# Patient Record
Sex: Female | Born: 1965
Health system: Southern US, Community
[De-identification: ages and names within clinical notes are randomized; demographics above are authoritative.]

## PROBLEM LIST (undated history)

## (undated) DIAGNOSIS — R32 Unspecified urinary incontinence: Secondary | ICD-10-CM

## (undated) DIAGNOSIS — I10 Essential (primary) hypertension: Secondary | ICD-10-CM

## (undated) DIAGNOSIS — G43909 Migraine, unspecified, not intractable, without status migrainosus: Secondary | ICD-10-CM

## (undated) DIAGNOSIS — E119 Type 2 diabetes mellitus without complications: Secondary | ICD-10-CM

## (undated) DIAGNOSIS — R51 Headache: Secondary | ICD-10-CM

## (undated) DIAGNOSIS — D509 Iron deficiency anemia, unspecified: Secondary | ICD-10-CM

## (undated) DIAGNOSIS — E7800 Pure hypercholesterolemia, unspecified: Secondary | ICD-10-CM

## (undated) DIAGNOSIS — H30033 Focal chorioretinal inflammation, peripheral, bilateral: Secondary | ICD-10-CM

## (undated) DIAGNOSIS — E0590 Thyrotoxicosis, unspecified without thyrotoxic crisis or storm: Secondary | ICD-10-CM

## (undated) HISTORY — DX: Pure hypercholesterolemia, unspecified: E78.00

## (undated) HISTORY — DX: Focal chorioretinal inflammation, peripheral, bilateral: H30.033

## (undated) HISTORY — DX: Iron deficiency anemia, unspecified: D50.9

## (undated) HISTORY — DX: Unspecified urinary incontinence: R32

## (undated) HISTORY — PX: COLONOSCOPY: SHX174

## (undated) HISTORY — DX: Headache: R51

## (undated) HISTORY — PX: APPENDECTOMY: SHX54

## (undated) HISTORY — DX: Essential (primary) hypertension: I10

## (undated) HISTORY — PX: ABDOMINAL HYSTERECTOMY: SHX81

## (undated) HISTORY — PX: TONSILLECTOMY AND ADENOIDECTOMY: SUR1326

## (undated) HISTORY — DX: Type 2 diabetes mellitus without complications: E11.9

## (undated) HISTORY — DX: Migraine, unspecified, not intractable, without status migrainosus: G43.909

---

## 1999-12-21 ENCOUNTER — Other Ambulatory Visit: Admission: RE | Admit: 1999-12-21 | Discharge: 1999-12-21 | Payer: Self-pay | Admitting: Obstetrics and Gynecology

## 2000-07-13 ENCOUNTER — Emergency Department (HOSPITAL_COMMUNITY): Admission: EM | Admit: 2000-07-13 | Discharge: 2000-07-13 | Payer: Self-pay | Admitting: Emergency Medicine

## 2000-07-13 ENCOUNTER — Encounter: Payer: Self-pay | Admitting: Emergency Medicine

## 2000-11-17 ENCOUNTER — Other Ambulatory Visit: Admission: RE | Admit: 2000-11-17 | Discharge: 2000-11-17 | Payer: Self-pay | Admitting: Obstetrics and Gynecology

## 2000-11-18 ENCOUNTER — Encounter: Admission: RE | Admit: 2000-11-18 | Discharge: 2000-11-18 | Payer: Self-pay

## 2000-11-18 ENCOUNTER — Encounter: Payer: Self-pay | Admitting: Obstetrics and Gynecology

## 2002-04-26 ENCOUNTER — Other Ambulatory Visit: Admission: RE | Admit: 2002-04-26 | Discharge: 2002-04-26 | Payer: Self-pay | Admitting: Family Medicine

## 2002-04-26 ENCOUNTER — Encounter: Admission: RE | Admit: 2002-04-26 | Discharge: 2002-04-26 | Payer: Self-pay | Admitting: Obstetrics and Gynecology

## 2002-05-04 ENCOUNTER — Ambulatory Visit (HOSPITAL_COMMUNITY): Admission: RE | Admit: 2002-05-04 | Discharge: 2002-05-04 | Payer: Self-pay | Admitting: Obstetrics and Gynecology

## 2002-05-10 ENCOUNTER — Encounter: Admission: RE | Admit: 2002-05-10 | Discharge: 2002-05-10 | Payer: Self-pay | Admitting: Obstetrics and Gynecology

## 2004-03-25 ENCOUNTER — Encounter: Admission: RE | Admit: 2004-03-25 | Discharge: 2004-03-25 | Payer: Self-pay | Admitting: Family Medicine

## 2004-05-15 ENCOUNTER — Other Ambulatory Visit: Admission: RE | Admit: 2004-05-15 | Discharge: 2004-05-15 | Payer: Self-pay | Admitting: Obstetrics and Gynecology

## 2004-08-05 ENCOUNTER — Ambulatory Visit: Payer: Self-pay | Admitting: Endocrinology

## 2004-08-07 ENCOUNTER — Emergency Department (HOSPITAL_COMMUNITY): Admission: EM | Admit: 2004-08-07 | Discharge: 2004-08-07 | Payer: Self-pay | Admitting: Emergency Medicine

## 2004-08-10 ENCOUNTER — Ambulatory Visit: Payer: Self-pay | Admitting: Internal Medicine

## 2005-02-05 ENCOUNTER — Ambulatory Visit: Payer: Self-pay | Admitting: Endocrinology

## 2005-02-09 ENCOUNTER — Ambulatory Visit: Payer: Self-pay | Admitting: Endocrinology

## 2005-02-11 ENCOUNTER — Ambulatory Visit: Payer: Self-pay | Admitting: Endocrinology

## 2005-04-07 ENCOUNTER — Ambulatory Visit: Payer: Self-pay

## 2005-06-11 ENCOUNTER — Other Ambulatory Visit: Admission: RE | Admit: 2005-06-11 | Discharge: 2005-06-11 | Payer: Self-pay | Admitting: Obstetrics and Gynecology

## 2005-06-18 ENCOUNTER — Encounter: Admission: RE | Admit: 2005-06-18 | Discharge: 2005-06-18 | Payer: Self-pay | Admitting: Obstetrics and Gynecology

## 2005-11-08 ENCOUNTER — Ambulatory Visit: Payer: Self-pay | Admitting: Endocrinology

## 2005-11-10 ENCOUNTER — Ambulatory Visit: Payer: Self-pay | Admitting: Endocrinology

## 2005-11-22 ENCOUNTER — Ambulatory Visit: Payer: Self-pay | Admitting: Internal Medicine

## 2005-11-25 ENCOUNTER — Ambulatory Visit: Payer: Self-pay | Admitting: Internal Medicine

## 2005-11-25 ENCOUNTER — Encounter (INDEPENDENT_AMBULATORY_CARE_PROVIDER_SITE_OTHER): Payer: Self-pay | Admitting: Specialist

## 2005-11-25 LAB — HM COLONOSCOPY

## 2006-06-21 ENCOUNTER — Other Ambulatory Visit: Admission: RE | Admit: 2006-06-21 | Discharge: 2006-06-21 | Payer: Self-pay | Admitting: Obstetrics and Gynecology

## 2006-09-05 ENCOUNTER — Ambulatory Visit: Payer: Self-pay | Admitting: Endocrinology

## 2006-11-02 ENCOUNTER — Ambulatory Visit: Payer: Self-pay | Admitting: Endocrinology

## 2006-11-02 LAB — CONVERTED CEMR LAB
Albumin: 3.8 g/dL (ref 3.5–5.2)
Alkaline Phosphatase: 47 units/L (ref 39–117)
BUN: 8 mg/dL (ref 6–23)
Basophils Absolute: 0.1 10*3/uL (ref 0.0–0.1)
Bilirubin Urine: NEGATIVE
Cholesterol: 143 mg/dL (ref 0–200)
Creatinine, Ser: 0.7 mg/dL (ref 0.4–1.2)
Creatinine,U: 222.7 mg/dL
Eosinophils Absolute: 0.1 10*3/uL (ref 0.0–0.6)
GFR calc Af Amer: 120 mL/min
HCT: 30.8 % — ABNORMAL LOW (ref 36.0–46.0)
HDL: 39 mg/dL (ref >39.0)
Hemoglobin, Urine: NEGATIVE
Hemoglobin: 10.2 g/dL — ABNORMAL LOW (ref 12.0–15.0)
Leukocytes, UA: NEGATIVE
Lymphocytes Relative: 38.6 % (ref 12.0–46.0)
MCHC: 33.1 g/dL (ref 30.0–36.0)
MCV: 78.4 fL (ref 78.0–100.0)
Microalb Creat Ratio: 3.6 mg/g (ref 0.0–30.0)
Microalb, Ur: 0.8 mg/dL (ref 0.0–1.9)
Monocytes Absolute: 0.4 10*3/uL (ref 0.2–0.7)
Monocytes Relative: 7.4 % (ref 3.0–11.0)
Neutro Abs: 2.8 10*3/uL (ref 1.4–7.7)
Neutrophils Relative %: 50.4 % (ref 43.0–77.0)
Potassium: 4.2 meq/L (ref 3.5–5.1)
RDW: 14.6 % (ref 11.5–14.6)
Sed Rate: 20 mm/hr (ref 0–25)
Sodium: 141 meq/L (ref 135–145)
TSH: 0.26 microintl units/mL — ABNORMAL LOW (ref 0.35–5.50)
Total Bilirubin: 0.8 mg/dL (ref 0.3–1.2)
Triglycerides: 90 mg/dL (ref 0–149)
Urobilinogen, UA: 0.2 (ref 0.0–1.0)
VLDL: 18 mg/dL (ref 0–40)
pH: 6 (ref 5.0–8.0)

## 2006-11-07 ENCOUNTER — Ambulatory Visit: Payer: Self-pay | Admitting: Endocrinology

## 2007-04-06 ENCOUNTER — Encounter: Payer: Self-pay | Admitting: Endocrinology

## 2007-04-06 DIAGNOSIS — I10 Essential (primary) hypertension: Secondary | ICD-10-CM | POA: Insufficient documentation

## 2007-04-06 DIAGNOSIS — E119 Type 2 diabetes mellitus without complications: Secondary | ICD-10-CM | POA: Insufficient documentation

## 2007-04-06 DIAGNOSIS — R32 Unspecified urinary incontinence: Secondary | ICD-10-CM | POA: Insufficient documentation

## 2007-04-06 DIAGNOSIS — E1169 Type 2 diabetes mellitus with other specified complication: Secondary | ICD-10-CM | POA: Insufficient documentation

## 2007-07-10 ENCOUNTER — Ambulatory Visit: Payer: Self-pay | Admitting: Endocrinology

## 2007-07-10 DIAGNOSIS — IMO0001 Reserved for inherently not codable concepts without codable children: Secondary | ICD-10-CM | POA: Insufficient documentation

## 2007-07-10 DIAGNOSIS — E0590 Thyrotoxicosis, unspecified without thyrotoxic crisis or storm: Secondary | ICD-10-CM | POA: Insufficient documentation

## 2007-07-11 ENCOUNTER — Telehealth (INDEPENDENT_AMBULATORY_CARE_PROVIDER_SITE_OTHER): Payer: Self-pay | Admitting: *Deleted

## 2007-07-11 LAB — CONVERTED CEMR LAB
Iron: 18 ug/dL — ABNORMAL LOW (ref 42–145)
TSH: 0.27 microintl units/mL — ABNORMAL LOW (ref 0.35–5.50)

## 2007-07-19 ENCOUNTER — Encounter: Payer: Self-pay | Admitting: Endocrinology

## 2008-03-19 ENCOUNTER — Ambulatory Visit: Payer: Self-pay | Admitting: Endocrinology

## 2008-03-19 DIAGNOSIS — R51 Headache: Secondary | ICD-10-CM | POA: Insufficient documentation

## 2008-03-19 DIAGNOSIS — R519 Headache, unspecified: Secondary | ICD-10-CM | POA: Insufficient documentation

## 2008-03-21 LAB — CONVERTED CEMR LAB: TSH: 0.54 microintl units/mL (ref 0.35–5.50)

## 2008-03-25 ENCOUNTER — Telehealth (INDEPENDENT_AMBULATORY_CARE_PROVIDER_SITE_OTHER): Payer: Self-pay | Admitting: *Deleted

## 2008-10-11 ENCOUNTER — Ambulatory Visit: Payer: Self-pay | Admitting: Endocrinology

## 2009-02-13 ENCOUNTER — Encounter: Admission: RE | Admit: 2009-02-13 | Discharge: 2009-02-13 | Payer: Self-pay | Admitting: Endocrinology

## 2009-03-21 ENCOUNTER — Ambulatory Visit: Payer: Self-pay | Admitting: Internal Medicine

## 2009-03-21 ENCOUNTER — Encounter (INDEPENDENT_AMBULATORY_CARE_PROVIDER_SITE_OTHER): Payer: Self-pay | Admitting: *Deleted

## 2009-03-21 LAB — CONVERTED CEMR LAB
ALT: 18 units/L (ref 0–35)
AST: 17 units/L (ref 0–37)
Alkaline Phosphatase: 45 units/L (ref 39–117)
Basophils Relative: 1 % (ref 0.0–3.0)
Bilirubin, Direct: 0.1 mg/dL (ref 0.0–0.3)
Blood Glucose, Fasting: 147 mg/dL
Calcium: 9.7 mg/dL (ref 8.4–10.5)
Chloride: 104 meq/L (ref 96–112)
Creatinine, Ser: 1 mg/dL (ref 0.4–1.2)
Eosinophils Relative: 2 % (ref 0.0–5.0)
Hemoglobin: 11.5 g/dL — ABNORMAL LOW (ref 12.0–15.0)
Lymphocytes Relative: 25.3 % (ref 12.0–46.0)
Magnesium: 2.3 mg/dL (ref 1.5–2.5)
Monocytes Relative: 7.1 % (ref 3.0–12.0)
Neutro Abs: 4 10*3/uL (ref 1.4–7.7)
Neutrophils Relative %: 64.6 % (ref 43.0–77.0)
RBC: 4.59 M/uL (ref 3.87–5.11)
Sodium: 138 meq/L (ref 135–145)
Specific Gravity, Urine: 1.02 (ref 1.000–1.030)
Total Protein, Urine: NEGATIVE mg/dL
Total Protein: 8.1 g/dL (ref 6.0–8.3)
Urine Glucose: NEGATIVE mg/dL
Urobilinogen, UA: 0.2 (ref 0.0–1.0)
WBC: 6.1 10*3/uL (ref 4.5–10.5)
pH: 7 (ref 5.0–8.0)

## 2010-04-03 ENCOUNTER — Encounter: Admission: RE | Admit: 2010-04-03 | Discharge: 2010-04-03 | Payer: Self-pay | Admitting: Obstetrics and Gynecology

## 2010-07-30 ENCOUNTER — Encounter: Payer: Self-pay | Admitting: Endocrinology

## 2010-07-30 ENCOUNTER — Ambulatory Visit: Payer: Self-pay | Admitting: Endocrinology

## 2010-07-30 DIAGNOSIS — D509 Iron deficiency anemia, unspecified: Secondary | ICD-10-CM | POA: Insufficient documentation

## 2010-07-30 DIAGNOSIS — E7800 Pure hypercholesterolemia, unspecified: Secondary | ICD-10-CM | POA: Insufficient documentation

## 2010-07-30 LAB — CONVERTED CEMR LAB
Basophils Relative: 1.2 % (ref 0.0–3.0)
Chloride: 104 meq/L (ref 96–112)
Cholesterol: 200 mg/dL (ref 0–200)
Eosinophils Relative: 1.8 % (ref 0.0–5.0)
HCT: 32.6 % — ABNORMAL LOW (ref 36.0–46.0)
HDL: 37.4 mg/dL — ABNORMAL LOW (ref >39.00)
Hgb A1c MFr Bld: 6.6 % — ABNORMAL HIGH (ref 4.6–6.5)
Lymphs Abs: 2.1 10*3/uL (ref 0.7–4.0)
MCHC: 34.3 g/dL (ref 30.0–36.0)
MCV: 78.6 fL (ref 78.0–100.0)
Monocytes Absolute: 0.6 10*3/uL (ref 0.1–1.0)
Nitrite: NEGATIVE
Platelets: 327 10*3/uL (ref 150.0–400.0)
Potassium: 4 meq/L (ref 3.5–5.1)
RBC: 4.14 M/uL (ref 3.87–5.11)
Sed Rate: 16 mm/hr (ref 0–22)
Specific Gravity, Urine: 1.01 (ref 1.000–1.030)
TSH: 0.51 microintl units/mL (ref 0.35–5.50)
Total Protein, Urine: NEGATIVE mg/dL
WBC: 7 10*3/uL (ref 4.5–10.5)
pH: 5 (ref 5.0–8.0)

## 2010-09-19 ENCOUNTER — Encounter: Payer: Self-pay | Admitting: Obstetrics and Gynecology

## 2010-10-01 NOTE — Progress Notes (Signed)
°  Medications Added ATENOLOL-CHLORTHALIDONE 50-25 MG TABS (ATENOLOL-CHLORTHALIDONE) Take 1/2 tablet by mouth once a day       Phone Note Call from Patient Call back at Home Phone [phone] Call back at Work Phone [phone] Call back at call [phone]   Caller: Patient Call For: dr Everardo All Summary of Call: per pt heard her lab results  on phone tree  which were fine ,but need to know what her a1c levels were per pt.Patient's chart has been  requested.    Initial call taken by: Shelbie Proctor,  July 11, 2007 10:40 AM  Follow-up for Phone Call        a1c=6.2 please continue same metformin Follow-up by: Minus Breeding MD,  July 11, 2007 12:34 PM  Additional Follow-up for Phone Call Additional follow up Details #1::        called pt  to inform left msg to call.    Additional Follow-up by: Shelbie Proctor,  July 11, 2007 1:19 PM    Additional Follow-up for Phone Call Additional follow up Details #2::    July 11, 2007 1:26 PM pt called back spoke with pt made aware what her a1c levels were  Follow-up by: Shelbie Proctor,  July 11, 2007 1:27 PM  New/Updated Medications: ATENOLOL-CHLORTHALIDONE 50-25 MG TABS (ATENOLOL-CHLORTHALIDONE) Take 1/2 tablet by mouth once a day

## 2010-10-01 NOTE — Assessment & Plan Note (Signed)
Summary: FU ON BP AND DM/NWS #   Vital Signs:  Patient profile:   45 year old female Height:      66 inches (167.64 cm) Weight:      180 pounds (81.82 kg) BMI:     29.16 O2 Sat:      97 % on Room air Temp:     97.4 degrees F (36.33 degrees C) oral Pulse rate:   80 / minute BP sitting:   124 / 82  (left arm) Cuff size:   regular  Vitals Entered By: Brenton Grills CMA Duncan Dull) (July 30, 2010 10:34 AM)  O2 Flow:  Room air CC: Follow up on DM and BP/aj Is Patient Diabetic? Yes   Primary Karalyne Nusser:  Minus Breeding MD  CC:  Follow up on DM and BP/aj.  History of Present Illness: the status of at least 3 ongoing medical problems is addressed today: headache: this has recurred off ccb.  she says it started overall approx 10-11 years ago, but it is worse recently.  dm:  pt intermittently takes her metformin.  she reports weight gain htn:  denies sob.  she takes hctz as rx'ed.   anemia:  she reports heavy menses  Current Medications (verified): 1)  Metformin Hcl 1000 Mg  Tabs (Metformin Hcl) .... Take 1 By Mouth Qd 2)  Loestrin 1/20 (21) 1-20 Mg-Mcg Tabs (Norethindrone Acet-Ethinyl Est) .Marland Kitchen.. 1 Qd 3)  Hydrochlorothiazide 25 Mg Tabs (Hydrochlorothiazide) .... One By Mouth Once Daily For High Blood Pressure  Allergies (verified): No Known Drug Allergies  Past History:  Past Medical History: Last updated: 04/06/2007 Diabetes mellitus, type II Hypertension Urinary incontinence Headaches Bloody stools  Family History: Reviewed history and no changes required. dm: both parents cancer father had colon cancer  Social History: Reviewed history from 03/21/2009 and no changes required. Occupation: works Engineer, site Married Never Smoked Alcohol use-no Drug use-no Regular exercise-yes  Review of Systems  The patient denies vision loss and chest pain.    Physical Exam  General:  normal appearance.   Head:  head: no deformity eyes: no periorbital swelling, no  proptosis external nose and ears are normal mouth: no lesion seen Ears:  TM's intact and clear with normal canals with grossly normal hearing.   Neck:  Supple without thyroid enlargement or tenderness.  Lungs:  Clear to auscultation bilaterally. Normal respiratory effort.  Heart:  Regular rate and rhythm without murmurs or gallops noted. Normal S1,S2.   Abdomen:  abdomen is soft, nontender.  no hepatosplenomegaly.   not distended.  no hernia  Pulses:  dorsalis pedis intact bilat.  no carotid bruit  Extremities:  no deformity.  no ulcer on the feet.  feet are of normal color and temp.  no edema  Neurologic:  cn 2-12 grossly intact.   readily moves all 4's.   sensation is intact to touch on the feet  Psych:  Alert and cooperative; normal mood and affect; normal attention span and concentration.   Additional Exam:  Hemoglobin A1C       [H]  6.6 %  LDL Cholesterol      [H]  272 mg/dL    Hemoglobin           [L]  11.2 g/dL                   53.6-64.4 Hematocrit           [L]  32.6 %      Iron Saturation      [  L]  10.2 %          Impression & Recommendations:  Problem # 1:  HEADACHE (ICD-784.0) Assessment Deteriorated  Problem # 2:  DIABETES MELLITUS, TYPE II (ICD-250.00) needs increased rx  Problem # 3:  ANEMIA, IRON DEFICIENCY (ICD-280.9) needs increased rx  Problem # 4:  HYPERTENSION (ICD-401.9) well-controlled  Problem # 5:  HYPERCHOLESTEROLEMIA (ICD-272.0) needs increased rx  Medications Added to Medication List This Visit: 1)  Metformin Hcl 1000 Mg Tabs (Metformin hcl) .... Take 1 by mouth two times a day  Other Orders: EKG w/ Interpretation (93000) Neurology Referral (Neuro) TLB-A1C / Hgb A1C (Glycohemoglobin) (83036-A1C) TLB-Lipid Panel (80061-LIPID) TLB-CBC Platelet - w/Differential (85025-CBCD) TLB-IBC Pnl (Iron/FE;Transferrin) (83550-IBC) TLB-B12, Serum-Total ONLY (04540-J81) TLB-TSH (Thyroid Stimulating Hormone) (84443-TSH) TLB-Sedimentation Rate (ESR)  (85652-ESR) TLB-BMP (Basic Metabolic Panel-BMET) (80048-METABOL) TLB-Udip w/ Micro (81001-URINE) Est. Patient Level IV (19147)  Preventive Care Screening     gyn is [facility]   Patient Instructions: 1)  blood tests are being ordered for you today.  please call [phone] to hear your test results. 2)  refer to a headache specialist.  you will be called with a day and time for an appointment. 3)  Please schedule a physical appointment in 6 months. 4)  cc labs to [facility] 5)  (update: i left message on phone-tree:  increase metformin to 1000 mg two times a day.  take fe 1/day.  you should take chol med.  let me know if you want fx) Prescriptions: METFORMIN HCL 1000 MG  TABS (METFORMIN HCL) take 1 by mouth two times a day  #60 x 11   Entered and Authorized by:   Minus Breeding MD   Signed by:   Minus Breeding MD on 07/31/2010   Method used:   Electronically to        [facility]* (retail)       [address]       [place], [place]  [address]       Ph: [phone]       Fax: [phone]   RxID:   [id number] HYDROCHLOROTHIAZIDE 25 MG TABS (HYDROCHLOROTHIAZIDE) One by mouth once daily for high blood pressure  #30 x 5   Entered by:   Brenton Grills CMA (AAMA)   Authorized by:   Minus Breeding MD   Signed by:   Brenton Grills CMA (AAMA) on 07/30/2010   Method used:   Electronically to        [facility]* (retail)       [address]       [place], [place]  [id number]       Ph: [phone]       Fax: [phone]   RxID:   [id number] METFORMIN HCL 1000 MG  TABS (METFORMIN HCL) take 1 by mouth qd  #30 x 5   Entered by:   Brenton Grills CMA (AAMA)   Authorized by:   Minus Breeding MD   Signed by:   Brenton Grills CMA (AAMA) on 07/30/2010   Method used:   Electronically to        [facility]* (retail)       [address]       [place], [place]  [id number]       Ph: [phone]        Fax: [phone]   RxID:   [id number]    Orders Added: 1)  EKG w/ Interpretation [  93000] 2)  Neurology Referral [Neuro] 3)  TLB-A1C / Hgb A1C (Glycohemoglobin) [83036-A1C] 4)  TLB-Lipid Panel [80061-LIPID] 5)  TLB-CBC Platelet - w/Differential [85025-CBCD] 6)  TLB-IBC Pnl (Iron/FE;Transferrin) [83550-IBC] 7)  TLB-B12, Serum-Total ONLY [82607-B12] 8)  TLB-TSH (Thyroid Stimulating Hormone) [84443-TSH] 9)  TLB-Sedimentation Rate (ESR) [85652-ESR] 10)  TLB-BMP (Basic Metabolic Panel-BMET) [80048-METABOL] 11)  TLB-Udip w/ Micro [81001-URINE] 12)  Est. Patient Level IV [16109]     Preventive Care Screening     gyn is [facility]

## 2011-01-28 ENCOUNTER — Encounter: Payer: Self-pay | Admitting: Endocrinology

## 2011-01-28 DIAGNOSIS — Z0289 Encounter for other administrative examinations: Secondary | ICD-10-CM

## 2011-06-08 ENCOUNTER — Other Ambulatory Visit (INDEPENDENT_AMBULATORY_CARE_PROVIDER_SITE_OTHER): Payer: BC Managed Care – PPO

## 2011-06-08 ENCOUNTER — Ambulatory Visit (INDEPENDENT_AMBULATORY_CARE_PROVIDER_SITE_OTHER): Payer: BC Managed Care – PPO | Admitting: Endocrinology

## 2011-06-08 ENCOUNTER — Encounter: Payer: Self-pay | Admitting: Endocrinology

## 2011-06-08 DIAGNOSIS — D509 Iron deficiency anemia, unspecified: Secondary | ICD-10-CM

## 2011-06-08 DIAGNOSIS — E119 Type 2 diabetes mellitus without complications: Secondary | ICD-10-CM

## 2011-06-08 LAB — CBC WITH DIFFERENTIAL/PLATELET
Basophils Absolute: 0.1 10*3/uL (ref 0.0–0.1)
Eosinophils Relative: 1.5 % (ref 0.0–5.0)
HCT: 34.2 % — ABNORMAL LOW (ref 36.0–46.0)
Lymphs Abs: 2.5 10*3/uL (ref 0.7–4.0)
MCV: 74.8 fl — ABNORMAL LOW (ref 78.0–100.0)
Monocytes Absolute: 0.5 10*3/uL (ref 0.1–1.0)
Neutrophils Relative %: 57.3 % (ref 43.0–77.0)
Platelets: 369 10*3/uL (ref 150.0–400.0)
RDW: 17.9 % — ABNORMAL HIGH (ref 11.5–14.6)
WBC: 7.3 10*3/uL (ref 4.5–10.5)

## 2011-06-08 LAB — HEMOGLOBIN A1C: Hgb A1c MFr Bld: 6.4 % (ref 4.6–6.5)

## 2011-06-08 MED ORDER — HYDROCHLOROTHIAZIDE 25 MG PO TABS: 25.0000 mg | ORAL_TABLET | Freq: Every day | ORAL | Status: DC

## 2011-06-08 NOTE — Patient Instructions (Addendum)
Resume hctz for blood pressure. Please come back for a regular physical appointment in 3 months. blood tests are being requested for you today.  please call [phone] to hear your test results.  You will be prompted to enter the 9-digit "MRN" number that appears at the top left of this page, followed by #.  Then you will hear the message. (update: i left message on phone-tree:  Take fe 2/d).

## 2011-06-08 NOTE — Progress Notes (Signed)
  Subjective:    Patient ID: Michelle Castillo, female    DOB: 1966-04-01, 45 y.o.   MRN: [id number]  HPI The state of at least three ongoing medical problems is addressed today: DM: denies chest pain.  no cbg record, but states cbg's are well-controlled Htn: pt says she ran out of her med approx 1 week ago.  Denies sob Anemia: Menses are not as heavy recently.  Gyn is dr Ginette Otto [facility]. Past Medical History  Diagnosis Date  . Unspecified disorder of thyroid 07/10/2007  . DIABETES MELLITUS, TYPE II 04/06/2007  . HYPERCHOLESTEROLEMIA 07/30/2010  . ANEMIA, IRON DEFICIENCY 07/30/2010  . HYPERTENSION 04/06/2007  . Headache 03/19/2008  . URINARY INCONTINENCE 04/06/2007  . Blood in stool     Past Surgical History  Procedure Date  . Cesarean section 1998  . Tonsillectomy and adenoidectomy     History   Social History  . Marital Status: Married    Spouse Name: N/A    Number of Children: N/A  . Years of Education: N/A   Occupational History  . Social Services    Social History Main Topics  . Smoking status: Never Smoker   . Smokeless tobacco: Not on file  . Alcohol Use: No  . Drug Use: No  . Sexually Active:    Other Topics Concern  . Not on file   Social History Narrative   Occupation: Works Barrister's clerk exercise-yes    Current Outpatient Prescriptions on File Prior to Visit  Medication Sig Dispense Refill  . metFORMIN (GLUCOPHAGE) 1000 MG tablet Take 1,000 mg by mouth 2 (two) times daily with a meal.        . norethindrone-ethinyl estradiol (LOESTRIN 1/20, 21,) 1-20 MG-MCG per tablet Take 1 tablet by mouth daily.          No Known Allergies  Family History  Problem Relation Age of Onset  . Diabetes Mother   . Diabetes Father   . Cancer Father     Colon Cancer    BP 112/78  Pulse 89  Temp(Src) 98.6 F (37 C) (Oral)  Ht 5\' 7"  (1.702 m)  Wt 173 lb (78.472 kg)  BMI 27.10 kg/m2  SpO2 98%  LMP 05/31/2011  Review of Systems Denies  hematuria, and brbpr.      Objective:   Physical Exam VITAL SIGNS:  See vs page GENERAL: no distress Pulses: dorsalis pedis intact bilat.   Feet: no deformity.  no ulcer on the feet.  feet are of normal color and temp.  no edema Neuro: sensation is intact to touch on the feet   Lab Results  Component Value Date   WBC 7.3 06/08/2011   HGB 11.2* 06/08/2011   HCT 34.2* 06/08/2011   MCV 74.8* 06/08/2011   PLT 369.0 06/08/2011  fe is low Lab Results  Component Value Date   HGBA1C 6.4 06/08/2011       Assessment & Plan:  iron-deficiency anemia, needs increased rx Htn, well-controlled.  The effect of hctz persists, so she needs it Dm, well-controlled

## 2011-06-09 LAB — IBC PANEL
Iron: 25 ug/dL — ABNORMAL LOW (ref 42–145)
Saturation Ratios: 5.6 % — ABNORMAL LOW (ref 20.0–50.0)

## 2012-05-25 ENCOUNTER — Other Ambulatory Visit: Payer: Self-pay | Admitting: *Deleted

## 2012-05-25 MED ORDER — METFORMIN HCL 1000 MG PO TABS: 1000.0000 mg | ORAL_TABLET | Freq: Two times a day (BID) | ORAL | Status: DC

## 2012-05-25 NOTE — Telephone Encounter (Signed)
R'cd fax from [facility] for refill of Metformin  

## 2012-06-23 ENCOUNTER — Encounter: Payer: Self-pay | Admitting: Endocrinology

## 2012-06-23 ENCOUNTER — Ambulatory Visit (INDEPENDENT_AMBULATORY_CARE_PROVIDER_SITE_OTHER): Payer: BC Managed Care – PPO | Admitting: Endocrinology

## 2012-06-23 VITALS — BP 120/80 | HR 80 | Temp 98.0°F | Resp 16 | Ht 68.0 in | Wt 179.2 lb

## 2012-06-23 DIAGNOSIS — E7800 Pure hypercholesterolemia, unspecified: Secondary | ICD-10-CM

## 2012-06-23 DIAGNOSIS — I10 Essential (primary) hypertension: Secondary | ICD-10-CM

## 2012-06-23 DIAGNOSIS — Z0000 Encounter for general adult medical examination without abnormal findings: Secondary | ICD-10-CM

## 2012-06-23 DIAGNOSIS — R32 Unspecified urinary incontinence: Secondary | ICD-10-CM

## 2012-06-23 DIAGNOSIS — E119 Type 2 diabetes mellitus without complications: Secondary | ICD-10-CM

## 2012-06-23 DIAGNOSIS — Z79899 Other long term (current) drug therapy: Secondary | ICD-10-CM

## 2012-06-23 DIAGNOSIS — E079 Disorder of thyroid, unspecified: Secondary | ICD-10-CM

## 2012-06-23 DIAGNOSIS — D509 Iron deficiency anemia, unspecified: Secondary | ICD-10-CM

## 2012-06-23 DIAGNOSIS — Z119 Encounter for screening for infectious and parasitic diseases, unspecified: Secondary | ICD-10-CM

## 2012-06-23 LAB — CBC WITH DIFFERENTIAL/PLATELET
Basophils Absolute: 0.1 10*3/uL (ref 0.0–0.1)
Basophils Relative: 2 % — ABNORMAL HIGH (ref 0–1)
HCT: 31 % — ABNORMAL LOW (ref 36.0–46.0)
Hemoglobin: 9.9 g/dL — ABNORMAL LOW (ref 12.0–15.0)
Lymphocytes Relative: 33 % (ref 12–46)
MCHC: 31.9 g/dL (ref 30.0–36.0)
Monocytes Absolute: 0.3 10*3/uL (ref 0.1–1.0)
Monocytes Relative: 6 % (ref 3–12)
Neutro Abs: 2.7 10*3/uL (ref 1.7–7.7)
Neutrophils Relative %: 56 % (ref 43–77)
RDW: 16 % — ABNORMAL HIGH (ref 11.5–15.5)
WBC: 4.8 10*3/uL (ref 4.0–10.5)

## 2012-06-23 LAB — HEPATIC FUNCTION PANEL
Alkaline Phosphatase: 41 U/L (ref 39–117)
Indirect Bilirubin: 0.3 mg/dL (ref 0.0–0.9)
Total Bilirubin: 0.4 mg/dL (ref 0.3–1.2)

## 2012-06-23 LAB — IBC PANEL
%SAT: 4 % — ABNORMAL LOW (ref 20–55)
TIBC: 420 ug/dL (ref 250–470)

## 2012-06-23 LAB — LIPID PANEL
Cholesterol: 202 mg/dL — ABNORMAL HIGH (ref 0–200)
Triglycerides: 90 mg/dL (ref <150)

## 2012-06-23 LAB — BASIC METABOLIC PANEL
CO2: 28 mEq/L (ref 19–32)
Chloride: 105 mEq/L (ref 96–112)
Potassium: 4 mEq/L (ref 3.5–5.3)
Sodium: 138 mEq/L (ref 135–145)

## 2012-06-23 LAB — HEMOGLOBIN A1C: Mean Plasma Glucose: 148 mg/dL — ABNORMAL HIGH (ref <117)

## 2012-06-23 LAB — TSH: TSH: 0.629 u[IU]/mL (ref 0.350–4.500)

## 2012-06-23 LAB — IRON: Iron: 18 ug/dL — ABNORMAL LOW (ref 42–145)

## 2012-06-23 MED ORDER — METFORMIN HCL ER 500 MG PO TB24: 1000.0000 mg | ORAL_TABLET | Freq: Two times a day (BID) | ORAL | Status: DC

## 2012-06-23 NOTE — Progress Notes (Signed)
Subjective:    Patient ID: Michelle Castillo, female    DOB: 1965/11/03, 46 y.o.   MRN: [id number]  HPI here for regular wellness examination.  she's feeling pretty well in general, and says chronic med probs are stable, except as noted below.   Past Medical History  Diagnosis Date  . Unspecified disorder of thyroid 07/10/2007  . DIABETES MELLITUS, TYPE II 04/06/2007  . HYPERCHOLESTEROLEMIA 07/30/2010  . ANEMIA, IRON DEFICIENCY 07/30/2010  . HYPERTENSION 04/06/2007  . Headache 03/19/2008  . URINARY INCONTINENCE 04/06/2007  . Blood in stool     Past Surgical History  Procedure Date  . Cesarean section 1998  . Tonsillectomy and adenoidectomy     History   Social History  . Marital Status: Divorced    Spouse Name: N/A    Number of Children: N/A  . Years of Education: N/A   Occupational History  . Social Services    Social History Main Topics  . Smoking status: Never Smoker   . Smokeless tobacco: Not on file  . Alcohol Use: No  . Drug Use: No  . Sexually Active:    Other Topics Concern  . Not on file   Social History Narrative   Occupation: Works Barrister's clerk exercise-yes    Current Outpatient Prescriptions on File Prior to Visit  Medication Sig Dispense Refill  . hydrochlorothiazide (HYDRODIURIL) 25 MG tablet Take 1 tablet (25 mg total) by mouth daily. For high blood pressure  90 tablet  3  . metFORMIN (GLUCOPHAGE) 1000 MG tablet Take 1 tablet (1,000 mg total) by mouth 2 (two) times daily with a meal.  60 tablet  2  . norethindrone-ethinyl estradiol (LOESTRIN 1/20, 21,) 1-20 MG-MCG per tablet Take 1 tablet by mouth daily.          No Known Allergies  Family History  Problem Relation Age of Onset  . Diabetes Mother   . Diabetes Father   . Cancer Father     Colon Cancer    BP 120/80  Pulse 80  Temp 98 F (36.7 C) (Oral)  Resp 16  Ht 5\' 8"  (1.727 m)  Wt 179 lb 3 oz (81.279 kg)  BMI 27.25 kg/m2  SpO2 99%  LMP 06/16/2012  Review of Systems    Constitutional: Negative for fever.       She reports weight gain  HENT: Negative for hearing loss.   Eyes: Negative for visual disturbance.  Respiratory: Negative for shortness of breath.   Cardiovascular: Negative for chest pain.  Genitourinary: Negative for hematuria.  Musculoskeletal: Positive for back pain.  Skin: Negative for rash.  Neurological: Negative for syncope.  Psychiatric/Behavioral: Negative for dysphoric mood.      Objective:   Physical Exam VS: see vs page GEN: no distress HEAD: head: no deformity eyes: no periorbital swelling, no proptosis external nose and ears are normal mouth: no lesion seen NECK: supple, thyroid is not enlarged CHEST WALL: no deformity LUNGS:  Clear to auscultation BREASTS:  sees gyn. CV: reg rate and rhythm, no murmur ABD: abdomen is soft, nontender.  no hepatosplenomegaly.  not distended.  no hernia GENITALIA/RECTAL: sees gyn MUSCULOSKELETAL: muscle bulk and strength are grossly normal.  no obvious joint swelling.  gait is normal and steady EXTEMITIES: no deformity.  no ulcer on the feet.  feet are of normal color and temp.  no edema PULSES: dorsalis pedis intact bilat.  no carotid bruit NEURO:  cn 2-12 grossly intact.   readily moves all 4's.  sensation is  intact to touch on the feet SKIN:  Normal texture and temperature.  No rash or suspicious lesion is visible.   NODES:  None palpable at the neck PSYCH: alert, oriented x3.  Does not appear anxious nor depressed.     Assessment & Plan:  Wellness visit today, with problems stable, except as noted.     SEPARATE EVALUATION FOLLOWS--EACH PROBLEM HERE IS NEW, NOT RESPONDING TO TREATMENT, OR POSES SIGNIFICANT RISK TO THE PATIENT'S HEALTH: HISTORY OF THE PRESENT ILLNESS: Dyslipidemia: denies weight change Anemia: denies brbpr PAST MEDICAL HISTORY reviewed and up to date today REVIEW OF SYSTEMS: Denies numbness PHYSICAL EXAMINATION: VITAL SIGNS:  See vs page Pulses: dorsalis  pedis intact bilat.  no carotid bruit GENERAL: no distress LAB/XRAY RESULTS: Lab Results  Component Value Date   WBC 4.8 06/23/2012   HGB 9.9* 06/23/2012   HCT 31.0* 06/23/2012   PLT 360 06/23/2012   GLUCOSE 115* 06/23/2012   CHOL 202* 06/23/2012   TRIG 90 06/23/2012   HDL 44 06/23/2012   LDLCALC 140* 06/23/2012   ALT 13 06/23/2012   AST 14 06/23/2012   NA 138 06/23/2012   K 4.0 06/23/2012   CL 105 06/23/2012   CREATININE 0.87 06/23/2012   BUN 8 06/23/2012   CO2 28 06/23/2012   TSH 0.629 06/23/2012   HGBA1C 6.8* 06/23/2012   MICROALBUR 1.26 06/23/2012  IMPRESSION: fe-deficiency anemia, needs increased rx Dyslipidemia, needs increased rx PLAN: See instruction page

## 2012-06-23 NOTE — Patient Instructions (Addendum)
please consider these measures for your health:  minimize alcohol.  do not use tobacco products.  have a colonoscopy at least every 10 years from age 44.  Women should have an annual mammogram from age 27.  keep firearms safely stored.  always use seat belts.  have working smoke alarms in your home.  see an eye doctor and dentist regularly.  never drive under the influence of alcohol or drugs (including prescription drugs).   blood tests are being requested for you today.  You will be contacted with results. Please return in 1 year. good diet and exercise habits significanly improve the control of your diabetes.  please let me know if you wish to be referred to a dietician.  high blood sugar is very risky to your health.  you should see an eye doctor every year.  You are at higher than average risk for pneumonia and hepatitis-B.  You should be vaccinated against both.

## 2012-06-24 LAB — URINALYSIS, MICROSCOPIC ONLY
Bacteria, UA: NONE SEEN
Casts: NONE SEEN
Crystals: NONE SEEN

## 2012-06-24 LAB — MICROALBUMIN / CREATININE URINE RATIO
Creatinine, Urine: 207.4 mg/dL
Microalb Creat Ratio: 6.1 mg/g (ref 0.0–30.0)

## 2012-06-24 LAB — URINALYSIS, ROUTINE W REFLEX MICROSCOPIC
Bilirubin Urine: NEGATIVE
Leukocytes, UA: NEGATIVE
Protein, ur: NEGATIVE mg/dL
Specific Gravity, Urine: 1.014 (ref 1.005–1.030)
Urobilinogen, UA: 0.2 mg/dL (ref 0.0–1.0)

## 2012-07-21 ENCOUNTER — Other Ambulatory Visit: Payer: Self-pay | Admitting: Obstetrics and Gynecology

## 2012-07-21 ENCOUNTER — Other Ambulatory Visit: Payer: Self-pay | Admitting: Certified Nurse Midwife

## 2012-07-21 DIAGNOSIS — Z1231 Encounter for screening mammogram for malignant neoplasm of breast: Secondary | ICD-10-CM

## 2012-08-01 ENCOUNTER — Other Ambulatory Visit: Payer: Self-pay | Admitting: Neurology

## 2012-08-01 MED ORDER — HYDROCHLOROTHIAZIDE 25 MG PO TABS: 25.0000 mg | ORAL_TABLET | Freq: Every day | ORAL | Status: DC

## 2012-08-17 ENCOUNTER — Ambulatory Visit: Payer: BC Managed Care – PPO

## 2012-08-22 ENCOUNTER — Other Ambulatory Visit: Payer: Self-pay | Admitting: Obstetrics and Gynecology

## 2012-11-28 ENCOUNTER — Other Ambulatory Visit: Payer: Self-pay

## 2012-11-28 DIAGNOSIS — Z1231 Encounter for screening mammogram for malignant neoplasm of breast: Secondary | ICD-10-CM

## 2013-01-04 ENCOUNTER — Ambulatory Visit
Admission: RE | Admit: 2013-01-04 | Discharge: 2013-01-04 | Disposition: A | Discharge status: Home / Self Care | Payer: 59 | Source: Ambulatory Visit

## 2013-01-04 DIAGNOSIS — Z1231 Encounter for screening mammogram for malignant neoplasm of breast: Secondary | ICD-10-CM

## 2013-03-15 ENCOUNTER — Ambulatory Visit (INDEPENDENT_AMBULATORY_CARE_PROVIDER_SITE_OTHER): Payer: 59 | Admitting: Endocrinology

## 2013-03-15 ENCOUNTER — Encounter: Payer: Self-pay | Admitting: Endocrinology

## 2013-03-15 VITALS — BP 134/76 | HR 90 | Ht 66.0 in | Wt 176.0 lb

## 2013-03-15 DIAGNOSIS — M25569 Pain in unspecified knee: Secondary | ICD-10-CM

## 2013-03-15 DIAGNOSIS — Z0000 Encounter for general adult medical examination without abnormal findings: Secondary | ICD-10-CM

## 2013-03-15 DIAGNOSIS — E119 Type 2 diabetes mellitus without complications: Secondary | ICD-10-CM

## 2013-03-15 NOTE — Progress Notes (Signed)
  Subjective:    Patient ID: Michelle Castillo, female    DOB: 1966/01/24, 47 y.o.   MRN: [id number]  HPI Pt states few mos of slight pain at the rectum, but no assoc brbpr. Pt returns for f/u of type 2 DM (dx'ed 2005; she has mild if any neuropathy of the lower extremities; no assoc chronic complications). Past Medical History  Diagnosis Date  . Unspecified disorder of thyroid 07/10/2007  . DIABETES MELLITUS, TYPE II 04/06/2007  . HYPERCHOLESTEROLEMIA 07/30/2010  . ANEMIA, IRON DEFICIENCY 07/30/2010  . HYPERTENSION 04/06/2007  . Headache(784.0) 03/19/2008  . URINARY INCONTINENCE 04/06/2007  . Blood in stool     Past Surgical History  Procedure Laterality Date  . Cesarean section  1998  . Tonsillectomy and adenoidectomy      History   Social History  . Marital Status: Divorced    Spouse Name: N/A    Number of Children: N/A  . Years of Education: N/A   Occupational History  . Social Services    Social History Main Topics  . Smoking status: Never Smoker   . Smokeless tobacco: Not on file  . Alcohol Use: No  . Drug Use: No  . Sexually Active:    Other Topics Concern  . Not on file   Social History Narrative   Occupation: Works Engineer, site   Regular exercise-yes          Current Outpatient Prescriptions on File Prior to Visit  Medication Sig Dispense Refill  . hydrochlorothiazide (HYDRODIURIL) 25 MG tablet Take 1 tablet (25 mg total) by mouth daily. For high blood pressure  90 tablet  3  . metFORMIN (GLUCOPHAGE-XR) 500 MG 24 hr tablet Take 2 tablets (1,000 mg total) by mouth 2 (two) times daily.  120 tablet  11  . norethindrone-ethinyl estradiol (LOESTRIN 1/20, 21,) 1-20 MG-MCG per tablet Take 1 tablet by mouth daily.         No current facility-administered medications on file prior to visit.   No Known Allergies  Family History  Problem Relation Age of Onset  . Diabetes Mother   . Diabetes Father   . Cancer Father     Colon Cancer   BP 134/76  Pulse 90  Ht 5'  6" (1.676 m)  Wt 176 lb (79.833 kg)  BMI 28.42 kg/m2  SpO2 98%  Review of Systems Pt has recurrence of chronic pain at both entire lower extremities (w/u was neg, more than 10 years ago).  Denies weight change.    Objective:   Physical Exam VITAL SIGNS:  See vs page GENERAL: no distress  Lab Results  Component Value Date   HGBA1C 6.9* 03/15/2013      Assessment & Plan:  Rectal pain, improved.  She is due for f/u colonoscopy, per recall letter DM: well-controlled.  nine oral agents are available for type 2 diabetes.  This regimen gives the best risk-benefit ratio.   Leg pain, recurrent, uncertain etiology

## 2013-03-15 NOTE — Patient Instructions (Addendum)
Dr Regino Schultze office will call you about the colonoscopy.  Refer to an orthopedic specialist.  you will receive a phone call, about a day and time for an appointment. blood tests are being requested for you today.  We'll contact you with results. Please come back for a regular physical appointment after 06/23/13.

## 2013-03-16 ENCOUNTER — Telehealth: Payer: Self-pay | Admitting: *Deleted

## 2013-03-16 NOTE — Telephone Encounter (Signed)
Called pt and let her know her HgbA1C was good and to continue on the same medication. Pt understood.

## 2013-03-20 ENCOUNTER — Encounter: Payer: Self-pay | Admitting: Internal Medicine

## 2013-05-11 ENCOUNTER — Ambulatory Visit (AMBULATORY_SURGERY_CENTER): Payer: Self-pay | Admitting: *Deleted

## 2013-05-11 VITALS — Ht 66.5 in | Wt 175.8 lb

## 2013-05-11 DIAGNOSIS — Z860100 Personal history of colon polyps, unspecified: Secondary | ICD-10-CM

## 2013-05-11 MED ORDER — MOVIPREP 100 G PO SOLR: 1.0000 | Freq: Once | ORAL | Status: DC

## 2013-05-11 NOTE — Progress Notes (Signed)
No egg or soy allergy. No anesthesia problems. Added to pt instructions not to take metformin the day of her procedure.

## 2013-05-16 ENCOUNTER — Encounter: Payer: Self-pay | Admitting: Internal Medicine

## 2013-05-25 ENCOUNTER — Ambulatory Visit (AMBULATORY_SURGERY_CENTER): Payer: 59 | Admitting: Internal Medicine

## 2013-05-25 ENCOUNTER — Encounter: Payer: Self-pay | Admitting: Internal Medicine

## 2013-05-25 VITALS — BP 150/66 | HR 77 | Temp 98.7°F | Resp 15 | Ht 66.5 in | Wt 175.0 lb

## 2013-05-25 DIAGNOSIS — Z800 Family history of malignant neoplasm of digestive organs: Secondary | ICD-10-CM

## 2013-05-25 DIAGNOSIS — Z86010 Personal history of colonic polyps: Secondary | ICD-10-CM

## 2013-05-25 LAB — GLUCOSE, CAPILLARY: Glucose-Capillary: 103 mg/dL — ABNORMAL HIGH (ref 70–99)

## 2013-05-25 MED ORDER — SODIUM CHLORIDE 0.9 % IV SOLN: 500.0000 mL | INTRAVENOUS | Status: DC

## 2013-05-25 NOTE — Progress Notes (Signed)
Procedure ends, to recovery, report given and VSS. 

## 2013-05-25 NOTE — Patient Instructions (Addendum)
YOU HAD AN ENDOSCOPIC PROCEDURE TODAY AT THE [facility]: Refer to the procedure report that was given to you for any specific questions about what was found during the examination.  If the procedure report does not answer your questions, please call your gastroenterologist to clarify.  If you requested that your care partner not be given the details of your procedure findings, then the procedure report has been included in a sealed envelope for you to review at your convenience later.  YOU SHOULD EXPECT: Some feelings of bloating in the abdomen. Passage of more gas than usual.  Walking can help get rid of the air that was put into your GI tract during the procedure and reduce the bloating. If you had a lower endoscopy (such as a colonoscopy or flexible sigmoidoscopy) you may notice spotting of blood in your stool or on the toilet paper. If you underwent a bowel prep for your procedure, then you may not have a normal bowel movement for a few days.  DIET: Your first meal following the procedure should be a light meal and then it is ok to progress to your normal diet.  A half-sandwich or bowl of soup is an example of a good first meal.  Heavy or fried foods are harder to digest and may make you feel nauseous or bloated.  Likewise meals heavy in dairy and vegetables can cause extra gas to form and this can also increase the bloating.  Drink plenty of fluids but you should avoid alcoholic beverages for 24 hours.  ACTIVITY: Your care partner should take you home directly after the procedure.  You should plan to take it easy, moving slowly for the rest of the day.  You can resume normal activity the day after the procedure however you should NOT DRIVE or use heavy machinery for 24 hours (because of the sedation medicines used during the test).    SYMPTOMS TO REPORT IMMEDIATELY: A gastroenterologist can be reached at any hour.  During normal business hours, 8:30 AM to 5:00 PM Monday through Friday,  call [phone].  After hours and on weekends, please call the GI answering service at [phone] who will take a message and have the physician on call contact you.   Following lower endoscopy (colonoscopy or flexible sigmoidoscopy):  Excessive amounts of blood in the stool  Significant tenderness or worsening of abdominal pains  Swelling of the abdomen that is new, acute  Fever of 100F or higher i FOLLOW UP: If any biopsies were taken you will be contacted by phone or by letter within the next 1-3 weeks.  Call your gastroenterologist if you have not heard about the biopsies in 3 weeks.  Our staff will call the home number listed on your records the next business day following your procedure to check on you and address any questions or concerns that you may have at that time regarding the information given to you following your procedure. This is a courtesy call and so if there is no answer at the home number and we have not heard from you through the emergency physician on call, we will assume that you have returned to your regular daily activities without incident.  SIGNATURES/CONFIDENTIALITY: You and/or your care partner have signed paperwork which will be entered into your electronic medical record.  These signatures attest to the fact that that the information above on your After Visit Summary has been reviewed and is understood.  Full responsibility of the confidentiality of this  discharge information lies with you and/or your care-partner.  Normal colon. High fiber diet handout given

## 2013-05-25 NOTE — Op Note (Signed)
[facility] [address] [place], [address]   COLONOSCOPY PROCEDURE REPORT  PATIENT: Michelle, Castillo  MR#: [id number] BIRTHDATE: Apr 24, 1966 , 47  yrs. old GENDER: Female ENDOSCOPIST: Hart Carwin, MD REFERRED NF:AOZHYQ colonoscopy PROCEDURE DATE:  05/25/2013 PROCEDURE:   Colonoscopy, screening First Screening Colonoscopy - Avg.  risk and is 50 yrs.  old or older - No.  Prior Negative Screening - Now for repeat screening. Above average risk  History of Adenoma - Now for follow-up colonoscopy & has been > or = to 3 yrs.  N/A  Polyps Removed Today? Yes. ASA CLASS:   Class II INDICATIONS:Patient's immediate family history of colon cancer.( parent), colon polyp 2007- "polypoid mucosa" MEDICATIONS: MAC sedation, administered by CRNA and propofol (Diprivan) 200mg  IV  DESCRIPTION OF PROCEDURE:   After the risks benefits and alternatives of the procedure were thoroughly explained, informed consent was obtained.  A digital rectal exam revealed no abnormalities of the rectum.   The LB [id number]-[id number]  endoscope was introduced through the anus and advanced to the cecum, which was identified by both the appendix and ileocecal valve. No adverse events experienced.   The quality of the prep was excellent, using MoviPrep  The instrument was then slowly withdrawn as the colon was fully examined.      COLON FINDINGS: A normal appearing cecum, ileocecal valve, and appendiceal orifice were identified.  The ascending, hepatic flexure, transverse, splenic flexure, descending, sigmoid colon and rectum appeared unremarkable.  No polyps or cancers were seen. Retroflexed views revealed no abnormalities. The time to cecum=6 minutes 26 seconds.  Withdrawal time=6 minutes 0 seconds.  The scope was withdrawn and the procedure completed. COMPLICATIONS: There were no complications.  ENDOSCOPIC IMPRESSION: Normal colon  RECOMMENDATIONS: 1.  High fiber diet 2.   recall colonoscopy  in 5 years   eSigned:  Hart Carwin, MD 05/25/2013 12:09 PM   cc:

## 2013-05-25 NOTE — Progress Notes (Signed)
Patient did not experience any of the following events: a burn prior to discharge; a fall within the facility; wrong site/side/patient/procedure/implant event; or a hospital transfer or hospital admission upon discharge from the facility. (G8907)Patient did not have preoperative order for IV antibiotic SSI prophylaxis. (G8918) ewm 

## 2013-05-28 ENCOUNTER — Telehealth: Payer: Self-pay

## 2013-05-28 NOTE — Telephone Encounter (Signed)
Left a message at [phone] for the pt to call us back if she has any questions or concerns. Maw

## 2013-07-17 ENCOUNTER — Other Ambulatory Visit: Payer: Self-pay | Admitting: Endocrinology

## 2013-10-19 ENCOUNTER — Encounter: Payer: 59 | Admitting: Endocrinology

## 2013-10-23 ENCOUNTER — Encounter: Payer: 59 | Admitting: Endocrinology

## 2013-10-29 ENCOUNTER — Encounter: Payer: 59 | Admitting: Endocrinology

## 2013-10-29 DIAGNOSIS — Z0289 Encounter for other administrative examinations: Secondary | ICD-10-CM

## 2013-12-14 ENCOUNTER — Encounter: Payer: Self-pay | Admitting: Endocrinology

## 2013-12-14 ENCOUNTER — Ambulatory Visit (INDEPENDENT_AMBULATORY_CARE_PROVIDER_SITE_OTHER): Payer: 59 | Admitting: Endocrinology

## 2013-12-14 VITALS — BP 132/70 | HR 106 | Temp 98.6°F | Ht 66.5 in | Wt 177.0 lb

## 2013-12-14 DIAGNOSIS — Z0000 Encounter for general adult medical examination without abnormal findings: Secondary | ICD-10-CM

## 2013-12-14 DIAGNOSIS — R51 Headache: Secondary | ICD-10-CM

## 2013-12-14 DIAGNOSIS — R32 Unspecified urinary incontinence: Secondary | ICD-10-CM

## 2013-12-14 DIAGNOSIS — Z79899 Other long term (current) drug therapy: Secondary | ICD-10-CM

## 2013-12-14 DIAGNOSIS — E119 Type 2 diabetes mellitus without complications: Secondary | ICD-10-CM

## 2013-12-14 DIAGNOSIS — I10 Essential (primary) hypertension: Secondary | ICD-10-CM

## 2013-12-14 DIAGNOSIS — E7800 Pure hypercholesterolemia, unspecified: Secondary | ICD-10-CM

## 2013-12-14 DIAGNOSIS — E079 Disorder of thyroid, unspecified: Secondary | ICD-10-CM

## 2013-12-14 DIAGNOSIS — D509 Iron deficiency anemia, unspecified: Secondary | ICD-10-CM

## 2013-12-14 LAB — HEPATIC FUNCTION PANEL
ALBUMIN: 4.2 g/dL (ref 3.5–5.2)
ALT: 12 U/L (ref 0–35)
AST: 17 U/L (ref 0–37)
Alkaline Phosphatase: 43 U/L (ref 39–117)
Bilirubin, Direct: 0 mg/dL (ref 0.0–0.3)
TOTAL PROTEIN: 7.4 g/dL (ref 6.0–8.3)
Total Bilirubin: 0.6 mg/dL (ref 0.3–1.2)

## 2013-12-14 LAB — URINALYSIS, ROUTINE W REFLEX MICROSCOPIC
Bilirubin Urine: NEGATIVE
Hgb urine dipstick: NEGATIVE
Ketones, ur: NEGATIVE
Leukocytes, UA: NEGATIVE
Nitrite: NEGATIVE
RBC / HPF: NONE SEEN (ref 0–?)
Specific Gravity, Urine: >=1.03 — AB (ref 1.000–1.030)
Total Protein, Urine: NEGATIVE
Urine Glucose: NEGATIVE
Urobilinogen, UA: 0.2 (ref 0.0–1.0)
pH: 5.5 (ref 5.0–8.0)

## 2013-12-14 LAB — CBC WITH DIFFERENTIAL/PLATELET
BASOS ABS: 0 10*3/uL (ref 0.0–0.1)
BASOS PCT: 0.6 % (ref 0.0–3.0)
EOS ABS: 0.3 10*3/uL (ref 0.0–0.7)
Eosinophils Relative: 5.1 % — ABNORMAL HIGH (ref 0.0–5.0)
HCT: 28.3 % — ABNORMAL LOW (ref 36.0–46.0)
HEMOGLOBIN: 8.9 g/dL — AB (ref 12.0–15.0)
LYMPHS PCT: 25.6 % (ref 12.0–46.0)
Lymphs Abs: 1.6 10*3/uL (ref 0.7–4.0)
MCHC: 31.5 g/dL (ref 30.0–36.0)
MCV: 68 fl — ABNORMAL LOW (ref 78.0–100.0)
MONOS PCT: 7.6 % (ref 3.0–12.0)
Monocytes Absolute: 0.5 10*3/uL (ref 0.1–1.0)
NEUTROS ABS: 3.8 10*3/uL (ref 1.4–7.7)
NEUTROS PCT: 61.1 % (ref 43.0–77.0)
Platelets: 361 10*3/uL (ref 150.0–400.0)
RBC: 4.16 Mil/uL (ref 3.87–5.11)
RDW: 17.3 % — ABNORMAL HIGH (ref 11.5–14.6)
WBC: 6.2 10*3/uL (ref 4.5–10.5)

## 2013-12-14 LAB — BASIC METABOLIC PANEL
BUN: 7 mg/dL (ref 6–23)
CHLORIDE: 110 meq/L (ref 96–112)
CO2: 23 mEq/L (ref 19–32)
CREATININE: 0.9 mg/dL (ref 0.4–1.2)
Calcium: 9.8 mg/dL (ref 8.4–10.5)
GFR: 90.3 mL/min (ref >60.00)
Glucose, Bld: 154 mg/dL — ABNORMAL HIGH (ref 70–99)
Potassium: 4.7 mEq/L (ref 3.5–5.1)
Sodium: 142 mEq/L (ref 135–145)

## 2013-12-14 LAB — TSH: TSH: 0.42 u[IU]/mL (ref 0.35–5.50)

## 2013-12-14 LAB — HEMOGLOBIN A1C: HEMOGLOBIN A1C: 6.6 % — AB (ref 4.6–6.5)

## 2013-12-14 LAB — MICROALBUMIN / CREATININE URINE RATIO
Creatinine,U: 70.2 mg/dL
Microalb Creat Ratio: 0.7 mg/g (ref 0.0–30.0)
Microalb, Ur: 0.5 mg/dL (ref 0.0–1.9)

## 2013-12-14 LAB — LIPID PANEL
CHOL/HDL RATIO: 4
CHOLESTEROL: 174 mg/dL (ref 0–200)
HDL: 48 mg/dL (ref >39.00)
LDL CALC: 111 mg/dL — AB (ref 0–99)
TRIGLYCERIDES: 73 mg/dL (ref 0.0–149.0)
VLDL: 14.6 mg/dL (ref 0.0–40.0)

## 2013-12-14 LAB — IBC PANEL
Iron: 16 ug/dL — ABNORMAL LOW (ref 42–145)
SATURATION RATIOS: 3.6 % — AB (ref 20.0–50.0)
TRANSFERRIN: 316.8 mg/dL (ref 212.0–360.0)

## 2013-12-14 LAB — SEDIMENTATION RATE: Sed Rate: 18 mm/hr (ref 0–22)

## 2013-12-14 MED ORDER — METFORMIN HCL ER 500 MG PO TB24: ORAL_TABLET | ORAL | Status: DC

## 2013-12-14 MED ORDER — SUMATRIPTAN SUCCINATE 100 MG PO TABS: 100.0000 mg | ORAL_TABLET | ORAL | Status: DC | PRN

## 2013-12-14 MED ORDER — OXYBUTYNIN CHLORIDE 5 MG PO TABS: 5.0000 mg | ORAL_TABLET | Freq: Two times a day (BID) | ORAL | Status: DC

## 2013-12-14 MED ORDER — HYDROCHLOROTHIAZIDE 25 MG PO TABS: 25.0000 mg | ORAL_TABLET | Freq: Every day | ORAL | Status: DC

## 2013-12-14 NOTE — Patient Instructions (Addendum)
i have sent 2 prescriptions to your pharmacy: headache and incontinence.   please consider these measures for your health:  minimize alcohol.  do not use tobacco products.  have a colonoscopy at least every 10 years from age 46.  Women should have an annual mammogram from age 65.  keep firearms safely stored.  always use seat belts.  have working smoke alarms in your home.  see an eye doctor and dentist regularly.  never drive under the influence of alcohol or drugs (including prescription drugs).   blood tests are being requested for you today.  We'll contact you with results. Please return in 1 year.

## 2013-12-14 NOTE — Progress Notes (Signed)
Subjective:    Patient ID: Michelle Castillo, female    DOB: 06-09-1966, 48 y.o.   MRN: [id number]  HPI Pt is here for regular wellness examination, and is feeling pretty well in general, and says chronic med probs are stable, except as noted below Past Medical History  Diagnosis Date  . Unspecified disorder of thyroid 07/10/2007  . DIABETES MELLITUS, TYPE II 04/06/2007  . HYPERCHOLESTEROLEMIA 07/30/2010  . ANEMIA, IRON DEFICIENCY 07/30/2010  . HYPERTENSION 04/06/2007  . Headache(784.0) 03/19/2008  . URINARY INCONTINENCE 04/06/2007  . Blood in stool     Past Surgical History  Procedure Laterality Date  . Cesarean section  1998  . Tonsillectomy and adenoidectomy      History   Social History  . Marital Status: Divorced    Spouse Name: N/A    Number of Children: N/A  . Years of Education: N/A   Occupational History  . Social Services    Social History Main Topics  . Smoking status: Never Smoker   . Smokeless tobacco: Never Used  . Alcohol Use: Yes     Comment: occasionally   . Drug Use: No  . Sexual Activity: Not on file   Other Topics Concern  . Not on file   Social History Narrative   Occupation: Works Orthoptist   Regular exercise-yes          No current outpatient prescriptions on file prior to visit.   No current facility-administered medications on file prior to visit.    No Known Allergies  Family History  Problem Relation Age of Onset  . Diabetes Mother   . Diabetes Father   . Cancer Father     Colon Cancer  . Colon cancer Father 60    2002    BP 132/70  Pulse 106  Temp(Src) 98.6 F (37 C) (Oral)  Ht 5' 6.5" (1.689 m)  Wt 177 lb (80.287 kg)  BMI 28.14 kg/m2  SpO2 93%  Review of Systems  Constitutional: Negative for fever.  HENT: Negative for hearing loss.   Eyes: Negative for visual disturbance.  Respiratory: Negative for shortness of breath.   Cardiovascular: Negative for chest pain.  Gastrointestinal: Negative for anal bleeding.    Endocrine: Negative for cold intolerance.  Genitourinary: Negative for hematuria.  Musculoskeletal: Negative for back pain.  Skin: Negative for rash.  Allergic/Immunologic: Negative for environmental allergies.  Neurological: Negative for numbness.  Hematological: Does not bruise/bleed easily.  Psychiatric/Behavioral: Negative for dysphoric mood.       Objective:   Physical Exam VS: see vs page GEN: no distress HEAD: head: no deformity eyes: no periorbital swelling, no proptosis external nose and ears are normal mouth: no lesion seen NECK: supple, thyroid is not enlarged CHEST WALL: no deformity LUNGS:  Clear to auscultation BREASTS: sees gyn ABD: abdomen is soft, nontender.  no hepatosplenomegaly.  not distended.  no hernia GENITALIA/RECTAL: sees gyn MUSCULOSKELETAL: muscle bulk and strength are grossly normal.  no obvious joint swelling.  gait is normal and steady PULSES: no carotid bruit NEURO:  cn 2-12 grossly intact.   readily moves all 4's.  SKIN:  Normal texture and temperature.  No rash or suspicious lesion is visible.   NODES:  None palpable at the neck PSYCH: alert, well-oriented.  Does not appear anxious nor depressed.   i reviewed electrocardiogram.    Assessment & Plan:  Wellness visit today, with problems stable, except as noted.    SEPARATE EVALUATION FOLLOWS--EACH PROBLEM HERE IS NEW, NOT  RESPONDING TO TREATMENT, OR POSES SIGNIFICANT RISK TO THE PATIENT'S HEALTH: HISTORY OF THE PRESENT ILLNESS: Headache persists.  Motrin does not help.   Urinary incontinence persists intermittently. She has not been on medication for this She has high deductible, and declines ref to specialists.   PAST MEDICAL HISTORY reviewed and up to date today REVIEW OF SYSTEMS: Denies syncope and weight change PHYSICAL EXAMINATION: VITAL SIGNS:  See vs page GENERAL: no distress HEART:  Regular rate and rhythm without murmurs noted. Normal S1,S2.   LAB/XRAY RESULTS: Lab  Results  Component Value Date   CHOL 174 12/14/2013   HDL 48.00 12/14/2013   LDLCALC 111* 12/14/2013   TRIG 73.0 12/14/2013   CHOLHDL 4 12/14/2013   Lab Results  Component Value Date   WBC 6.2 12/14/2013   HGB 8.9* 12/14/2013   HCT 28.3* 12/14/2013   MCV 68.0 Repeated and verified X2.* 12/14/2013   PLT 361.0 12/14/2013  IMPRESSION: fe-deficiency anemia: worse Dyslipidemia: in view of DM, she should be on rx Headache: uncertain etiology.  She needs rx Urinary incontinence: she needs rx PLAN: See instruction page

## 2013-12-17 ENCOUNTER — Telehealth: Payer: Self-pay | Admitting: *Deleted

## 2013-12-17 NOTE — Telephone Encounter (Signed)
Scheduled on 12/25/13 at 8:45 AM. Left a message for patient to call me.Spoke with patient and moved OV to 12/28/13 at 2:00 PM.

## 2013-12-17 NOTE — Telephone Encounter (Signed)
Message copied by Hulan Saas on Mon Dec 17, 2013  8:37 AM ------      Message from: [name]      Created: Fri Dec 14, 2013 11:19 PM       Hilliard Clark, thanks for letting me know. It looks like chronic iron def. Still menstruating. We will call her, I think OV first ( I want to check hemoccult) .If she cannot keep up with oral Iron, she may benefit from iron infusion.I will let you know.if EGD  And/or capsule endoscopy for low grade GIB. Thanx Lynn Ito, Can we schedule pt for an OV? thanx      ----- Message -----         From: Renato Shin, MD         Sent: 12/14/2013   6:55 PM           To: [name], MD            Sydell Axon            Given how bad her anemia is, should she have an upper endoscopy?  If so, could you please call her for this.            Thanks.            Hilliard Clark       ------

## 2013-12-20 ENCOUNTER — Encounter: Payer: Self-pay | Admitting: *Deleted

## 2013-12-25 ENCOUNTER — Ambulatory Visit: Payer: 59 | Admitting: Internal Medicine

## 2013-12-27 ENCOUNTER — Encounter: Payer: Self-pay | Admitting: Certified Nurse Midwife

## 2013-12-28 ENCOUNTER — Encounter: Payer: Self-pay | Admitting: Certified Nurse Midwife

## 2013-12-28 ENCOUNTER — Encounter: Payer: Self-pay | Admitting: Internal Medicine

## 2013-12-28 ENCOUNTER — Ambulatory Visit (INDEPENDENT_AMBULATORY_CARE_PROVIDER_SITE_OTHER): Payer: 59 | Admitting: Certified Nurse Midwife

## 2013-12-28 ENCOUNTER — Ambulatory Visit (INDEPENDENT_AMBULATORY_CARE_PROVIDER_SITE_OTHER): Payer: 59 | Admitting: Internal Medicine

## 2013-12-28 VITALS — BP 112/70 | HR 70 | Resp 16 | Ht 65.75 in | Wt 174.0 lb

## 2013-12-28 VITALS — BP 128/90 | HR 84 | Ht 66.0 in | Wt 175.5 lb

## 2013-12-28 DIAGNOSIS — Z01419 Encounter for gynecological examination (general) (routine) without abnormal findings: Secondary | ICD-10-CM

## 2013-12-28 DIAGNOSIS — D509 Iron deficiency anemia, unspecified: Secondary | ICD-10-CM

## 2013-12-28 DIAGNOSIS — D649 Anemia, unspecified: Secondary | ICD-10-CM

## 2013-12-28 DIAGNOSIS — Z0000 Encounter for general adult medical examination without abnormal findings: Secondary | ICD-10-CM

## 2013-12-28 NOTE — Progress Notes (Signed)
Michelle Castillo 12/25/1965 [id number]  Note: This dictation was prepared with Dragon digital system. Any transcriptional errors that result from this procedure are unintentional.   History of Present Illness:  This is a 48 year old African American female with severe iron deficiency anemia who is currently on iron supplements. She has heavy menses which consists of at least 3 days of heavy flow and 5 days of lighter flow. She has always been anemic. In 2010, her hemoglobin was 11.2, hematocrit was 32.6 and in 2011 hemoglobin was 11.2, hematocrit was 34.2. In 2014, her hemoglobin was 9.9 with MCV of 70. Last week, her hemoglobin was 8.9 with an MCV of 68. She had a 3.6% iron saturation with a serum iron of 16. She just started taking iron but it makes her constipated and she doesn't like it. We did a colonoscopy in October 2014 which was a normal exam. She denies any dysphagia or dyspepsia. She has on occasion seen bright red blood when she wipes. There is no family history of colon cancer.    Past Medical History  Diagnosis Date  . Unspecified disorder of thyroid   . DIABETES MELLITUS, TYPE II   . HYPERCHOLESTEROLEMIA   . ANEMIA, IRON DEFICIENCY   . HYPERTENSION   . Headache(784.0)   . URINARY INCONTINENCE   . Migraines   . Abnormal Pap smear of cervix 10-02-07    ASCUS  . Fibroid     Past Surgical History  Procedure Laterality Date  . Cesarean section  1998  . Tonsillectomy and adenoidectomy    . Appendectomy      No Known Allergies  Family history and social history have been reviewed.  Review of Systems:   The remainder of the 10 point ROS is negative except as outlined in the H&P  Physical Exam: General Appearance Well developed, in no distress Eyes  Non icteric  HEENT  Non traumatic, normocephalic  Mouth No lesion, tongue papillated, no cheilosis Neck Supple without adenopathy, thyroid not enlarged, no carotid bruits, no JVD Lungs Clear to auscultation bilaterally COR  Normal S1, normal S2, regular rhythm, no murmur, quiet precordium Abdomen soft nontender Rectal small amount of Hemoccult negative stool Extremities  No pedal edema Skin No lesions Neurological Alert and oriented x 3 Psychological Normal mood and affect  Assessment and Plan:   Problem #28 48 year old premenopausal female with severe iron deficiency anemia and heavy menses. She just had a normal colonoscopy within the last 6 months. She is partially intolerant to oral iron. We will set her up for an iron infusion of feraheme. She agrees with the plan. We will recheck a CBC within 3 weeks after the infusion. We will follow her H&H every 6-8 weeks until it returns completely to normal. In the meantime, she will continue iron supplements. No indication for EGD in absence of symptoms.    [name] 12/28/2013

## 2013-12-28 NOTE — Patient Instructions (Signed)

## 2013-12-28 NOTE — Patient Instructions (Addendum)
You have been scheduled for a feraheme infusion at [facility] on Thursday, 01/03/14 at 1:00 pm and Wednesday, 01/09/14 @ 2:00 pm. If you need to reschedule, you may call [phone].  CC: Dr Loanne Drilling

## 2013-12-28 NOTE — Progress Notes (Signed)
48 y.o. G2P0011 Divorced African American Fe here for annual exam. Periods are always painful on first day of cycle and heavier with 7 day duration. "So tired of this"  Patient's last menstrual period was 12/16/2013.          Sexually active: yes  The current method of family planning is none.    Exercising: yes  walking Smoker:  no  Health Maintenance: Pap:  03-22-12 neg HPV HR neg MMG: 01-04-13 normal Colonoscopy:  04/2013 BMD:   none TDaP: 2000? Labs: none Self breast exam: done occ   reports that she has never smoked. She has never used smokeless tobacco. She reports that she drinks alcohol. She reports that she does not use illicit drugs.  Past Medical History  Diagnosis Date  . Unspecified disorder of thyroid   . DIABETES MELLITUS, TYPE II   . HYPERCHOLESTEROLEMIA   . ANEMIA, IRON DEFICIENCY   . HYPERTENSION   . Headache(784.0)   . URINARY INCONTINENCE   . Migraines   . Abnormal Pap smear of cervix 10-02-07    ASCUS  . Fibroid     Past Surgical History  Procedure Laterality Date  . Cesarean section  1998  . Tonsillectomy and adenoidectomy    . Appendectomy      Current Outpatient Prescriptions  Medication Sig Dispense Refill  . hydrochlorothiazide (HYDRODIURIL) 25 MG tablet Take 1 tablet (25 mg total) by mouth daily. For high blood pressure  90 tablet  3  . metFORMIN (GLUCOPHAGE-XR) 500 MG 24 hr tablet TAKE (2) TABLETS TWICE DAILY.  120 tablet  7  . oxybutynin (DITROPAN) 5 MG tablet Take 1 tablet (5 mg total) by mouth 2 (two) times daily.  60 tablet  11  . SUMAtriptan (IMITREX) 100 MG tablet Take 1 tablet (100 mg total) by mouth every 2 (two) hours as needed for migraine or headache. May repeat in 2 hours if headache persists or recurs.  10 tablet  0   No current facility-administered medications for this visit.    Family History  Problem Relation Age of Onset  . Diabetes Mother   . Thyroid disease Mother   . Hypertension Mother   . Diabetes Father   . Colon  cancer Father 44    2002  . Hypertension Father   . Stroke Father   . Heart disease Father     stints  . Breast cancer Maternal Aunt   . Breast cancer Maternal Aunt   . Breast cancer Maternal Aunt   . Breast cancer Maternal Aunt   . Breast cancer Maternal Aunt     ROS:  Pertinent items are noted in HPI.  Otherwise, a comprehensive ROS was negative.  Exam:   BP 112/70  Pulse 70  Resp 16  Ht 5' 5.75" (1.67 m)  Wt 174 lb (78.926 kg)  BMI 28.30 kg/m2  LMP 12/16/2013 Height: 5' 5.75" (167 cm)  Ht Readings from Last 3 Encounters:  12/28/13 5' 5.75" (1.67 m)  12/28/13 5\' 6"  (1.676 m)  12/14/13 5' 6.5" (1.689 m)    General appearance: alert, cooperative and appears stated age Head: Normocephalic, without obvious abnormality, atraumatic Neck: no adenopathy, supple, symmetrical, trachea midline and thyroid normal to inspection and palpation and non-palpable Lungs: clear to auscultation bilaterally Breasts: normal appearance, no masses or tenderness, No nipple retraction or dimpling, No nipple discharge or bleeding, No axillary or supraclavicular adenopathy Heart: regular rate and rhythm Abdomen: soft, non-tender; no masses,  no organomegaly Extremities: extremities normal, atraumatic,  no cyanosis or edema Skin: Skin color, texture, turgor normal. No rashes or lesions Lymph nodes: Cervical, supraclavicular, and axillary nodes normal. No abnormal inguinal nodes palpated Neurologic: Grossly normal   Pelvic: External genitalia:  no lesions              Urethra:  normal appearing urethra with no masses, tenderness or lesions              Bartholin's and Skene's: normal                 Vagina: normal appearing vagina with normal color and discharge, no lesions wet prep taken, ph 4.0              Cervix: normal, non tender              Pap taken: yes Bimanual Exam:  Uterus:  firm and  slight nodular feel and mid positon              Adnexa: normal adnexa and no mass, fullness,  tenderness               Rectovaginal: Confirms               Anus:  normal sphincter tone, no lesions  A:  Well Woman with normal exam  Contraception condoms or abstinence  Dysmenorrhea with Menorrhagia   Anemia being followed by PCP,hematology  STD screening  History of fibroids  Type 2 Diabetes good control with PCP per patient  Elevated cholesterol with PCP management  P:   Reviewed health and wellness pertinent to exam  Discussed options for management with Mirena IUD if possible with fibroid history, ablation. Discussed risks/benefits/expectations of both. Discussed need for PUS to evaluate bleeding concerns and evaluate for use of either discussed. Patient would like to proceed. Discussed possible need for SGM or endometrial biopsy also. Patient agreeable.Discussed insurance personnel will precert and notify patient of information and will be scheduled. Discussed Anemia should improve if bleeding under control also and biopsy negative.  Continue follow up with PCP as indicated for medical issues.  Lab:GC,Chlamydia, wet prep for Trichomonas negative    Pap smear taken today with HPV reflex  Mammogram yearly Discussed importance of exercise, and diet control for health issues. Discussed STD prevention.  return annually or prn  An After Visit Summary was printed and given to the patient.

## 2013-12-29 ENCOUNTER — Encounter: Payer: Self-pay | Admitting: Internal Medicine

## 2013-12-30 NOTE — Progress Notes (Signed)
Needs SHGM with endometrial biopsy but MUST be timed with cycle as pt is using nothing for [facility]. Reviewed personally.  Felipa Emory, MD.

## 2014-01-01 ENCOUNTER — Other Ambulatory Visit: Payer: Self-pay | Admitting: Certified Nurse Midwife

## 2014-01-01 DIAGNOSIS — N920 Excessive and frequent menstruation with regular cycle: Secondary | ICD-10-CM

## 2014-01-02 ENCOUNTER — Telehealth: Payer: Self-pay | Admitting: Certified Nurse Midwife

## 2014-01-02 LAB — IPS PAP TEST WITH REFLEX TO HPV

## 2014-01-02 LAB — IPS N GONORRHOEA AND CHLAMYDIA BY PCR

## 2014-01-02 NOTE — Telephone Encounter (Signed)
Left message for patient to call back. Need to go over benefits for [facility] and [facility] and schedule.

## 2014-01-03 ENCOUNTER — Encounter (HOSPITAL_COMMUNITY)
Admission: RE | Admit: 2014-01-03 | Discharge: 2014-01-03 | Disposition: A | Discharge status: Home / Self Care | Payer: 59 | Source: Ambulatory Visit | Attending: Internal Medicine | Admitting: Internal Medicine

## 2014-01-03 ENCOUNTER — Encounter (HOSPITAL_COMMUNITY): Payer: Self-pay

## 2014-01-03 VITALS — BP 129/85 | HR 84 | Temp 98.0°F | Resp 20

## 2014-01-03 DIAGNOSIS — D649 Anemia, unspecified: Secondary | ICD-10-CM

## 2014-01-03 MED ORDER — FERUMOXYTOL INJECTION 510 MG/17 ML
510.0000 mg | Freq: Once | INTRAVENOUS | Status: AC
  Administered 2014-01-03: 510 mg via INTRAVENOUS
  Filled 2014-01-03: qty 17

## 2014-01-03 MED ORDER — FERUMOXYTOL INJECTION 510 MG/17 ML: 510.0000 mg | INTRAVENOUS | Status: DC

## 2014-01-03 MED ORDER — SODIUM CHLORIDE 0.9 % IV SOLN
INTRAVENOUS | Status: DC
  Administered 2014-01-03: 13:00:00 via INTRAVENOUS

## 2014-01-03 NOTE — Telephone Encounter (Signed)
Spoke with patient. Advised that per benefits quote received, her patient liability for [facility] and [facility] will be $1644.37. This is due to the patient not having satisfied her $3225 calendar year deductible. Patient states that she cannot have the services rendered being that she will have such a large out of pocket responsibility and requests a call from the provider to advise of alternative treatment.

## 2014-01-03 NOTE — Discharge Instructions (Signed)

## 2014-01-04 ENCOUNTER — Encounter: Payer: Self-pay | Admitting: Certified Nurse Midwife

## 2014-01-09 ENCOUNTER — Encounter (HOSPITAL_COMMUNITY)
Admission: RE | Admit: 2014-01-09 | Discharge: 2014-01-09 | Disposition: A | Discharge status: Home / Self Care | Payer: 59 | Source: Ambulatory Visit | Attending: Internal Medicine | Admitting: Internal Medicine

## 2014-01-09 ENCOUNTER — Encounter (HOSPITAL_COMMUNITY): Payer: Self-pay

## 2014-01-09 MED ORDER — SODIUM CHLORIDE 0.9 % IV SOLN
510.0000 mg | Freq: Once | INTRAVENOUS | Status: AC
  Administered 2014-01-09: 510 mg via INTRAVENOUS
  Filled 2014-01-09: qty 17

## 2014-01-09 MED ORDER — SODIUM CHLORIDE 0.9 % IV SOLN
INTRAVENOUS | Status: DC
  Administered 2014-01-09: 15:00:00 via INTRAVENOUS

## 2014-01-09 NOTE — Discharge Instructions (Signed)

## 2014-01-09 NOTE — Progress Notes (Signed)
Tolerated Feraheme infusion well. No signs of adverse reaction noted. Instructed to call Dr. Nichola Sizer office for questions and concerns following discharge from Short Stay. Verbalized understanding.

## 2014-01-15 ENCOUNTER — Telehealth: Payer: Self-pay | Admitting: Certified Nurse Midwife

## 2014-01-15 NOTE — Telephone Encounter (Signed)
LMTCB

## 2014-01-17 ENCOUNTER — Telehealth: Payer: Self-pay | Admitting: Certified Nurse Midwife

## 2014-01-17 DIAGNOSIS — N920 Excessive and frequent menstruation with regular cycle: Secondary | ICD-10-CM

## 2014-01-17 MED ORDER — NORETHINDRONE 0.35 MG PO TABS: 1.0000 | ORAL_TABLET | Freq: Every day | ORAL | Status: DC

## 2014-01-17 NOTE — Telephone Encounter (Signed)
Spoke with patient regarding Micronor option to control heavy bleeding with period which is contributed to anemia. Patient wanted to consider ablation, but unable to do financially at present. Discussed benefits and risks of Micronor, mechanism of action, bleeding expectations and need to take consistently for ultimate benefit and contraception also. Questions addressed. Patient would like to try. Patient on menses now, so instructed to start on now . Rx Micronor sent to [facility] . Patient to call after one month use for update.

## 2014-02-17 ENCOUNTER — Other Ambulatory Visit: Payer: Self-pay | Admitting: Endocrinology

## 2014-02-25 ENCOUNTER — Other Ambulatory Visit: Payer: Self-pay

## 2014-02-25 DIAGNOSIS — Z1231 Encounter for screening mammogram for malignant neoplasm of breast: Secondary | ICD-10-CM

## 2014-03-15 ENCOUNTER — Ambulatory Visit: Payer: 59

## 2014-03-21 ENCOUNTER — Ambulatory Visit
Admission: RE | Admit: 2014-03-21 | Discharge: 2014-03-21 | Disposition: A | Discharge status: Home / Self Care | Payer: 59 | Source: Ambulatory Visit

## 2014-03-21 DIAGNOSIS — Z1231 Encounter for screening mammogram for malignant neoplasm of breast: Secondary | ICD-10-CM

## 2014-04-05 ENCOUNTER — Telehealth: Payer: Self-pay | Admitting: Certified Nurse Midwife

## 2014-04-05 NOTE — Telephone Encounter (Signed)
I called patient to offer payment plan addressed in the following account note from [employer]: We could ask the patient to pay 1/3 up front and pay the remainder over three months? [facility] [id number]/OV Allowed amount:: $1070.04 1/3= $356.68. The patient said that her policy will change in September and she would like to wait until because she believes there will be less out of pocket expense.

## 2014-04-08 NOTE — Telephone Encounter (Signed)
Yes this is fine.

## 2014-04-09 NOTE — Telephone Encounter (Signed)
° °  Recall entered for due date 04/30/14 recall date 04/13/14 for a general procedure.

## 2014-04-09 NOTE — Telephone Encounter (Signed)
Let's check with other in September ?recall,

## 2014-04-09 NOTE — Telephone Encounter (Signed)
Thank you :)

## 2014-04-09 NOTE — Telephone Encounter (Signed)
Michelle Castillo wanted me to ask you to send this patient a recall letter. Her situation is that her insurance will be new in September and she wants to wait until then to have procedures done. We will need to check her benefits at that time. Is there a letter that fits this scenario? Thank you

## 2014-04-09 NOTE — Telephone Encounter (Signed)
i fff

## 2014-05-20 ENCOUNTER — Telehealth: Payer: Self-pay | Admitting: Obstetrics & Gynecology

## 2014-05-20 NOTE — Telephone Encounter (Signed)
Left message for patient to call back. Need to discuss scheduling SHGM. Also need to know if patient has new coverage info.

## 2014-06-06 ENCOUNTER — Encounter: Payer: Self-pay | Admitting: Internal Medicine

## 2014-06-10 ENCOUNTER — Encounter: Payer: Self-pay | Admitting: Certified Nurse Midwife

## 2014-06-11 ENCOUNTER — Telehealth: Payer: Self-pay | Admitting: Emergency Medicine

## 2014-06-11 NOTE — Telephone Encounter (Signed)
[facility] MESSAGE REPORT Message [[id number]]    From KHALIYAH NORTHROP   To Regina Eck, CNM [P [phone]]   Composed 06/10/2014 9:31 AM   For Delivery On 06/10/2014 9:31 AM   Subject Non-Urgent Medical Question   Message Type Patient Medical Advice Request   Read Status Y   Message Body Hi,   I continue to experience very uncomfortable cramps and heavy bleeding. Is there another birth control pill I can try as well as menstrual cramps medication. It appears that over the last several months the cramps and bleeding are worse. I had to take off of work today due cramping.              Responsible Party

## 2014-06-11 NOTE — Telephone Encounter (Signed)
Message left to return call to [place] at [phone].   Patient with hx of iron deficiency anemia.  On Micronor.  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  H/O fibroids and Dysmenorrhea with Menorrhagia

## 2014-06-11 NOTE — Telephone Encounter (Signed)
Message copied by Michele Mcalpine on Tue Jun 11, 2014  4:20 PM ------      Message from: Regina Eck      Created: Tue Jun 11, 2014  2:41 PM       Please call patient needs ov to assess ------

## 2014-06-12 NOTE — Telephone Encounter (Signed)
Called patient again. Left message.  Mychart message sent as well.

## 2014-06-13 NOTE — Telephone Encounter (Signed)
Can we send letter regarding need to follow up with problem

## 2014-06-13 NOTE — Telephone Encounter (Signed)
Message left to return call to [place] at [phone].   Attempted phone call x 3. My chart message sent. Patient has not read.    cc Lamont Snowball, RN Nursing supervisor. Routing to Regina Eck CNM for next step.

## 2014-06-18 ENCOUNTER — Encounter: Payer: Self-pay | Admitting: Emergency Medicine

## 2014-06-18 NOTE — Telephone Encounter (Signed)
Letter printed and sent.  

## 2014-06-27 NOTE — Telephone Encounter (Signed)
Dr. Sabra Heck,  Attempted triage after initial Mychart message received for cramps and heavy bleeding. Message x 3 left, letter via [place] mail sent, mychart message sent (patient has not read) .  Only one contact number available in chart and on designated party release form.   Okay to close encounter?

## 2014-06-27 NOTE — Telephone Encounter (Signed)
OK to remove from in box.  Encounter closed.

## 2014-07-01 ENCOUNTER — Encounter (HOSPITAL_COMMUNITY): Payer: Self-pay

## 2014-12-27 ENCOUNTER — Other Ambulatory Visit: Payer: Self-pay | Admitting: Endocrinology

## 2014-12-27 NOTE — Telephone Encounter (Signed)
Please advise if ok to refill. Patient has not been seen since 12/13/2015. Thanks!

## 2014-12-27 NOTE — Telephone Encounter (Signed)
Rx sent to pharmacy   

## 2014-12-27 NOTE — Telephone Encounter (Signed)
Please refill x 1 Ov is due  

## 2015-02-06 ENCOUNTER — Telehealth: Payer: Self-pay | Admitting: Endocrinology

## 2015-02-06 MED ORDER — HYDROCHLOROTHIAZIDE 25 MG PO TABS: ORAL_TABLET | ORAL | Status: DC

## 2015-02-06 NOTE — Telephone Encounter (Signed)
Please call in BP med for pt to her pharmacy [place]

## 2015-02-24 ENCOUNTER — Other Ambulatory Visit: Payer: Self-pay

## 2015-03-11 ENCOUNTER — Ambulatory Visit (INDEPENDENT_AMBULATORY_CARE_PROVIDER_SITE_OTHER): Payer: 59 | Admitting: Endocrinology

## 2015-03-11 ENCOUNTER — Encounter: Payer: Self-pay | Admitting: Endocrinology

## 2015-03-11 VITALS — BP 130/82 | HR 81 | Temp 98.8°F | Resp 15 | Ht 67.0 in | Wt 180.0 lb

## 2015-03-11 DIAGNOSIS — D509 Iron deficiency anemia, unspecified: Secondary | ICD-10-CM | POA: Diagnosis not present

## 2015-03-11 DIAGNOSIS — E119 Type 2 diabetes mellitus without complications: Secondary | ICD-10-CM | POA: Diagnosis not present

## 2015-03-11 DIAGNOSIS — Z0000 Encounter for general adult medical examination without abnormal findings: Secondary | ICD-10-CM | POA: Diagnosis not present

## 2015-03-11 LAB — CBC WITH DIFFERENTIAL/PLATELET
Basophils Absolute: 0.1 10*3/uL (ref 0.0–0.1)
Basophils Relative: 1.4 % (ref 0.0–3.0)
EOS PCT: 2.4 % (ref 0.0–5.0)
Eosinophils Absolute: 0.1 10*3/uL (ref 0.0–0.7)
HCT: 31.1 % — ABNORMAL LOW (ref 36.0–46.0)
Hemoglobin: 10.1 g/dL — ABNORMAL LOW (ref 12.0–15.0)
Lymphocytes Relative: 29.8 % (ref 12.0–46.0)
Lymphs Abs: 1.5 10*3/uL (ref 0.7–4.0)
MCHC: 32.4 g/dL (ref 30.0–36.0)
MCV: 74.7 fl — ABNORMAL LOW (ref 78.0–100.0)
MONO ABS: 0.3 10*3/uL (ref 0.1–1.0)
Monocytes Relative: 6.3 % (ref 3.0–12.0)
NEUTROS ABS: 3 10*3/uL (ref 1.4–7.7)
Neutrophils Relative %: 60.1 % (ref 43.0–77.0)
Platelets: 365 10*3/uL (ref 150.0–400.0)
RBC: 4.17 Mil/uL (ref 3.87–5.11)
RDW: 14.5 % (ref 11.5–15.5)
WBC: 4.9 10*3/uL (ref 4.0–10.5)

## 2015-03-11 LAB — BASIC METABOLIC PANEL
BUN: 7 mg/dL (ref 6–23)
CALCIUM: 9.4 mg/dL (ref 8.4–10.5)
CO2: 28 meq/L (ref 19–32)
CREATININE: 0.84 mg/dL (ref 0.40–1.20)
Chloride: 105 mEq/L (ref 96–112)
GFR: 93.52 mL/min (ref >60.00)
GLUCOSE: 136 mg/dL — AB (ref 70–99)
POTASSIUM: 3.8 meq/L (ref 3.5–5.1)
Sodium: 138 mEq/L (ref 135–145)

## 2015-03-11 LAB — MICROALBUMIN / CREATININE URINE RATIO
CREATININE, U: 224.9 mg/dL
MICROALB UR: 0.8 mg/dL (ref 0.0–1.9)
Microalb Creat Ratio: 0.4 mg/g (ref 0.0–30.0)

## 2015-03-11 LAB — URINALYSIS, ROUTINE W REFLEX MICROSCOPIC
Bilirubin Urine: NEGATIVE
Hgb urine dipstick: NEGATIVE
Ketones, ur: NEGATIVE
Leukocytes, UA: NEGATIVE
NITRITE: NEGATIVE
PH: 7.5 (ref 5.0–8.0)
RBC / HPF: NONE SEEN (ref 0–?)
Specific Gravity, Urine: 1.015 (ref 1.000–1.030)
Total Protein, Urine: NEGATIVE
Urine Glucose: NEGATIVE
Urobilinogen, UA: 0.2 (ref 0.0–1.0)

## 2015-03-11 LAB — HEPATIC FUNCTION PANEL
ALBUMIN: 4.1 g/dL (ref 3.5–5.2)
ALK PHOS: 41 U/L (ref 39–117)
ALT: 12 U/L (ref 0–35)
AST: 12 U/L (ref 0–37)
BILIRUBIN DIRECT: 0.1 mg/dL (ref 0.0–0.3)
TOTAL PROTEIN: 7 g/dL (ref 6.0–8.3)
Total Bilirubin: 0.4 mg/dL (ref 0.2–1.2)

## 2015-03-11 LAB — IBC PANEL
Iron: 21 ug/dL — ABNORMAL LOW (ref 42–145)
Saturation Ratios: 5.4 % — ABNORMAL LOW (ref 20.0–50.0)
Transferrin: 280 mg/dL (ref 212.0–360.0)

## 2015-03-11 LAB — LIPID PANEL
Cholesterol: 196 mg/dL (ref 0–200)
HDL: 40.8 mg/dL (ref >39.00)
LDL Cholesterol: 126 mg/dL — ABNORMAL HIGH (ref 0–99)
NonHDL: 155.2
TRIGLYCERIDES: 146 mg/dL (ref 0.0–149.0)
Total CHOL/HDL Ratio: 5
VLDL: 29.2 mg/dL (ref 0.0–40.0)

## 2015-03-11 LAB — HEMOGLOBIN A1C: Hgb A1c MFr Bld: 6.8 % — ABNORMAL HIGH (ref 4.6–6.5)

## 2015-03-11 LAB — TSH: TSH: 0.59 u[IU]/mL (ref 0.35–4.50)

## 2015-03-11 NOTE — Progress Notes (Signed)
Subjective:    Patient ID: Michelle Castillo, female    DOB: 1966/02/28, 49 y.o.   MRN: [id number]  HPI Pt is here for regular wellness examination, and is feeling pretty well in general, and says chronic med probs are stable, except as noted below Past Medical History  Diagnosis Date  . Unspecified disorder of thyroid   . DIABETES MELLITUS, TYPE II   . HYPERCHOLESTEROLEMIA   . ANEMIA, IRON DEFICIENCY   . HYPERTENSION   . Headache(784.0)   . URINARY INCONTINENCE   . Migraines   . Abnormal Pap smear of cervix 10-02-07    ASCUS  . Fibroid     Past Surgical History  Procedure Laterality Date  . Cesarean section  1998  . Tonsillectomy and adenoidectomy    . Appendectomy      History   Social History  . Marital Status: Divorced    Spouse Name: N/A  . Number of Children: 1  . Years of Education: N/A   Occupational History  . Social Services    Social History Main Topics  . Smoking status: Never Smoker   . Smokeless tobacco: Never Used  . Alcohol Use: Yes     Comment: occasionally 2 a month  . Drug Use: No  . Sexual Activity:    Partners: Male    Birth Control/ Protection: None   Other Topics Concern  . Not on file   Social History Narrative   Occupation: Works Orthoptist   Regular exercise-yes          Current Outpatient Prescriptions on File Prior to Visit  Medication Sig Dispense Refill  . hydrochlorothiazide (HYDRODIURIL) 25 MG tablet Take 1 tablet daily. *APPOINTMENT NEEDED FOR FURTHER REFILLS* LAST REFILL** 30 tablet 0  . metFORMIN (GLUCOPHAGE-XR) 500 MG 24 hr tablet Take 2 tablets twice daily. *APPOINTMENT NEEDED FOR FURTHER REFILLS* 120 tablet 0  . norethindrone (MICRONOR,CAMILA,ERRIN) 0.35 MG tablet Take 1 tablet (0.35 mg total) by mouth daily. 1 Package 12  . oxybutynin (DITROPAN) 5 MG tablet Take 1 tablet (5 mg total) by mouth 2 (two) times daily. 60 tablet 11  . SUMAtriptan (IMITREX) 100 MG tablet TAKE 1 TABLET AT ONSET-MAY REPEAT IN 2 HOURS AS  NEEDED- NO MORE THAN 2 IN 24 HOURS. 10 tablet 2   No current facility-administered medications on file prior to visit.    No Known Allergies  Family History  Problem Relation Age of Onset  . Diabetes Mother   . Thyroid disease Mother   . Hypertension Mother   . Diabetes Father   . Colon cancer Father 67    2002  . Hypertension Father   . Stroke Father   . Heart disease Father     stints  . Breast cancer Maternal Aunt   . Breast cancer Maternal Aunt   . Breast cancer Maternal Aunt   . Breast cancer Maternal Aunt   . Breast cancer Maternal Aunt     BP 130/82 mmHg  Pulse 81  Temp(Src) 98.8 F (37.1 C) (Oral)  Resp 15  Ht 5\' 7"  (1.702 m)  Wt 180 lb (81.647 kg)  BMI 28.19 kg/m2  SpO2 98%  LMP 03/04/2015  Review of Systems  Constitutional: Negative for fever and unexpected weight change.  HENT: Negative for hearing loss.   Eyes: Negative for visual disturbance.  Respiratory: Negative for shortness of breath.   Cardiovascular: Negative for chest pain.  Endocrine: Negative for cold intolerance.  Genitourinary: Negative for hematuria.  Musculoskeletal:  Negative for back pain.  Skin: Negative for rash.  Allergic/Immunologic: Negative for environmental allergies.  Neurological: Negative for syncope and numbness.  Hematological: Does not bruise/bleed easily.  Psychiatric/Behavioral: Negative for dysphoric mood.       Objective:   Physical Exam VS: see vs page GEN: no distress HEAD: head: no deformity eyes: no periorbital swelling, no proptosis external nose and ears are normal mouth: no lesion seen NECK: supple, thyroid is not enlarged CHEST WALL: no deformity LUNGS:  Clear to auscultation BREASTS: sees gyn.  ABD: abdomen is soft, nontender.  no hepatosplenomegaly.  not distended.  no hernia GENITALIA/RECTAL: sees gyn MUSCULOSKELETAL: muscle bulk and strength are grossly normal.  no obvious joint swelling.  gait is normal and steady EXTEMITIES: no deformity.   no ulcer on the feet.  feet are of normal color and temp.  no edema PULSES: dorsalis pedis intact bilat.  no carotid bruit NEURO:  cn 2-12 grossly intact.   readily moves all 4's.  sensation is intact to touch on the feet SKIN:  Normal texture and temperature.  No rash or suspicious lesion is visible.   NODES:  None palpable at the neck PSYCH: alert, well-oriented.  Does not appear anxious nor depressed.   i personally reviewed electrocardiogram tracing: normal.      Assessment & Plan:  Wellness visit today, with problems stable, except as noted.   SEPARATE EVALUATION FOLLOWS--EACH PROBLEM HERE IS NEW, NOT RESPONDING TO TREATMENT, OR POSES SIGNIFICANT RISK TO THE PATIENT'S HEALTH: HISTORY OF THE PRESENT ILLNESS: She does not want to get iv-fe again, as it was very expensive.  She denies menorrhagia.   PAST MEDICAL HISTORY reviewed and up to date today REVIEW OF SYSTEMS: Denies fatigue. PHYSICAL EXAMINATION: VITAL SIGNS:  See vs page GENERAL: no distress HEART:  Regular rate and rhythm without murmurs noted. Normal S1,S2.   LAB/XRAY RESULTS: Lab Results  Component Value Date   WBC 4.9 03/11/2015   HGB 10.1* 03/11/2015   HCT 31.1* 03/11/2015   MCV 74.7* 03/11/2015   PLT 365.0 03/11/2015  IMPRESSION: anemia: persistent PLAN:  Please take iron, 1 pill, twice a day

## 2015-03-11 NOTE — Patient Instructions (Signed)
please consider these measures for your health:  minimize alcohol.  do not use tobacco products.  have a colonoscopy at least every 10 years from age 49.  Women should have an annual mammogram from age 41.  keep firearms safely stored.  always use seat belts.  have working smoke alarms in your home.  see an eye doctor and dentist regularly.  never drive under the influence of alcohol or drugs (including prescription drugs).   blood tests are requested for you today.  We'll let you know about the results.

## 2015-03-12 ENCOUNTER — Other Ambulatory Visit: Payer: Self-pay | Admitting: Endocrinology

## 2015-03-12 MED ORDER — FERROUS SULFATE 27 MG PO TABS
1.0000 | ORAL_TABLET | Freq: Two times a day (BID) | ORAL | Status: DC
Start: 2015-03-12 — End: 2015-05-07

## 2015-03-17 ENCOUNTER — Encounter: Payer: Self-pay | Admitting: Endocrinology

## 2015-03-17 ENCOUNTER — Other Ambulatory Visit: Payer: Self-pay

## 2015-03-17 MED ORDER — HYDROCHLOROTHIAZIDE 25 MG PO TABS: ORAL_TABLET | ORAL | Status: DC

## 2015-03-17 MED ORDER — OXYBUTYNIN CHLORIDE 5 MG PO TABS: 5.0000 mg | ORAL_TABLET | Freq: Two times a day (BID) | ORAL | Status: DC

## 2015-03-17 MED ORDER — SUMATRIPTAN SUCCINATE 100 MG PO TABS: ORAL_TABLET | ORAL | Status: DC

## 2015-04-04 ENCOUNTER — Other Ambulatory Visit: Payer: Self-pay

## 2015-04-04 ENCOUNTER — Encounter: Payer: Self-pay | Admitting: Endocrinology

## 2015-04-04 MED ORDER — METFORMIN HCL ER 500 MG PO TB24: ORAL_TABLET | ORAL | Status: DC

## 2015-05-07 ENCOUNTER — Ambulatory Visit (INDEPENDENT_AMBULATORY_CARE_PROVIDER_SITE_OTHER): Payer: 59 | Admitting: Certified Nurse Midwife

## 2015-05-07 ENCOUNTER — Encounter: Payer: Self-pay | Admitting: Certified Nurse Midwife

## 2015-05-07 VITALS — BP 120/80 | HR 72 | Resp 16 | Ht 66.5 in | Wt 178.0 lb

## 2015-05-07 DIAGNOSIS — Z113 Encounter for screening for infections with a predominantly sexual mode of transmission: Secondary | ICD-10-CM

## 2015-05-07 DIAGNOSIS — N946 Dysmenorrhea, unspecified: Secondary | ICD-10-CM

## 2015-05-07 DIAGNOSIS — Z01419 Encounter for gynecological examination (general) (routine) without abnormal findings: Secondary | ICD-10-CM | POA: Diagnosis not present

## 2015-05-07 DIAGNOSIS — N898 Other specified noninflammatory disorders of vagina: Secondary | ICD-10-CM | POA: Diagnosis not present

## 2015-05-07 DIAGNOSIS — Z23 Encounter for immunization: Secondary | ICD-10-CM

## 2015-05-07 NOTE — Progress Notes (Signed)
49 y.o. G2P0011 Divorced  African American Fe here for annual exam. Periods are less heavy, but cramping has increased using OTC medications with relief. STD screening desired. Recent visit with Dr. Loanne Drilling PCP for diabetes, hypertension/migraine management/labs/urinary urgency management and aex All medications stable at present..  Continues to feel right vaginal wall cyst, no change in size or any pain with. No other health issues today. Son started first year of college!  Patient's last menstrual period was 04/28/2015.          Sexually active: Yes.    The current method of family planning is withdrawal method.    Exercising: Yes.    walking & jogging Smoker:  no  Health Maintenance: Pap: 12-28-13 neg MMG: 03-21-14 category c density,birads 1:neg Colonoscopy: 9/14 f/u 46yrs BMD:   none TDaP:  2000? Labs: pcp Self breast exam: done occ   reports that she has never smoked. She has never used smokeless tobacco. She reports that she drinks alcohol. She reports that she does not use illicit drugs.  Past Medical History  Diagnosis Date  . Unspecified disorder of thyroid   . DIABETES MELLITUS, TYPE II   . HYPERCHOLESTEROLEMIA   . ANEMIA, IRON DEFICIENCY   . HYPERTENSION   . Headache(784.0)   . URINARY INCONTINENCE   . Migraines   . Abnormal Pap smear of cervix 10-02-07    ASCUS  . Fibroid     Past Surgical History  Procedure Laterality Date  . Cesarean section  1998  . Tonsillectomy and adenoidectomy    . Appendectomy      Current Outpatient Prescriptions  Medication Sig Dispense Refill  . Ferrous Sulfate 27 MG TABS Take 1 tablet (27 mg total) by mouth 2 (two) times daily. 100 tablet 11  . hydrochlorothiazide (HYDRODIURIL) 25 MG tablet Take 1 tablet daily. 30 tablet 11  . metFORMIN (GLUCOPHAGE-XR) 500 MG 24 hr tablet Take 2 tablets twice daily. 120 tablet 2  . norethindrone (MICRONOR,CAMILA,ERRIN) 0.35 MG tablet Take 1 tablet (0.35 mg total) by mouth daily. 1 Package 12  .  oxybutynin (DITROPAN) 5 MG tablet Take 1 tablet (5 mg total) by mouth 2 (two) times daily. 60 tablet 11  . SUMAtriptan (IMITREX) 100 MG tablet TAKE 1 TABLET AT ONSET-MAY REPEAT IN 2 HOURS AS NEEDED- NO MORE THAN 2 IN 24 HOURS. 30 tablet 2   No current facility-administered medications for this visit.    Family History  Problem Relation Age of Onset  . Diabetes Mother   . Thyroid disease Mother   . Hypertension Mother   . Diabetes Father   . Colon cancer Father 27    2002  . Hypertension Father   . Stroke Father   . Heart disease Father     stints  . Breast cancer Maternal Aunt   . Breast cancer Maternal Aunt   . Breast cancer Maternal Aunt   . Breast cancer Maternal Aunt   . Breast cancer Maternal Aunt     ROS:  Pertinent items are noted in HPI.  Otherwise, a comprehensive ROS was negative.  Exam:   BP 120/80 mmHg  Pulse 72  Resp 16  Ht 5' 6.5" (1.689 m)  Wt 178 lb (80.74 kg)  BMI 28.30 kg/m2  LMP 04/28/2015 Height: 5' 6.5" (168.9 cm) Ht Readings from Last 3 Encounters:  05/07/15 5' 6.5" (1.689 m)  03/11/15 5\' 7"  (1.702 m)  12/28/13 5' 5.75" (1.67 m)    General appearance: alert, cooperative and appears stated age  Head: Normocephalic, without obvious abnormality, atraumatic Neck: no adenopathy, supple, symmetrical, trachea midline and thyroid normal to inspection and palpation Lungs: clear to auscultation bilaterally Breasts: normal appearance, no masses or tenderness, No nipple retraction or dimpling, No nipple discharge or bleeding, No axillary or supraclavicular adenopathy Heart: regular rate and rhythm Abdomen: soft, non-tender; no masses,  no organomegaly Extremities: extremities normal, atraumatic, no cyanosis or edema Skin: Skin color, texture, turgor normal. No rashes or lesions Lymph nodes: Cervical, supraclavicular, and axillary nodes normal. No abnormal inguinal nodes palpated Neurologic: Grossly normal   Pelvic: External genitalia:  no lesions               Urethra:  normal appearing urethra with no masses, tenderness or lesions              Bartholin's and Skene's: normal                 Vagina: normal appearing vagina with normal color and discharge, no lesions, small vaginal wall cyst on right, non tender, soft, mobile              Cervix: normal,non tender, no lesions              Pap taken: No. Bimanual Exam:  Uterus:  enlarged, 8 week size, nodular feel C/W history of fibroids weeks size and non tender No size change from previous exam              Adnexa: normal adnexa and no mass, fullness, tenderness               Rectovaginal: Confirms               Anus:  normal sphincter tone, no lesions  Chaperone present: yes  A:  Well Woman with normal exam  Dysmenorrhea with OTC medication relief  Right vaginal wall cyst,small not symptomatic  History of fibroids with no uterine size change  STD screening  Immunization due  Hypertension/Type 2 Diabetes/Urinary urgency/Migraine headaches with PCP management with stable medication.  P:   Reviewed health and wellness pertinent to exam  Discussed Alieve use at onset of any cramping and repeat in 11 hours to see if maintains better control. Will try.   Discussed to advise if any change and will need OV. Voiced understanding.  Lab: GC,Chlamydia  Requests TDAP  Continue follow up with PCP as indicated  Pap smear as above   counseled on breast self exam, mammography screening, STD prevention, HIV risk factors and prevention, adequate intake of calcium and vitamin D, diet and exercise  return annually or prn  An After Visit Summary was printed and given to the patient.

## 2015-05-07 NOTE — Patient Instructions (Signed)
EXERCISE AND DIET:  We recommended that you start or continue a regular exercise program for good health. Regular exercise means any activity that makes your heart beat faster and makes you sweat.  We recommend exercising at least 30 minutes per day at least 3 days a week, preferably 4 or 5.  We also recommend a diet low in fat and sugar.  Inactivity, poor dietary choices and obesity can cause diabetes, heart attack, stroke, and kidney damage, among others.    ALCOHOL AND SMOKING:  Women should limit their alcohol intake to no more than 7 drinks/beers/glasses of wine (combined, not each!) per week. Moderation of alcohol intake to this level decreases your risk of breast cancer and liver damage. And of course, no recreational drugs are part of a healthy lifestyle.  And absolutely no smoking or even second hand smoke. Most people know smoking can cause heart and lung diseases, but did you know it also contributes to weakening of your bones? Aging of your skin?  Yellowing of your teeth and nails?  CALCIUM AND VITAMIN D:  Adequate intake of calcium and Vitamin D are recommended.  The recommendations for exact amounts of these supplements seem to change often, but generally speaking 600 mg of calcium (either carbonate or citrate) and 800 units of Vitamin D per day seems prudent. Certain women may benefit from higher intake of Vitamin D.  If you are among these women, your doctor will have told you during your visit.    PAP SMEARS:  Pap smears, to check for cervical cancer or precancers,  have traditionally been done yearly, although recent scientific advances have shown that most women can have pap smears less often.  However, every woman still should have a physical exam from her gynecologist every year. It will include a breast check, inspection of the vulva and vagina to check for abnormal growths or skin changes, a visual exam of the cervix, and then an exam to evaluate the size and shape of the uterus and  ovaries.  And after 47 years of age, a rectal exam is indicated to check for rectal cancers. We will also provide age appropriate advice regarding health maintenance, like when you should have certain vaccines, screening for sexually transmitted diseases, bone density testing, colonoscopy, mammograms, etc.   MAMMOGRAMS:  All women over 40 years old should have a yearly mammogram. Many facilities now offer a "3D" mammogram, which may cost around $50 extra out of pocket. If possible,  we recommend you accept the option to have the 3D mammogram performed.  It both reduces the number of women who will be called back for extra views which then turn out to be normal, and it is better than the routine mammogram at detecting truly abnormal areas.    COLONOSCOPY:  Colonoscopy to screen for colon cancer is recommended for all women at age 50.  We know, you hate the idea of the prep.  We agree, BUT, having colon cancer and not knowing it is worse!!  Colon cancer so often starts as a polyp that can be seen and removed at colonscopy, which can quite literally save your life!  And if your first colonoscopy is normal and you have no family history of colon cancer, most women don't have to have it again for 10 years.  Once every ten years, you can do something that may end up saving your life, right?  We will be happy to help you get it scheduled when you are ready.    Be sure to check your insurance coverage so you understand how much it will cost.  It may be covered as a preventative service at no cost, but you should check your particular policy.     Dysmenorrhea Dysmenorrhea is pain during a menstrual period. You will have pain in the lower belly (abdomen). The pain is caused by the tightening (contracting) of the muscles of the uterus. The pain can be minor or severe. Headache, feeling sick to your stomach (nausea), throwing up (vomiting), or low back pain may occur with this condition. HOME CARE  Only take medicine  as told by your doctor.  Place a heating pad or hot water bottle on your lower back or belly. Do not sleep with a heating pad.  Exercise may help lessen the pain.  Massage the lower back or belly.  Stop smoking.  Avoid alcohol and caffeine. GET HELP IF:   Your pain does not get better with medicine.  You have pain during sex.  Your pain gets worse while taking pain medicine.  Your period bleeding is heavier than normal.  You keep feeling sick to your stomach or keep throwing up. GET HELP RIGHT AWAY IF: You pass out (faint). Document Released: 11/12/2008 Document Revised: 08/21/2013 Document Reviewed: 02/01/2013 [facility] Patient Information 2015 [place], [place]. This information is not intended to replace advice given to you by your health care provider. Make sure you discuss any questions you have with your health care provider.

## 2015-05-07 NOTE — Progress Notes (Signed)
Reviewed personally.  M. Suzanne Jovi Zavadil, MD.  

## 2015-05-13 NOTE — Addendum Note (Signed)
Addended by: Regina Eck on: 05/13/2015 10:56 AM   Modules accepted: Orders, SmartSet

## 2015-05-16 LAB — IPS N GONORRHOEA AND CHLAMYDIA BY PCR

## 2015-05-16 NOTE — Addendum Note (Signed)
Addended by: Regina Eck on: 05/16/2015 04:46 PM   Modules accepted: Miquel Dunn

## 2015-05-20 ENCOUNTER — Other Ambulatory Visit: Payer: Self-pay

## 2015-05-20 DIAGNOSIS — Z1231 Encounter for screening mammogram for malignant neoplasm of breast: Secondary | ICD-10-CM

## 2015-05-23 ENCOUNTER — Ambulatory Visit
Admission: RE | Admit: 2015-05-23 | Discharge: 2015-05-23 | Disposition: A | Discharge status: Home / Self Care | Payer: 59 | Source: Ambulatory Visit

## 2015-05-23 DIAGNOSIS — Z1231 Encounter for screening mammogram for malignant neoplasm of breast: Secondary | ICD-10-CM

## 2015-05-27 ENCOUNTER — Other Ambulatory Visit: Payer: Self-pay | Admitting: Certified Nurse Midwife

## 2015-05-27 DIAGNOSIS — R928 Other abnormal and inconclusive findings on diagnostic imaging of breast: Secondary | ICD-10-CM

## 2015-06-02 ENCOUNTER — Ambulatory Visit
Admission: RE | Admit: 2015-06-02 | Discharge: 2015-06-02 | Disposition: A | Discharge status: Home / Self Care | Payer: 59 | Source: Ambulatory Visit | Attending: Certified Nurse Midwife | Admitting: Certified Nurse Midwife

## 2015-06-02 DIAGNOSIS — R928 Other abnormal and inconclusive findings on diagnostic imaging of breast: Secondary | ICD-10-CM

## 2015-07-11 ENCOUNTER — Encounter: Payer: Self-pay | Admitting: Certified Nurse Midwife

## 2015-07-14 ENCOUNTER — Telehealth: Payer: Self-pay | Admitting: Emergency Medicine

## 2015-07-14 NOTE — Telephone Encounter (Signed)
Chief Complaint  Patient presents with  . Advice Only    Patient sent mychart message with request to start OCP.    RE: Non-Urgent Medical Question    07/14/2015 8:13 AM    To: Bary Castilla     From: Michele Mcalpine, RN     Created: 07/14/2015 8:13 AM      -------------------------------------------------------------------------------- Ms. June Leap,   My name is Olivia Mackie and I am a triage nurse for Debbi. If you would like to start a new medication, you will need to come in for an office visit to discuss this with Debbi. She will need to be able to review risks and benefits of medications and you both can decide what will work best for you. Please call our office at your convenience to schedule an appointment to discuss your request. You may call [phone].    Sincerely,   Karen Chafe, BSN RN-BC Triage Nurse [place] [address], [place], [place] [address] [facility]:  [phone]; Fax:  [phone]   ----- Message -----    From: Bary Castilla    Sent: 07/11/2015 10:02 PM EST      To: Melvia Heaps, CNM Subject: Non-Urgent Medical Question  Hi,  I wanted to know if I could get a prescription for birth controls pills.  If you able to do so I wanted to be prescribe the type where you only get your cycle 4 times a year.

## 2015-07-14 NOTE — Telephone Encounter (Signed)
Responded to patient via mychart and telephone encounter opened.

## 2015-08-04 ENCOUNTER — Encounter: Payer: Self-pay | Admitting: Endocrinology

## 2015-08-05 ENCOUNTER — Other Ambulatory Visit: Payer: Self-pay

## 2015-08-05 MED ORDER — METFORMIN HCL ER 500 MG PO TB24
ORAL_TABLET | ORAL | Status: DC
Start: 2015-08-05 — End: 2016-03-31

## 2015-08-05 NOTE — Telephone Encounter (Signed)
Patient read mychart message and did not call office to schedule office visit.  Okay to close ?

## 2015-08-05 NOTE — Telephone Encounter (Signed)
yes

## 2016-03-31 ENCOUNTER — Other Ambulatory Visit: Payer: Self-pay | Admitting: Endocrinology

## 2016-06-24 ENCOUNTER — Other Ambulatory Visit: Payer: Self-pay | Admitting: Endocrinology

## 2016-06-24 NOTE — Telephone Encounter (Signed)
Please refill x 1 cpx is due 

## 2016-06-28 ENCOUNTER — Encounter: Payer: Self-pay | Admitting: Endocrinology

## 2016-06-28 ENCOUNTER — Ambulatory Visit (INDEPENDENT_AMBULATORY_CARE_PROVIDER_SITE_OTHER): Payer: 59 | Admitting: Endocrinology

## 2016-06-28 ENCOUNTER — Other Ambulatory Visit: Payer: Self-pay

## 2016-06-28 VITALS — BP 134/82 | HR 93 | Ht 66.5 in | Wt 175.0 lb

## 2016-06-28 DIAGNOSIS — D509 Iron deficiency anemia, unspecified: Secondary | ICD-10-CM

## 2016-06-28 DIAGNOSIS — Z0000 Encounter for general adult medical examination without abnormal findings: Secondary | ICD-10-CM

## 2016-06-28 DIAGNOSIS — E119 Type 2 diabetes mellitus without complications: Secondary | ICD-10-CM | POA: Diagnosis not present

## 2016-06-28 MED ORDER — METFORMIN HCL ER 500 MG PO TB24: ORAL_TABLET | ORAL | 11 refills | Status: DC

## 2016-06-28 MED ORDER — OXYBUTYNIN CHLORIDE 5 MG PO TABS: 5.0000 mg | ORAL_TABLET | Freq: Two times a day (BID) | ORAL | 11 refills | Status: DC

## 2016-06-28 MED ORDER — SUMATRIPTAN SUCCINATE 100 MG PO TABS
ORAL_TABLET | ORAL | 2 refills | Status: DC
Start: 2016-06-28 — End: 2017-07-18

## 2016-06-28 MED ORDER — GLUCOSE BLOOD VI STRP: ORAL_STRIP | 2 refills | Status: DC

## 2016-06-28 MED ORDER — HYDROCHLOROTHIAZIDE 25 MG PO TABS: 25.0000 mg | ORAL_TABLET | Freq: Every day | ORAL | 11 refills | Status: DC

## 2016-06-28 NOTE — Patient Instructions (Signed)
Please consider these measures for your health:  minimize alcohol.  Do not use tobacco products.  Have a colonoscopy at least every 10 years from age 48.  Women should have an annual mammogram from age 60.  Keep firearms safely stored.  Always use seat belts.  have working smoke alarms in your home.  See an eye doctor and dentist regularly.  Never drive under the influence of alcohol or drugs (including prescription drugs).   blood tests are requested for you today.  We'll let you know about the results.  Please come back in 1 year.

## 2016-06-28 NOTE — Progress Notes (Signed)
Subjective:    Patient ID: Michelle Castillo, female    DOB: 12-13-1965, 50 y.o.   MRN: [id number]  HPI Pt is here for regular wellness examination, and is feeling pretty well in general, and says chronic med probs are stable, except as noted below Past Medical History:  Diagnosis Date  . Abnormal Pap smear of cervix 10-02-07   ASCUS  . ANEMIA, IRON DEFICIENCY   . DIABETES MELLITUS, TYPE II   . Fibroid   . Headache(784.0)   . HYPERCHOLESTEROLEMIA   . HYPERTENSION   . Migraines   . Unspecified disorder of thyroid   . URINARY INCONTINENCE     Past Surgical History:  Procedure Laterality Date  . APPENDECTOMY    . CESAREAN SECTION  1998  . TONSILLECTOMY AND ADENOIDECTOMY      Social History   Social History  . Marital status: Divorced    Spouse name: N/A  . Number of children: 1  . Years of education: N/A   Occupational History  . [employer]   Social History Main Topics  . Smoking status: Never Smoker  . Smokeless tobacco: Never Used  . Alcohol use No  . Drug use: No  . Sexual activity: Yes    Partners: Male    Birth control/ protection: None     Comment: withdrawal   Other Topics Concern  . Not on file   Social History Narrative   Occupation: Works Orthoptist   Regular exercise-yes          Current Outpatient Prescriptions on File Prior to Visit  Medication Sig Dispense Refill  . IRON PO Take by mouth daily.     No current facility-administered medications on file prior to visit.     No Known Allergies  Family History  Problem Relation Age of Onset  . Diabetes Mother   . Thyroid disease Mother   . Hypertension Mother   . Diabetes Father   . Colon cancer Father 28    2002  . Hypertension Father   . Stroke Father   . Heart disease Father     stints  . Breast cancer Maternal Aunt   . Breast cancer Maternal Aunt   . Breast cancer Maternal Aunt   . Breast cancer Maternal Aunt   . Breast cancer Maternal Aunt      BP 134/82   Pulse 93   Ht 5' 6.5" (1.689 m)   Wt 175 lb (79.4 kg)   SpO2 98%   BMI 27.82 kg/m     Review of Systems  Constitutional: Negative for fever.  HENT: Negative for hearing loss.   Eyes: Negative for visual disturbance.  Respiratory: Negative for shortness of breath.   Cardiovascular: Negative for chest pain.  Gastrointestinal: Negative for blood in stool.  Endocrine: Negative for cold intolerance.  Genitourinary: Negative for hematuria.  Musculoskeletal: Negative for back pain.  Skin: Negative for rash.  Allergic/Immunologic: Negative for environmental allergies.  Neurological: Negative for syncope and numbness.  Hematological: Does not bruise/bleed easily.  Psychiatric/Behavioral: Negative for dysphoric mood.       Objective:   Physical Exam VS: see vs page GEN: no distress HEAD: head: no deformity eyes: no periorbital swelling, no proptosis. external nose and ears are normal mouth: no lesion seen NECK: supple, thyroid is not enlarged CHEST WALL: no deformity LUNGS:  Clear to auscultation BREASTS: sees gyn CV: reg rate and rhythm, no murmur ABD: abdomen is soft, nontender.  no  hepatosplenomegaly.  not distended.  no hernia GENITALIA/RECTAL: sees gyn MUSCULOSKELETAL: muscle bulk and strength are grossly normal.  no obvious joint swelling.  gait is normal and steady EXTEMITIES: no deformity.  no ulcer on the feet.  feet are of normal color and temp.  no edema PULSES: dorsalis pedis intact bilat.  no carotid bruit NEURO:  cn 2-12 grossly intact.   readily moves all 4's.  sensation is intact to touch on the feet SKIN:  Normal texture and temperature.  No rash or suspicious lesion is visible.   NODES:  None palpable at the neck PSYCH: alert, well-oriented.  Does not appear anxious nor depressed.   i personally reviewed electrocardiogram tracing (today): Indication: wellness Impression: normal    Assessment & Plan:  Wellness visit today, with  problems stable, except as noted. Patient is advised the following: Patient Instructions  Please consider these measures for your health:  minimize alcohol.  Do not use tobacco products.  Have a colonoscopy at least every 10 years from age 29.  Women should have an annual mammogram from age 69.  Keep firearms safely stored.  Always use seat belts.  have working smoke alarms in your home.  See an eye doctor and dentist regularly.  Never drive under the influence of alcohol or drugs (including prescription drugs).   blood tests are requested for you today.  We'll let you know about the results.  Please come back in 1 year.

## 2016-06-29 LAB — CBC WITH DIFFERENTIAL/PLATELET
Basophils Absolute: 0 10*3/uL (ref 0.0–0.1)
Basophils Relative: 0.4 % (ref 0.0–3.0)
EOS PCT: 2.2 % (ref 0.0–5.0)
Eosinophils Absolute: 0.2 10*3/uL (ref 0.0–0.7)
HCT: 36.5 % (ref 36.0–46.0)
Hemoglobin: 12.1 g/dL (ref 12.0–15.0)
LYMPHS ABS: 2.3 10*3/uL (ref 0.7–4.0)
Lymphocytes Relative: 30 % (ref 12.0–46.0)
MCHC: 33.1 g/dL (ref 30.0–36.0)
MCV: 78.6 fl (ref 78.0–100.0)
MONO ABS: 0.5 10*3/uL (ref 0.1–1.0)
MONOS PCT: 6.5 % (ref 3.0–12.0)
NEUTROS ABS: 4.8 10*3/uL (ref 1.4–7.7)
NEUTROS PCT: 60.9 % (ref 43.0–77.0)
PLATELETS: 357 10*3/uL (ref 150.0–400.0)
RBC: 4.64 Mil/uL (ref 3.87–5.11)
RDW: 14.2 % (ref 11.5–15.5)
WBC: 7.8 10*3/uL (ref 4.0–10.5)

## 2016-06-29 LAB — BASIC METABOLIC PANEL
BUN: 10 mg/dL (ref 6–23)
CALCIUM: 10.3 mg/dL (ref 8.4–10.5)
CO2: 30 meq/L (ref 19–32)
CREATININE: 0.83 mg/dL (ref 0.40–1.20)
Chloride: 100 mEq/L (ref 96–112)
GFR: 94.3 mL/min (ref >60.00)
GLUCOSE: 100 mg/dL — AB (ref 70–99)
Potassium: 3.6 mEq/L (ref 3.5–5.1)
SODIUM: 138 meq/L (ref 135–145)

## 2016-06-29 LAB — HEPATIC FUNCTION PANEL
ALBUMIN: 4.6 g/dL (ref 3.5–5.2)
ALK PHOS: 49 U/L (ref 39–117)
ALT: 14 U/L (ref 0–35)
AST: 14 U/L (ref 0–37)
BILIRUBIN DIRECT: 0.1 mg/dL (ref 0.0–0.3)
TOTAL PROTEIN: 7.6 g/dL (ref 6.0–8.3)
Total Bilirubin: 0.3 mg/dL (ref 0.2–1.2)

## 2016-06-29 LAB — LIPID PANEL
CHOL/HDL RATIO: 4
Cholesterol: 206 mg/dL — ABNORMAL HIGH (ref 0–200)
HDL: 51 mg/dL (ref >39.00)
LDL CALC: 131 mg/dL — AB (ref 0–99)
NONHDL: 155.05
Triglycerides: 121 mg/dL (ref 0.0–149.0)
VLDL: 24.2 mg/dL (ref 0.0–40.0)

## 2016-06-29 LAB — IBC PANEL
Iron: 28 ug/dL — ABNORMAL LOW (ref 42–145)
Saturation Ratios: 6.9 % — ABNORMAL LOW (ref 20.0–50.0)
TRANSFERRIN: 288 mg/dL (ref 212.0–360.0)

## 2016-06-29 LAB — URINALYSIS, ROUTINE W REFLEX MICROSCOPIC
Bilirubin Urine: NEGATIVE
Hgb urine dipstick: NEGATIVE
Ketones, ur: NEGATIVE
Leukocytes, UA: NEGATIVE
Nitrite: NEGATIVE
PH: 5.5 (ref 5.0–8.0)
RBC / HPF: NONE SEEN (ref 0–?)
SPECIFIC GRAVITY, URINE: 1.01 (ref 1.000–1.030)
TOTAL PROTEIN, URINE-UPE24: NEGATIVE
Urine Glucose: NEGATIVE
Urobilinogen, UA: 0.2 (ref 0.0–1.0)
WBC, UA: NONE SEEN (ref 0–?)

## 2016-06-29 LAB — MICROALBUMIN / CREATININE URINE RATIO
Creatinine,U: 60.1 mg/dL
Microalb Creat Ratio: 1.2 mg/g (ref 0.0–30.0)
Microalb, Ur: <0.7 mg/dL (ref 0.0–1.9)

## 2016-06-29 LAB — HEMOGLOBIN A1C: Hgb A1c MFr Bld: 6.4 % (ref 4.6–6.5)

## 2016-06-29 LAB — HIV ANTIBODY (ROUTINE TESTING W REFLEX): HIV 1&2 Ab, 4th Generation: NONREACTIVE

## 2016-06-29 LAB — TSH: TSH: 0.71 u[IU]/mL (ref 0.35–4.50)

## 2016-07-01 ENCOUNTER — Ambulatory Visit (INDEPENDENT_AMBULATORY_CARE_PROVIDER_SITE_OTHER): Payer: 59 | Admitting: Certified Nurse Midwife

## 2016-07-01 ENCOUNTER — Encounter: Payer: Self-pay | Admitting: Certified Nurse Midwife

## 2016-07-01 VITALS — BP 118/80 | HR 70 | Resp 16 | Ht 66.0 in | Wt 177.0 lb

## 2016-07-01 DIAGNOSIS — Z86018 Personal history of other benign neoplasm: Secondary | ICD-10-CM | POA: Diagnosis not present

## 2016-07-01 DIAGNOSIS — N852 Hypertrophy of uterus: Secondary | ICD-10-CM | POA: Diagnosis not present

## 2016-07-01 DIAGNOSIS — Z124 Encounter for screening for malignant neoplasm of cervix: Secondary | ICD-10-CM | POA: Diagnosis not present

## 2016-07-01 DIAGNOSIS — Z0000 Encounter for general adult medical examination without abnormal findings: Secondary | ICD-10-CM | POA: Diagnosis not present

## 2016-07-01 DIAGNOSIS — Z01419 Encounter for gynecological examination (general) (routine) without abnormal findings: Secondary | ICD-10-CM | POA: Diagnosis not present

## 2016-07-01 NOTE — Patient Instructions (Signed)

## 2016-07-01 NOTE — Progress Notes (Addendum)
[id number] Divorced  African American Fe here for annual exam. Periods normal,no issues. Contraception withdrawal and interested in IUD contraception." does not want a baby". Sees Dr. Loanne Drilling for aex and labs. All normal except elevated cholesterol. Glucose control good and hypertension stable with management with MD. Would like GC,chlamydia screen, no concerns. Does not feel fibroids have changed. No other health issues today.  Patient's last menstrual period was 06/18/2016 (exact date).          Sexually active: Yes.    The current method of family planning is withdrawal.    Exercising: Yes.    walking Smoker:  no  Health Maintenance: Pap:  12-28-13 neg MMG:  Bilateral mammo & rt breast u/s 10/16, a few cyst located category c density birads 2:neg 3D Colonoscopy:  9/14 f/u 63yrs BMD:   none TDaP:  2016 Shingles: no Pneumonia: no Hep C and HIV: HIV neg 2017 Labs: pcp Self breast exam: done monthly   reports that she has never smoked. She has never used smokeless tobacco. She reports that she does not drink alcohol or use drugs.  Past Medical History:  Diagnosis Date  . Abnormal Pap smear of cervix 10-02-07   ASCUS  . ANEMIA, IRON DEFICIENCY   . DIABETES MELLITUS, TYPE II   . Fibroid   . Headache(784.0)   . HYPERCHOLESTEROLEMIA   . HYPERTENSION   . Migraines   . Unspecified disorder of thyroid   . URINARY INCONTINENCE     Past Surgical History:  Procedure Laterality Date  . APPENDECTOMY    . CESAREAN SECTION  1998  . TONSILLECTOMY AND ADENOIDECTOMY      Current Outpatient Prescriptions  Medication Sig Dispense Refill  . glucose blood (ONETOUCH VERIO) test strip Use to check blood sugar 1 time per day. 100 each 2  . hydrochlorothiazide (HYDRODIURIL) 25 MG tablet Take 1 tablet (25 mg total) by mouth daily. for high blood pressure 30 tablet 11  . IRON PO Take by mouth daily.    . metFORMIN (GLUCOPHAGE-XR) 500 MG 24 hr tablet TAKE (2) TABLETS TWICE DAILY. 120 tablet 11  .  oxybutynin (DITROPAN) 5 MG tablet Take 1 tablet (5 mg total) by mouth 2 (two) times daily. 60 tablet 11  . SUMAtriptan (IMITREX) 100 MG tablet TAKE 1 TABLET AT ONSET-MAY REPEAT IN 2 HOURS AS NEEDED- NO MORE THAN 2 IN 24 HOURS. 30 tablet 2   No current facility-administered medications for this visit.     Family History  Problem Relation Age of Onset  . Diabetes Mother   . Thyroid disease Mother   . Hypertension Mother   . Diabetes Father   . Colon cancer Father 7    2002  . Hypertension Father   . Stroke Father   . Heart disease Father     stints  . Breast cancer Maternal Aunt   . Breast cancer Maternal Aunt   . Breast cancer Maternal Aunt   . Breast cancer Maternal Aunt   . Breast cancer Maternal Aunt     ROS:  Pertinent items are noted in HPI.  Otherwise, a comprehensive ROS was negative.  Exam:   BP 118/80   Pulse 70   Resp 16   Ht 5\' 6"  (1.676 m)   Wt 177 lb (80.3 kg)   LMP 06/18/2016 (Exact Date)   BMI 28.57 kg/m  Height: 5\' 6"  (167.6 cm) Ht Readings from Last 3 Encounters:  07/01/16 5\' 6"  (1.676 m)  06/28/16 5' 6.5" (1.689 m)  05/07/15 5' 6.5" (1.689 m)    General appearance: alert, cooperative and appears stated age Head: Normocephalic, without obvious abnormality, atraumatic Neck: no adenopathy, supple, symmetrical, trachea midline and thyroid normal to inspection and palpation Lungs: clear to auscultation bilaterally Breasts: normal appearance, no masses or tenderness, No nipple retraction or dimpling, No nipple discharge or bleeding, No axillary or supraclavicular adenopathy Heart: regular rate and rhythm Abdomen: soft, non-tender; no masses,  no organomegaly Extremities: extremities normal, atraumatic, no cyanosis or edema Skin: Skin color, texture, turgor normal. No rashes or lesions Lymph nodes: Cervical, supraclavicular, and axillary nodes normal. No abnormal inguinal nodes palpated Neurologic: Grossly normal   Pelvic: External genitalia:  no  lesions              Urethra:  normal appearing urethra with no masses, tenderness or lesions              Bartholin's and Skene's: normal                 Vagina: normal appearing vagina with normal color and discharge, no lesions,               Cervix: multiparous appearance, no bleeding following Pap and no cervical motion tenderness              Pap taken: Yes.   Bimanual Exam:  Uterus:  enlarged, 10-12 week size nodular feel tilts to left weeks size  Adnexa:           normal adnexa, no mass, fullness, tenderness and unable to palpate on left               Rectovaginal: Confirms               Anus:  normal sphincter tone, no lesions  Chaperone present: yes  A:  Well Woman with normal exam  Contraception withdrawal  Mirena IUD desired  Enlarged uterus history of fibroids, ? Size change  Diabetic good control with MD management  Screening labs desired  P:   Reviewed health and wellness pertinent to exam  Discussed risks and benefits of Mirena IUD, insertion/removal and bleeding expectations. Patient aware she will need to have inserted on period. And will need insurance precert. Information given on Mirena and insurance precert. Discussed would need evaluate Fibroids prior to scheduling insertion.  Discussed ? Size change of fibroids and need for PUS evaluation .Agreeable. Patient will be called with infor and scheduled.  Continue follow up with MD as indicated.  Labs:Gc,Chlamydia, wet prep  Pap smear as above with HPVHR   counseled on breast self exam, mammography screening, family planning choices, adequate intake of calcium and vitamin D, diet and exercise  return annually or prn  An After Visit Summary was printed and given to the patient.

## 2016-07-02 LAB — WET PREP BY MOLECULAR PROBE
CANDIDA SPECIES: NEGATIVE
GARDNERELLA VAGINALIS: NEGATIVE
TRICHOMONAS VAG: NEGATIVE

## 2016-07-05 NOTE — Progress Notes (Signed)
Encounter reviewed. Agree with plan for ultrasound (order was placed at her visit) [name] Boast, MD

## 2016-07-06 LAB — IPS PAP TEST WITH HPV

## 2016-07-06 LAB — IPS N GONORRHOEA AND CHLAMYDIA BY PCR

## 2016-07-13 ENCOUNTER — Telehealth: Payer: Self-pay | Admitting: Certified Nurse Midwife

## 2016-07-13 NOTE — Telephone Encounter (Signed)
Spoke with patient in regards to benefits for recommended ultrasound. Patient understood information presented. Patient is requesting to defer scheduling appointment until January 2018. Advised patient I would forward questions to Melvia Heaps to advise. Patient is agreeable to a return call.  Routing to [employer]

## 2016-07-13 NOTE — Telephone Encounter (Signed)
OK to wait until 1/18, but needs to follow up with evaluation

## 2016-07-14 NOTE — Telephone Encounter (Signed)
Left message to call Michelle Castillo at [phone].  

## 2016-07-20 NOTE — Telephone Encounter (Signed)
yes

## 2016-07-20 NOTE — Telephone Encounter (Signed)
Left detailed message -ok per current dpr. Advised as seen below per Melvia Heaps, CNM. Advised to return call to office for scheduling.  Melvia Heaps, CNM -ok to close encounter?

## 2016-08-05 ENCOUNTER — Other Ambulatory Visit: Payer: Self-pay | Admitting: Certified Nurse Midwife

## 2016-08-05 DIAGNOSIS — Z1231 Encounter for screening mammogram for malignant neoplasm of breast: Secondary | ICD-10-CM

## 2016-08-30 DIAGNOSIS — D509 Iron deficiency anemia, unspecified: Secondary | ICD-10-CM

## 2016-08-30 HISTORY — DX: Iron deficiency anemia, unspecified: D50.9

## 2016-09-07 ENCOUNTER — Ambulatory Visit
Admission: RE | Admit: 2016-09-07 | Discharge: 2016-09-07 | Disposition: A | Discharge status: Home / Self Care | Payer: 59 | Source: Ambulatory Visit | Attending: Certified Nurse Midwife | Admitting: Certified Nurse Midwife

## 2016-09-07 DIAGNOSIS — Z1231 Encounter for screening mammogram for malignant neoplasm of breast: Secondary | ICD-10-CM | POA: Diagnosis not present

## 2016-09-08 ENCOUNTER — Telehealth: Payer: Self-pay | Admitting: Certified Nurse Midwife

## 2016-09-08 NOTE — Telephone Encounter (Signed)
Called patient to review benefits for a recommended procedure. Left Voicemail requesting a call back. °

## 2016-09-21 NOTE — Telephone Encounter (Signed)
Called patient to review benefits for a recommended ultrasound. Left Voicemail requesting a call back. °

## 2016-09-24 NOTE — Telephone Encounter (Signed)
Called patient to review benefits for a recommended ultrasound. Left Voicemail requesting a call back. °

## 2016-10-01 NOTE — Telephone Encounter (Signed)
Annual with French Ana CNM November 2017.  ? Change in size of fibroids noted on exam. Pelvic ultrasound ordered for evaluation. Patient declined to schedule PUS till January. Referral coordinator has made three attempts to contact patient to schedule, no patient response.   At annual, reported normal cycles and normal hgb in Oct 2017 with PCP.  Reviewed with Dr Sabra Heck, will close order until patient calls for follow-up.  Routing to provider for final review.  Will close encounter.   CC: Debbi Leonard,CNM Lerry Liner, referral coordinator

## 2016-12-15 ENCOUNTER — Telehealth: Payer: Self-pay | Admitting: Certified Nurse Midwife

## 2016-12-15 NOTE — Telephone Encounter (Signed)
Spoke with patient. Patient states that 3 weeks ago she began to have dark brown discharge then started having intermittent heavy bleeding. States she alternated between the two for a few days then bleeding stopped. On 12/11/2016 her bleeding started again. Reports she is wearing a tampon and a thick pad that she is changing every 2 hours due to filling both. Denies any cramping, pain, fatigue, SOB, weakness, or dizziness. Is taking daily iron. Is passing quarter sized clots which she states is not normal for her. Advised she will need to be seen for further evaluation. Patient is agreeable. Appointment scheduled for tomorrow 12/16/2016 at 2 pm with Michelle Castillo CNM. Patient is agreeable to date and time. Advised if bleeding increases to having to change pad/tampon every hour for more than 2 hours due to bleeding through she will need to be seen for immediate evaluation with our office or the ER. Patient verbalizes understanding.  Routing to provider for final review. Patient agreeable to disposition. Will close encounter.

## 2016-12-15 NOTE — Telephone Encounter (Signed)
Patient called with concerns about heavy bleeding. She said, "For the last three days I've been having very heavy bleeding. That's not normal for me and I'm starting to get concerned."   Last seen: 07/01/16 for AEX

## 2016-12-16 ENCOUNTER — Ambulatory Visit (INDEPENDENT_AMBULATORY_CARE_PROVIDER_SITE_OTHER): Payer: 59 | Admitting: Certified Nurse Midwife

## 2016-12-16 ENCOUNTER — Encounter: Payer: Self-pay | Admitting: Certified Nurse Midwife

## 2016-12-16 ENCOUNTER — Other Ambulatory Visit: Payer: Self-pay | Admitting: Certified Nurse Midwife

## 2016-12-16 ENCOUNTER — Ambulatory Visit (INDEPENDENT_AMBULATORY_CARE_PROVIDER_SITE_OTHER): Payer: 59 | Admitting: Obstetrics and Gynecology

## 2016-12-16 VITALS — BP 110/76 | HR 68 | Resp 16 | Ht 66.0 in | Wt 162.0 lb

## 2016-12-16 DIAGNOSIS — D259 Leiomyoma of uterus, unspecified: Secondary | ICD-10-CM

## 2016-12-16 DIAGNOSIS — N920 Excessive and frequent menstruation with regular cycle: Secondary | ICD-10-CM

## 2016-12-16 DIAGNOSIS — D508 Other iron deficiency anemias: Secondary | ICD-10-CM | POA: Diagnosis not present

## 2016-12-16 DIAGNOSIS — Z862 Personal history of diseases of the blood and blood-forming organs and certain disorders involving the immune mechanism: Secondary | ICD-10-CM | POA: Diagnosis not present

## 2016-12-16 DIAGNOSIS — N939 Abnormal uterine and vaginal bleeding, unspecified: Secondary | ICD-10-CM | POA: Diagnosis not present

## 2016-12-16 DIAGNOSIS — E039 Hypothyroidism, unspecified: Secondary | ICD-10-CM | POA: Diagnosis not present

## 2016-12-16 LAB — IBC PANEL
%SAT: 52 % — AB (ref 11–50)
TIBC: 341 ug/dL (ref 250–450)
UIBC: 164 ug/dL (ref 125–400)

## 2016-12-16 LAB — POCT URINE PREGNANCY: Preg Test, Ur: NEGATIVE

## 2016-12-16 LAB — CBC WITH DIFFERENTIAL/PLATELET
BASOS ABS: 100 {cells}/uL (ref 0–200)
Basophils Relative: 2 %
EOS ABS: 50 {cells}/uL (ref 15–500)
Eosinophils Relative: 1 %
HEMATOCRIT: 26.7 % — AB (ref 35.0–45.0)
HEMOGLOBIN: 8.7 g/dL — AB (ref 11.7–15.5)
Lymphocytes Relative: 32 %
Lymphs Abs: 1600 cells/uL (ref 850–3900)
MCH: 26.6 pg — AB (ref 27.0–33.0)
MCHC: 32.6 g/dL (ref 32.0–36.0)
MCV: 81.7 fL (ref 80.0–100.0)
MONO ABS: 400 {cells}/uL (ref 200–950)
MONOS PCT: 8 %
MPV: 9.8 fL (ref 7.5–12.5)
NEUTROS ABS: 2850 {cells}/uL (ref 1500–7800)
Neutrophils Relative %: 57 %
PLATELETS: 350 10*3/uL (ref 140–400)
RBC: 3.27 MIL/uL — ABNORMAL LOW (ref 3.80–5.10)
RDW: 14 % (ref 11.0–15.0)
WBC: 5 10*3/uL (ref 3.8–10.8)

## 2016-12-16 LAB — HEMOGLOBIN, FINGERSTICK: HEMOGLOBIN, FINGERSTICK: 8 g/dL — AB (ref 12.0–15.0)

## 2016-12-16 LAB — TSH: TSH: 0.25 mIU/L — ABNORMAL LOW

## 2016-12-16 LAB — IRON: Iron: 177 ug/dL (ref 40–190)

## 2016-12-16 MED ORDER — NORETHINDRONE ACETATE 5 MG PO TABS: ORAL_TABLET | ORAL | 0 refills | Status: DC

## 2016-12-16 NOTE — Progress Notes (Addendum)
GYNECOLOGY  VISIT   HPI: 51 y.o.   Married  [place] American  female   [phone] with Patient's last menstrual period was 11/25/2016.   here for heavy bleeding. The patient has a known fibroid uterus, typically has monthly cycles for 5 days, one day is heavy, saturating a tampon in 4 hours. Started bleeding 3 weeks ago, on time. Initially light until the last 5-6 days, since then it's been very heavy, saturating a tampon and a pad in 2 hours.  Using W/D for contraception, no pain with intercourse.  GYNECOLOGIC HISTORY: Patient's last menstrual period was 11/25/2016. Contraception:none  Menopausal hormone therapy: none        OB History    Gravida Para Term Preterm AB Living   2 1     1 1    SAB TAB Ectopic Multiple Live Births     1     1         Patient Active Problem List   Diagnosis Date Noted  . Pain in joint, lower leg 03/15/2013  . Encounter for long-term (current) use of other medications 06/23/2012  . Routine general medical examination at a health care facility 06/23/2012  . Screening examination for infectious disease 06/23/2012  . HYPERCHOLESTEROLEMIA 07/30/2010  . Iron deficiency anemia 07/30/2010  . HEADACHE 03/19/2008  . UNSPECIFIED DISORDER OF THYROID 07/10/2007  . MYALGIA 07/10/2007  . Diabetes ([place]) 04/06/2007  . HYPERTENSION 04/06/2007  . URINARY INCONTINENCE 04/06/2007    Past Medical History:  Diagnosis Date  . Abnormal Pap smear of cervix 10-02-07   ASCUS  . ANEMIA, IRON DEFICIENCY   . DIABETES MELLITUS, TYPE II   . Fibroid   . Headache(784.0)   . HYPERCHOLESTEROLEMIA   . HYPERTENSION   . Migraines   . Unspecified disorder of thyroid   . URINARY INCONTINENCE     Past Surgical History:  Procedure Laterality Date  . APPENDECTOMY    . CESAREAN SECTION  1998  . TONSILLECTOMY AND ADENOIDECTOMY      Current Outpatient Prescriptions  Medication Sig Dispense Refill  . glucose blood (ONETOUCH VERIO) test strip Use to check blood sugar 1 time per  day. 100 each 2  . hydrochlorothiazide (HYDRODIURIL) 25 MG tablet Take 1 tablet (25 mg total) by mouth daily. for high blood pressure 30 tablet 11  . IRON PO Take by mouth daily.    . metFORMIN (GLUCOPHAGE-XR) 500 MG 24 hr tablet TAKE (2) TABLETS TWICE DAILY. 120 tablet 11  . norethindrone (AYGESTIN) 5 MG tablet Take 1 tablet up to TID until bleeding is minimal, then take 1 tablet a day for 30 days 60 tablet 0  . oxybutynin (DITROPAN) 5 MG tablet Take 1 tablet (5 mg total) by mouth 2 (two) times daily. (Patient taking differently: Take 5 mg by mouth as needed. ) 60 tablet 11  . SUMAtriptan (IMITREX) 100 MG tablet TAKE 1 TABLET AT ONSET-MAY REPEAT IN 2 HOURS AS NEEDED- NO MORE THAN 2 IN 24 HOURS. 30 tablet 2   No current facility-administered medications for this visit.      ALLERGIES: Patient has no known allergies.  Family History  Problem Relation Age of Onset  . Diabetes Father   . Colon cancer Father 21    2002  . Hypertension Father   . Stroke Father   . Heart disease Father     stints  . Diabetes Mother   . Thyroid disease Mother   . Hypertension Mother   . Breast cancer Maternal  Aunt   . Breast cancer Maternal Aunt   . Breast cancer Maternal Aunt   . Breast cancer Maternal Aunt   . Breast cancer Maternal Aunt     Social History   Social History  . Marital status: Married    Spouse name: N/A  . Number of children: 1  . Years of education: N/A   Occupational History  . [employer]   Social History Main Topics  . Smoking status: Never Smoker  . Smokeless tobacco: Never Used  . Alcohol use No  . Drug use: No  . Sexual activity: Yes    Partners: Male    Birth control/ protection: None     Comment: withdrawal   Other Topics Concern  . Not on file   Social History Narrative   Occupation: Works Orthoptist   Regular exercise-yes          Review of Systems  Constitutional: Negative.   HENT: Negative.   Eyes: Negative.    Respiratory: Negative.   Cardiovascular: Negative.   Gastrointestinal: Negative.   Genitourinary:       Heavy menstrual bleeding   Musculoskeletal: Negative.   Skin: Negative.   Neurological: Negative.   Endo/Heme/Allergies: Negative.   Psychiatric/Behavioral: Negative.     PHYSICAL EXAMINATION:    LMP 11/25/2016 Comment: bleeding since then    General appearance: alert, cooperative and appears stated age Abdomen: soft, mildly tender mass in her lower abdomen, more prominent on the left, c/w a fibroid uterus Pelvic: External genitalia:  no lesions              Urethra:  normal appearing urethra with no masses, tenderness or lesions              Bartholins and Skenes: normal                 Vagina: normal appearing vagina with normal color and discharge, no lesions              Cervix: no lesions              Bimanual Exam:  Uterus:  irregularly shaped, tender uterus, approximately 16 week sized, up to her umbilicus on the left. Not filling her pelvis.              Adnexa: no mass, fullness, tenderness   Office Endometrial curettage: The risks of endometrial curettage were reviewed and a consent was obtained.  A speculum was placed in the vagina and the cervix was cleansed with betadine. A  tenaculum was placed on the cervix and the uterine evacuator was placed into the endometrial cavity. The uterus sounded to 16 cm. The endometrial curettage was performed, a large amount of tissue/blood was obtained (filled the syringe x 2). The tenaculum and speculum were removed. There were no complications.                Chaperone was present for exam.  Office Hgb 8.0 UPT negative  ASSESSMENT Abnormal uterine bleeding for the last 3 weeks, prior to that she was having normal monthly cycles Fibroid uterus, enlarging. Not typically symptomatic    PLAN Office D&C was performed, bleeding markedly lightened Start on Aygestin 5 mg, 1 tablet 1-3 x a day. She was advised to take the Aygestin  up to 3 x a day until her bleeding became minimal, then can decrease to 1 tablet a day as tolerated. I would like her to stay on the  Aygestin for approximately 1 month F/U in 2 weeks A CBC and TSH have already been drawn   An After Visit Summary was printed and given to the patient.  15 minutes face to face time of which over 50% was spent in counseling.    CC: Melvia Heaps, CNM  Addendum: Patient Position (if appropriate)   Orthostatic Vitals byReinaCMorales,CMA at04/19/181239   Orthostatic Lying   BP- Lying   138/78   Pulse- Lying   78 byReinaCMorales,CMA at04/19/181239   Orthostatic Sitting  BP- Sitting   140/84   Pulse- Sitting   84   Orthostatic Standing at 0 minutes  BP- Standing at 0 minutes   136/86   Pulse- Standing at 0 minutes   90 byReinaCMorales,CMA at04/19/181239   Orthostatic Standing at 3 minutes  Pulse- Standing at 3 minutes    88      Addendum: stat CBC with hgb of 8.7

## 2016-12-16 NOTE — Progress Notes (Signed)
Subjective:     Patient ID: Michelle Castillo, female   DOB: [birth year], 51 y.o.   MRN: [id number]  HPI 51 yo african [place] female g 2 p0011 divorced female here complaining of vaginal bleeding off and on for the past 3 weeks. Began getting heavy 6 days ago with no cramping using pads and tampons, changing now every 2 hours. Passing clots also and beginning to feel fatigued. Has been drinking fluids too. Drove from [place] and had blood going down her legs and saturated her clothes and car seat. Today is not as heavy and has used three pads and tampons since  7 am. Eating well. Decided she needed to be seen. Periods have been normal until this. "Never like this"! Contraception none. History of fibroids.  Sees Dr. Loanne Drilling for Hypothyroid.   Review of Systems  Gastrointestinal: Negative for abdominal pain.  Genitourinary: Negative for pelvic pain and vaginal bleeding.  Skin: Negative for color change and pallor.  Psychiatric/Behavioral: Negative.        Objective:   Physical Exam  Constitutional: She is oriented to person, place, and time. She appears well-developed and well-nourished.  Abdominal: Soft.  Mass noted, enlarged uterus  Genitourinary: There is no rash, tenderness or lesion on the right labia. There is no rash, tenderness or lesion on the left labia. Uterus is enlarged. Cervix exhibits no motion tenderness and no friability. Right adnexum displays no mass and no tenderness. Left adnexum displays no mass and no tenderness. There is bleeding in the vagina. No tenderness in the vagina.  Genitourinary Comments: Uterus 14-16 week size, known fibroids. Non tender.    Lymphadenopathy:       Right: No inguinal adenopathy present.       Left: No inguinal adenopathy present.  Neurological: She is alert and oriented to person, place, and time.  Skin: Skin is warm and dry.  Psychiatric: She has a normal mood and affect. Her behavior is normal. Judgment and thought content normal.        Assessment:   Normal pelvic exam Menorrhagia with DUB History of fibroids History of iron deficiency anemia Hypothyroid Fatigue    Plan:     Discussed with findings with patient of normal pelvic exam and bleeding related to fibroids. Discussed need to control bleeding uterine evacuation in office by Dr.Jertson after consulting with her regarding status. Patient agreeable. Also discussed medication to help with to control bleeding.  Questions answered. Labs: CBC with diff. Stat,TSH, Iron, IBC

## 2016-12-17 ENCOUNTER — Telehealth: Payer: Self-pay

## 2016-12-17 LAB — THYROID PROFILE - CHCC
FREE THYROXINE INDEX: 2.6 (ref 1.4–3.8)
T3 Uptake: 31 % (ref 22–35)
T4, Total: 8.3 ug/dL (ref 4.5–12.0)

## 2016-12-17 NOTE — Progress Notes (Signed)
Erroneous encounter

## 2016-12-17 NOTE — Telephone Encounter (Signed)
Spoke with patient. Patient states that she started taking Aygestin 5 mg. Patient is feeling "much better." Reports her bleeding has drastically decreased and is much lighter. "I feel a lot better today." Patient will continue taking her Aygestin and will contact the office if bleeding increases or develops any new symptoms.  Routing to provider for final review. Patient agreeable to disposition. Will close encounter.

## 2016-12-17 NOTE — Telephone Encounter (Signed)
-----   Message from Salvadore Dom, MD sent at 12/16/2016  4:28 PM EDT ----- Please call patient in the am to check on her. She had an office D&C on Thursday for very heavy bleeding. Her stat CBC had a hgb of 8.7. She was started on Aygestin, please check that it is helping. If any issues please call me.

## 2016-12-18 NOTE — Progress Notes (Signed)
Please see my separate encounter for this day

## 2016-12-20 ENCOUNTER — Telehealth: Payer: Self-pay | Admitting: *Deleted

## 2016-12-20 NOTE — Telephone Encounter (Signed)
-----   Message from Regina Eck, CNM sent at 12/20/2016  6:55 AM EDT ----- Notify patient that her Thyroid panel is normal, but her TSH is very low(see previous result). Please call and make her Endocrinologist Dr. Loanne Drilling aware of this and make appointment for patient. She was in last week with menorrhagia. Will need records sent.

## 2016-12-20 NOTE — Telephone Encounter (Signed)
Left message to call Seona Clemenson at [phone].  

## 2016-12-20 NOTE — Telephone Encounter (Signed)
Spoke with patient, advised of results and recommendations as seen below per Melvia Heaps, CNM. Patient verbalizes understanding and is agreeable.   Call to [facility] Endocrinology -Dr. Loanne Drilling. Spoke with [name], patient scheduled for 12/22/16 at 9:15am. Advised labs dated 12/16/16 to include TSH available for review in EPIC.   Call to patient, advised of appointment date and time with Dr. Loanne Drilling, patient is agreeable.   Routing to provider for final review. Patient is agreeable to disposition. Will close encounter.

## 2016-12-22 ENCOUNTER — Ambulatory Visit (INDEPENDENT_AMBULATORY_CARE_PROVIDER_SITE_OTHER): Payer: 59 | Admitting: Endocrinology

## 2016-12-22 VITALS — BP 126/82 | HR 95 | Ht 66.0 in | Wt 164.0 lb

## 2016-12-22 DIAGNOSIS — E0590 Thyrotoxicosis, unspecified without thyrotoxic crisis or storm: Secondary | ICD-10-CM

## 2016-12-22 NOTE — Progress Notes (Signed)
Subjective:    Patient ID: Michelle Castillo, female    DOB: 1966/06/21, 51 y.o.   MRN: [id number]  HPI Pt was recently found to have slightly suppressed TSH.  This has been intermittently noted since 2008.  she has never been on therapy for this.  she has never had XRT to the anterior neck, or thyroid surgery.  she has never had thyroid imaging.  she does not consume kelp or any other prescribed or non-prescribed thyroid medication.  she has never been on amiodarone.  She has lost weight (11 lbs x 6 mos) throughout the body, but no assoc tremor.   Past Medical History:  Diagnosis Date  . Abnormal Pap smear of cervix 10-02-07   ASCUS  . ANEMIA, IRON DEFICIENCY   . DIABETES MELLITUS, TYPE II   . Fibroid   . Headache(784.0)   . HYPERCHOLESTEROLEMIA   . HYPERTENSION   . Migraines   . Unspecified disorder of thyroid   . URINARY INCONTINENCE     Past Surgical History:  Procedure Laterality Date  . APPENDECTOMY    . CESAREAN SECTION  1998  . TONSILLECTOMY AND ADENOIDECTOMY      Social History   Social History  . Marital status: Married    Spouse name: N/A  . Number of children: 1  . Years of education: N/A   Occupational History  . [employer]   Social History Main Topics  . Smoking status: Never Smoker  . Smokeless tobacco: Never Used  . Alcohol use No  . Drug use: No  . Sexual activity: Yes    Partners: Male    Birth control/ protection: None     Comment: withdrawal   Other Topics Concern  . Not on file   Social History Narrative   Occupation: Works Orthoptist   Regular exercise-yes          Current Outpatient Prescriptions on File Prior to Visit  Medication Sig Dispense Refill  . glucose blood (ONETOUCH VERIO) test strip Use to check blood sugar 1 time per day. 100 each 2  . hydrochlorothiazide (HYDRODIURIL) 25 MG tablet Take 1 tablet (25 mg total) by mouth daily. for high blood pressure 30 tablet 11  . IRON PO Take by mouth  daily.    . metFORMIN (GLUCOPHAGE-XR) 500 MG 24 hr tablet TAKE (2) TABLETS TWICE DAILY. 120 tablet 11  . norethindrone (AYGESTIN) 5 MG tablet Take 1 tablet up to TID until bleeding is minimal, then take 1 tablet a day for 30 days 60 tablet 0  . oxybutynin (DITROPAN) 5 MG tablet Take 1 tablet (5 mg total) by mouth 2 (two) times daily. (Patient taking differently: Take 5 mg by mouth as needed. ) 60 tablet 11  . SUMAtriptan (IMITREX) 100 MG tablet TAKE 1 TABLET AT ONSET-MAY REPEAT IN 2 HOURS AS NEEDED- NO MORE THAN 2 IN 24 HOURS. 30 tablet 2   No current facility-administered medications on file prior to visit.     No Known Allergies  Family History  Problem Relation Age of Onset  . Diabetes Father   . Colon cancer Father 20    2002  . Hypertension Father   . Stroke Father   . Heart disease Father     stints  . Diabetes Mother   . Thyroid disease Mother   . Hypertension Mother   . Breast cancer Maternal Aunt   . Breast cancer Maternal Aunt   . Breast cancer Maternal  Aunt   . Breast cancer Maternal Aunt   . Breast cancer Maternal Aunt     BP 126/82   Pulse 95   Ht 5\' 6"  (1.676 m)   Wt 164 lb (74.4 kg)   LMP 11/25/2016 Comment: bleeding since then  SpO2 99%   BMI 26.47 kg/m    Review of Systems denies palpitations and sob.     Objective:   Physical Exam VITAL SIGNS:  See vs page.  GENERAL: no distress.  NECK: There is no palpable thyroid enlargement.  No thyroid nodule is palpable.  No palpable lymphadenopathy at the anterior neck.  Neuro: no tremor.   Lab Results  Component Value Date   TSH 0.25 (L) 12/16/2016   T4TOTAL 8.3 12/16/2016      Assessment & Plan:  Suppressed TSH, recurrent, usually due to small multinodular goiter.   Weight loss, new, due to her efforts.  However, in view of overall lack  of sxs, we'll hold off on rx for now.    Patient Instructions  No treatment or further thyroid testing is needed now. Please come back to the lab in 3 months.    Please come back for a regular physical appointment in 6 months (must be after 06/28/17)

## 2016-12-22 NOTE — Patient Instructions (Addendum)
No treatment or further thyroid testing is needed now. Please come back to the lab in 3 months.  Please come back for a regular physical appointment in 6 months (must be after 06/28/17)

## 2017-01-03 ENCOUNTER — Ambulatory Visit: Payer: 59 | Admitting: Obstetrics and Gynecology

## 2017-01-03 ENCOUNTER — Telehealth: Payer: Self-pay | Admitting: Obstetrics and Gynecology

## 2017-01-03 ENCOUNTER — Encounter: Payer: Self-pay | Admitting: Obstetrics and Gynecology

## 2017-01-03 NOTE — Telephone Encounter (Signed)
I called the patient to reschedule her 2 week recheck appointment for today that she missed. I left her a message to call back.

## 2017-01-05 ENCOUNTER — Other Ambulatory Visit: Payer: Self-pay | Admitting: Obstetrics and Gynecology

## 2017-01-05 NOTE — Telephone Encounter (Signed)
Her refill for aygestin was sent. Please call and check on her, see if she has set up  A f/u appointment yet.

## 2017-01-05 NOTE — Telephone Encounter (Signed)
Medication refill request: Aygestin   Last AEX:  07-01-16 Next AEX: not scheduled  Last MMG (if hormonal medication request): 09-07-16 WN L Refill authorized: please advise

## 2017-01-12 ENCOUNTER — Ambulatory Visit (INDEPENDENT_AMBULATORY_CARE_PROVIDER_SITE_OTHER): Payer: 59 | Admitting: Obstetrics and Gynecology

## 2017-01-12 ENCOUNTER — Telehealth: Payer: Self-pay | Admitting: Obstetrics and Gynecology

## 2017-01-12 ENCOUNTER — Encounter: Payer: Self-pay | Admitting: Obstetrics and Gynecology

## 2017-01-12 VITALS — BP 152/84 | HR 88 | Resp 16 | Wt 163.0 lb

## 2017-01-12 DIAGNOSIS — Z3009 Encounter for other general counseling and advice on contraception: Secondary | ICD-10-CM | POA: Diagnosis not present

## 2017-01-12 DIAGNOSIS — N939 Abnormal uterine and vaginal bleeding, unspecified: Secondary | ICD-10-CM

## 2017-01-12 DIAGNOSIS — Z862 Personal history of diseases of the blood and blood-forming organs and certain disorders involving the immune mechanism: Secondary | ICD-10-CM | POA: Diagnosis not present

## 2017-01-12 DIAGNOSIS — N949 Unspecified condition associated with female genital organs and menstrual cycle: Secondary | ICD-10-CM

## 2017-01-12 DIAGNOSIS — D259 Leiomyoma of uterus, unspecified: Secondary | ICD-10-CM | POA: Diagnosis not present

## 2017-01-12 LAB — CBC WITH DIFFERENTIAL/PLATELET
BASOS PCT: 2 %
Basophils Absolute: 114 cells/uL (ref 0–200)
Eosinophils Absolute: 114 cells/uL (ref 15–500)
Eosinophils Relative: 2 %
HEMATOCRIT: 31 % — AB (ref 35.0–45.0)
HEMOGLOBIN: 9.7 g/dL — AB (ref 11.7–15.5)
LYMPHS ABS: 1368 {cells}/uL (ref 850–3900)
Lymphocytes Relative: 24 %
MCH: 26.1 pg — ABNORMAL LOW (ref 27.0–33.0)
MCHC: 31.3 g/dL — ABNORMAL LOW (ref 32.0–36.0)
MCV: 83.6 fL (ref 80.0–100.0)
MONO ABS: 456 {cells}/uL (ref 200–950)
MPV: 9.4 fL (ref 7.5–12.5)
Monocytes Relative: 8 %
NEUTROS ABS: 3648 {cells}/uL (ref 1500–7800)
Neutrophils Relative %: 64 %
Platelets: 409 10*3/uL — ABNORMAL HIGH (ref 140–400)
RBC: 3.71 MIL/uL — AB (ref 3.80–5.10)
RDW: 15.5 % — ABNORMAL HIGH (ref 11.0–15.0)
WBC: 5.7 10*3/uL (ref 3.8–10.8)

## 2017-01-12 MED ORDER — MEDROXYPROGESTERONE ACETATE 10 MG PO TABS: ORAL_TABLET | ORAL | 1 refills | Status: DC

## 2017-01-12 MED ORDER — DOXYCYCLINE HYCLATE 100 MG PO CAPS: 100.0000 mg | ORAL_CAPSULE | Freq: Two times a day (BID) | ORAL | 0 refills | Status: DC

## 2017-01-12 MED ORDER — NORETHINDRONE 0.35 MG PO TABS: 1.0000 | ORAL_TABLET | Freq: Every day | ORAL | 0 refills | Status: DC

## 2017-01-12 NOTE — Telephone Encounter (Signed)
Please check on her scheduling for ultrasound, she needs to get in soon. I will call in provera for her, it should be cheaper than the aygestin. Lets have her do that instead of the micronor while we are awaiting her ultrasound. She can also take ibuprofen to decrease her flow.

## 2017-01-12 NOTE — Telephone Encounter (Signed)
Spoke with patient. Patient seen by Dr. Talbert Nan in office today, reports bleeidng is heavier since leaving, calling with update. Reports a gush of blood when returning home, followed by 2 clots -fifty cent piece sized. Bleeding slowed since, changing pad q2h. Denies dizziness, weakness or fatigue. Advised patient would update Dr. Talbert Nan and return call with recommendations. Patient is agreeable.   Dr. Talbert Nan, please advise?

## 2017-01-12 NOTE — Telephone Encounter (Signed)
Patient was seen earlier this morning with Dr. Talbert Nan and states that she has started bleeding heavier since going home and would like to speak with a nurse about it.

## 2017-01-12 NOTE — Progress Notes (Signed)
GYNECOLOGY  VISIT   HPI: 51 y.o.   Married  [place] American  female   [phone] with Patient's last menstrual period was 12/29/2016.   here for follow up abnormal uterine bleeding. The patient has a known fibroid uterus, typically having normal cycles monthly. In 4/18 she was seen with 3 weeks of continuous heavy bleeding.  Her hgb was 8.7 (prior CBC, not anemic). She underwent an office D&C and was started on Aygestin. The bleeding stopped with the Aygestin. She was on 1 tablet a day and started her "normal cycle" on 12/29/16, she stopped the Aygestin. She thought the bleeding was stopping, but then it started getting heavy again. Saturating a pad in up to 3 hours for 2 days. She then started the Aygestin again and the bleeding has slowed down to spotting.     She has been using w/d for contraception.  She had a low TSH in April of 0.25 mIU/L, other TFT's were normal. She is taking iron BID. Not tired any more, she does c/o mild discomfort in her lower abdomen, uterus is tender to the touch. This started in the last few weeks.  Pathology from the office curettage returned with proliferative endometrium.    GYNECOLOGIC HISTORY: Patient's last menstrual period was 12/29/2016. Contraception:none  Menopausal hormone therapy: none         OB History    Gravida Para Term Preterm AB Living   2 1     1 1    SAB TAB Ectopic Multiple Live Births     1     1         Patient Active Problem List   Diagnosis Date Noted  . Pain in joint, lower leg 03/15/2013  . Encounter for long-term (current) use of other medications 06/23/2012  . Routine general medical examination at a health care facility 06/23/2012  . Screening examination for infectious disease 06/23/2012  . HYPERCHOLESTEROLEMIA 07/30/2010  . Iron deficiency anemia 07/30/2010  . HEADACHE 03/19/2008  . Hyperthyroidism 07/10/2007  . MYALGIA 07/10/2007  . Diabetes ([place]) 04/06/2007  . HYPERTENSION 04/06/2007  . URINARY INCONTINENCE 04/06/2007     Past Medical History:  Diagnosis Date  . Abnormal Pap smear of cervix 10-02-07   ASCUS  . ANEMIA, IRON DEFICIENCY   . DIABETES MELLITUS, TYPE II   . Fibroid   . Headache(784.0)   . HYPERCHOLESTEROLEMIA   . HYPERTENSION   . Migraines   . Unspecified disorder of thyroid   . URINARY INCONTINENCE     Past Surgical History:  Procedure Laterality Date  . APPENDECTOMY    . CESAREAN SECTION  1998  . TONSILLECTOMY AND ADENOIDECTOMY      Current Outpatient Prescriptions  Medication Sig Dispense Refill  . glucose blood (ONETOUCH VERIO) test strip Use to check blood sugar 1 time per day. 100 each 2  . hydrochlorothiazide (HYDRODIURIL) 25 MG tablet Take 1 tablet (25 mg total) by mouth daily. for high blood pressure 30 tablet 11  . IRON PO Take by mouth daily.    . metFORMIN (GLUCOPHAGE-XR) 500 MG 24 hr tablet TAKE (2) TABLETS TWICE DAILY. 120 tablet 11  . norethindrone (AYGESTIN) 5 MG tablet TAKE 1 TAB UP TO 3 TIMES DAILY UNTIL BLEEDING IS MINIMAL, THEN TAKE 1TAB DAILY FOR 30 DAYS. 30 tablet 0  . oxybutynin (DITROPAN) 5 MG tablet Take 1 tablet (5 mg total) by mouth 2 (two) times daily. (Patient taking differently: Take 5 mg by mouth as needed. ) 60 tablet 11  .  SUMAtriptan (IMITREX) 100 MG tablet TAKE 1 TABLET AT ONSET-MAY REPEAT IN 2 HOURS AS NEEDED- NO MORE THAN 2 IN 24 HOURS. 30 tablet 2   No current facility-administered medications for this visit.      ALLERGIES: Patient has no known allergies.  Family History  Problem Relation Age of Onset  . Diabetes Father   . Colon cancer Father 64       2002  . Hypertension Father   . Stroke Father   . Heart disease Father        stints  . Diabetes Mother   . Thyroid disease Mother   . Hypertension Mother   . Breast cancer Maternal Aunt   . Breast cancer Maternal Aunt   . Breast cancer Maternal Aunt   . Breast cancer Maternal Aunt   . Breast cancer Maternal Aunt     Social History   Social History  . Marital status: Married     Spouse name: N/A  . Number of children: 1  . Years of education: N/A   Occupational History  . [employer]   Social History Main Topics  . Smoking status: Never Smoker  . Smokeless tobacco: Never Used  . Alcohol use No  . Drug use: No  . Sexual activity: Yes    Partners: Male    Birth control/ protection: None     Comment: withdrawal   Other Topics Concern  . Not on file   Social History Narrative   Occupation: Works Orthoptist   Regular exercise-yes          Review of Systems  Constitutional: Negative.   HENT: Negative.   Eyes: Negative.   Respiratory: Negative.   Cardiovascular: Negative.   Gastrointestinal: Negative.   Genitourinary:       Menstrual cramping Menorrhagia    Musculoskeletal: Negative.   Skin: Negative.   Neurological: Negative.   Endo/Heme/Allergies: Negative.   Psychiatric/Behavioral: Negative.     PHYSICAL EXAMINATION:    BP (!) 152/84 (BP Location: Right Arm, Patient Position: Sitting, Cuff Size: Normal)   Pulse 88   Resp 16   Wt 163 lb (73.9 kg)   LMP 12/29/2016   BMI 26.31 kg/m     General appearance: alert, cooperative and appears stated age Neck: no adenopathy, supple, symmetrical, trachea midline and thyroid normal to inspection and palpation Abdomen: soft, 16 week sized uterus palpated in her lower abdomen, up to just under the umbilicus, uterus is very tender to palpation, particularly on the left side, mobile  no organomegaly  Pelvic: External genitalia:  no lesions              Urethra:  normal appearing urethra with no masses, tenderness or lesions              Bartholins and Skenes: 2.5 cm right bartholin's cyst, not tender              Cervix: no cervical motion tenderness              Bimanual Exam:  Uterus:  16 week sized, tender, mobile, not filling the pelvis, not extending out laterally to the side walls.               Adnexa: no mass, fullness, tenderness                Chaperone was present for exam.  ASSESSMENT Fibroid uterus Abnormal uterine bleeding just in the last 2 months, prior  to that she was having normal cycles. Negative endometrial curettage, low TSH, other TFT's normal, Endocrinologist is just going to watch her, no medication.  H/O anemia last month Uterine tenderness, this is new, ? Possible endometritis vs fibroid degeneration, needs evaluation    PLAN Will treat with doxycycline for 10 days CBC with diff and ferritin Return for an ultrasound, possible sonohysterogram We discussed potential options of continuing on daily aygestin, trying the mini-pill, depo-provera or off label use of a mirena IUD to try and help her bleeding (she also needs contraception). The aygestin was expensive The patient would like to try the micronor, will start it today If medical treatment fails other options are uterine artery embolization and laparoscopic hysterectomy   An After Visit Summary was printed and given to the patient.  CC: Evalee Mutton, CNM

## 2017-01-12 NOTE — Telephone Encounter (Signed)
Please call and check on the patient in the am.  

## 2017-01-12 NOTE — Telephone Encounter (Addendum)
Left detailed message, ok per current dpr. Advised new Rx for provera to pharmacy on file, reviewed instructions. Advised not to start micronor. Can take ibuprofen to help decrease flow.  Advised patient office phones are off for afternoon, return back on at [place] patient would f/u on 5/17 for scheduling of [place].    Suzy, ok to schedule SHGM?   Cc: Lerry Liner

## 2017-01-13 LAB — FERRITIN: FERRITIN: 13 ng/mL (ref 10–232)

## 2017-01-13 NOTE — Telephone Encounter (Signed)
Spoke with patient regarding benefit for abdominal ultrasound with possible sonohysterogram (see acct notes for details).  Patient understood and agreeable. Patient ready to schedule. Patient scheduled 01/18/17 with Dr Talbert Nan. Patient aware of  Date, arrival time and cancellation policy.  Call transferred to [facility] for follow up from appointment yesterday, 01/12/17.  Routing to Dr Talbert Nan  cc: Glorianne Manchester

## 2017-01-13 NOTE — Telephone Encounter (Signed)
Call transferred from [place]. Spoke with patient. Patient states she started provera last night, took 2 pills as directed. Flow is medium, no clots. Patient reports a constant discomfort on left side of pelvis, not cramping, 4/10, taking aleve, at work today.  Patient scheduled for PUS/SHGM on 01/18/17. Advised patient would update Dr. Talbert Nan and return call with any additional recommendation.   Routing to provider for final review. Patient is agreeable to disposition. Will close encounter.

## 2017-01-13 NOTE — Telephone Encounter (Signed)
Left message to call Karron Alvizo at [phone].  

## 2017-01-14 ENCOUNTER — Telehealth: Payer: Self-pay | Admitting: *Deleted

## 2017-01-14 NOTE — Telephone Encounter (Signed)
-----   Message from Salvadore Dom, MD sent at 01/13/2017  5:03 PM EDT ----- The patient is taking iron BID and still has a low ferritin of 13. Her hgb from yesterday was 9.7. I understand from the phone note earlier today that her current flow is moderate.  Please set her up to see hematology for possible iron transfusion.

## 2017-01-14 NOTE — Telephone Encounter (Signed)
Spoke with patient, advised of results and recommendations as seen below per Dr. Talbert Nan. Patient states she had an iron transfusion a few years ago and it was very expensive, still paying on it. Patient states unless Dr. Talbert Nan feels this is absolutely necessary, would like to wait until PUS on 01/18/17 and discuss at that time. Patient reports bleeding is not as heavy, changing pad q3-4 hours with little clotting. Referral declined at this time. Advised patient would update Dr. Talbert Nan and return call with any additional recommendations, patient verbalizes understanding and is agreeable.  Routing to provider for final review. Patient is agreeable to disposition. Will close encounter.

## 2017-01-18 ENCOUNTER — Ambulatory Visit (INDEPENDENT_AMBULATORY_CARE_PROVIDER_SITE_OTHER): Payer: 59

## 2017-01-18 ENCOUNTER — Ambulatory Visit (INDEPENDENT_AMBULATORY_CARE_PROVIDER_SITE_OTHER): Payer: 59 | Admitting: Obstetrics and Gynecology

## 2017-01-18 ENCOUNTER — Other Ambulatory Visit: Payer: Self-pay | Admitting: Obstetrics and Gynecology

## 2017-01-18 ENCOUNTER — Encounter: Payer: Self-pay | Admitting: Obstetrics and Gynecology

## 2017-01-18 VITALS — BP 150/82 | HR 88 | Resp 16 | Wt 160.0 lb

## 2017-01-18 DIAGNOSIS — N939 Abnormal uterine and vaginal bleeding, unspecified: Secondary | ICD-10-CM

## 2017-01-18 DIAGNOSIS — D500 Iron deficiency anemia secondary to blood loss (chronic): Secondary | ICD-10-CM | POA: Diagnosis not present

## 2017-01-18 DIAGNOSIS — D259 Leiomyoma of uterus, unspecified: Secondary | ICD-10-CM

## 2017-01-18 MED ORDER — NORETHINDRONE ACETATE 5 MG PO TABS: ORAL_TABLET | ORAL | 1 refills | Status: DC

## 2017-01-18 NOTE — Progress Notes (Addendum)
GYNECOLOGY  VISIT   HPI: 51 y.o.   Married  [place] American  female   [phone] with Patient's last menstrual period was 12/29/2016.   here for follow up AUB and fibroids. The patient has been having abnormal uterine bleeding in the last few months (prior to that normal cycles). In 4/18, hgb of 8.7, office D&C with proliferative endometrium. TSH low, other TFT's normal.    She was on Aygestin after the D&C, it stopped her bleeding for a few days. She then got her "cycle", stopped the aygestin. Her bleeding persisted, then started on micronor, bleeding got heavier so she went on provera, still taking that 2 x a day. She continues to bleed, medium flow. Changing her pad every few hours, gets lighter and picks back up. She continues to have pain on the left side of her uterus for the last 2 weeks. Tender to palpation.  Last hgb on 5/16 was 9.7 gm/dl, ferritin was 13.  Negative pap with negative HPV on 11/17.  GYNECOLOGIC HISTORY: Patient's last menstrual period was 12/29/2016. Contraception:none  Menopausal hormone therapy: none         OB History    Gravida Para Term Preterm AB Living   2 1     1 1    SAB TAB Ectopic Multiple Live Births     1     1         Patient Active Problem List   Diagnosis Date Noted  . Pain in joint, lower leg 03/15/2013  . Encounter for long-term (current) use of other medications 06/23/2012  . Routine general medical examination at a health care facility 06/23/2012  . Screening examination for infectious disease 06/23/2012  . HYPERCHOLESTEROLEMIA 07/30/2010  . Iron deficiency anemia 07/30/2010  . HEADACHE 03/19/2008  . Hyperthyroidism 07/10/2007  . MYALGIA 07/10/2007  . Diabetes ([place]) 04/06/2007  . HYPERTENSION 04/06/2007  . URINARY INCONTINENCE 04/06/2007    Past Medical History:  Diagnosis Date  . Abnormal Pap smear of cervix 10-02-07   ASCUS  . ANEMIA, IRON DEFICIENCY   . DIABETES MELLITUS, TYPE II   . Fibroid   . Headache(784.0)   .  HYPERCHOLESTEROLEMIA   . HYPERTENSION   . Migraines   . Unspecified disorder of thyroid   . URINARY INCONTINENCE     Past Surgical History:  Procedure Laterality Date  . APPENDECTOMY    . CESAREAN SECTION  1998  . TONSILLECTOMY AND ADENOIDECTOMY      Current Outpatient Prescriptions  Medication Sig Dispense Refill  . doxycycline (VIBRAMYCIN) 100 MG capsule Take 1 capsule (100 mg total) by mouth 2 (two) times daily. Take BID for 10 days.  Take with food as can cause GI distress. 20 capsule 0  . glucose blood (ONETOUCH VERIO) test strip Use to check blood sugar 1 time per day. 100 each 2  . hydrochlorothiazide (HYDRODIURIL) 25 MG tablet Take 1 tablet (25 mg total) by mouth daily. for high blood pressure 30 tablet 11  . IRON PO Take by mouth daily.    . medroxyPROGESTERone (PROVERA) 10 MG tablet Take 2 tablets now, then 1 tablet po BID until bleeding stops, then 1 tablet a day. 30 tablet 1  . metFORMIN (GLUCOPHAGE-XR) 500 MG 24 hr tablet TAKE (2) TABLETS TWICE DAILY. 120 tablet 11  . norethindrone (AYGESTIN) 5 MG tablet TAKE 1 TAB UP TO 3 TIMES DAILY UNTIL BLEEDING IS MINIMAL, THEN TAKE 1TAB DAILY FOR 30 DAYS. 30 tablet 0  . norethindrone (  MICRONOR,CAMILA,ERRIN) 0.35 MG tablet Take 1 tablet (0.35 mg total) by mouth daily. 3 Package 0  . oxybutynin (DITROPAN) 5 MG tablet Take 1 tablet (5 mg total) by mouth 2 (two) times daily. (Patient taking differently: Take 5 mg by mouth as needed. ) 60 tablet 11  . SUMAtriptan (IMITREX) 100 MG tablet TAKE 1 TABLET AT ONSET-MAY REPEAT IN 2 HOURS AS NEEDED- NO MORE THAN 2 IN 24 HOURS. 30 tablet 2   No current facility-administered medications for this visit.      ALLERGIES: Patient has no known allergies.  Family History  Problem Relation Age of Onset  . Diabetes Father   . Colon cancer Father 32       2002  . Hypertension Father   . Stroke Father   . Heart disease Father        stints  . Diabetes Mother   . Thyroid disease Mother   .  Hypertension Mother   . Breast cancer Maternal Aunt   . Breast cancer Maternal Aunt   . Breast cancer Maternal Aunt   . Breast cancer Maternal Aunt   . Breast cancer Maternal Aunt     Social History   Social History  . Marital status: Married    Spouse name: N/A  . Number of children: 1  . Years of education: N/A   Occupational History  . [employer]   Social History Main Topics  . Smoking status: Never Smoker  . Smokeless tobacco: Never Used  . Alcohol use No  . Drug use: No  . Sexual activity: Yes    Partners: Male    Birth control/ protection: None     Comment: withdrawal   Other Topics Concern  . Not on file   Social History Narrative   Occupation: Works Orthoptist   Regular exercise-yes          Review of Systems  Constitutional: Negative.   HENT: Negative.   Eyes: Negative.   Respiratory: Negative.   Cardiovascular: Negative.   Gastrointestinal: Negative.   Genitourinary:       AUB   Musculoskeletal: Negative.   Skin: Negative.   Neurological: Negative.   Endo/Heme/Allergies: Negative.   Psychiatric/Behavioral: Negative.     PHYSICAL EXAMINATION:    BP (!) 150/82 (BP Location: Right Arm, Patient Position: Sitting, Cuff Size: Normal)   Pulse 88   Resp 16   Wt 160 lb (72.6 kg)   LMP 12/29/2016   BMI 25.82 kg/m     General appearance: alert, cooperative and appears stated age  Reviewed ultrasound images with the patient  ASSESSMENT Symptomatic fibroid uterus AUB Anemia Right ovarian cyst, 3.8 cm, suspect hemorrhagic CL H/O Type II diabetes and HTN    PLAN We discussed the options of re trying micronor, depo-provera, trying a mirena IUD (would need contraception) and TLH. She desires TLH Will schedule TLH/BS/cystoscopy Return for a pre-op Will stop the provera and restart Aygestin 5mg , 1-2 tablets a day to control bleeding Continue BID iron Discussed time off from work, she may want to work some from  home after a few weeks Will need to check a HgbA1C secondary to her h/o diabetes. Will have her f/u with her primary for surgical clearance   An After Visit Summary was printed and given to the patient.  25 minutes face to face time of which over 50% was spent in counseling.    CC: Renato Shin, MD  Addendum 01/22/17: HgbA1C is 5.3

## 2017-01-20 ENCOUNTER — Telehealth: Payer: Self-pay | Admitting: Obstetrics and Gynecology

## 2017-01-20 ENCOUNTER — Telehealth: Payer: Self-pay

## 2017-01-20 DIAGNOSIS — R790 Abnormal level of blood mineral: Secondary | ICD-10-CM

## 2017-01-20 NOTE — Telephone Encounter (Signed)
Error

## 2017-01-20 NOTE — Telephone Encounter (Signed)
Spoke with patient in regards to benefits for recommended surgery. During this conversation, patient requested to have nurse call her. Patient states she is to have an iron infusion, but has questions regarding this procedure. Advised patient her question will be forwarded to triage nurse for review. Patient is agreeable to a return call.  Routing to Triage Nurse

## 2017-01-20 NOTE — Telephone Encounter (Signed)
Please advise. [facility] called trying to get clearance for surgery.  They need ot know if it'd be okay or if she'd need to come back in.  Please advise on how to proceed. Thank you!    Call back number [phone]  [phone] Gay Filler

## 2017-01-20 NOTE — Telephone Encounter (Signed)
Ov would be needed.

## 2017-01-20 NOTE — Telephone Encounter (Signed)
Call to Dr Cordelia Pen office, Lanny Hurst, requesting instruction for pre-op clearance.   Message sent to Dr Loanne Drilling for review and we will receive a call back.

## 2017-01-20 NOTE — Telephone Encounter (Signed)
Call to patient to review recommendations from Dr Talbert Nan for HgbA1C and PCP clearance for surgery scheduled for 02-22-17.  Will review request for iron infusoin as well. Patient previously declined referral to hematology.   Left message to call back.

## 2017-01-20 NOTE — Telephone Encounter (Signed)
Return call from patient. States she has decided on hematology appointment Denies any change in how she is feeling. States she has reviewed financial benefits with business office and has decided she wants to proceed.  Advised will schedule appointment and notify her.  States last saw PCP for thyroid check less than one month ago. No recent HgbA1C. Appoitnment for HgbA1C scheduled for tomorrow am. Will call Dr Loanne Drilling regarding PCP clearance and notify her is appointment needed.

## 2017-01-20 NOTE — Telephone Encounter (Signed)
Call from [place] with Dr Marin Olp. Referral received and under review with physician. Once approved, Venora Maples will call patient to schedule appointment.

## 2017-01-20 NOTE — Telephone Encounter (Signed)
Patient returning your call.

## 2017-01-21 ENCOUNTER — Other Ambulatory Visit (INDEPENDENT_AMBULATORY_CARE_PROVIDER_SITE_OTHER): Payer: 59

## 2017-01-21 ENCOUNTER — Other Ambulatory Visit: Payer: Self-pay | Admitting: *Deleted

## 2017-01-21 DIAGNOSIS — E119 Type 2 diabetes mellitus without complications: Secondary | ICD-10-CM

## 2017-01-21 NOTE — Telephone Encounter (Signed)
Patient notified of MD's response and voiced understanding. She had no further questions at this time and scheduled her appointment for 01/25/2017.

## 2017-01-21 NOTE — Telephone Encounter (Signed)
Per review of notes in EPIC, Dr Cordelia Pen office notified patient she would need office visit for surgical clearance. She has scheduled appointment for 01-25-17.  Becky/referral coordinator for our office contacted Michelle Castillo at Dr Antonieta Pert office regarding appointment. They have referral and will contact patient directly to schedule.  Routing to provider for final review. Patient agreeable to disposition. Will close encounter.

## 2017-01-22 LAB — HEMOGLOBIN A1C
HEMOGLOBIN A1C: 5.3 % (ref <5.7)
MEAN PLASMA GLUCOSE: 105 mg/dL

## 2017-01-25 ENCOUNTER — Encounter: Payer: Self-pay | Admitting: Certified Nurse Midwife

## 2017-01-25 ENCOUNTER — Ambulatory Visit (INDEPENDENT_AMBULATORY_CARE_PROVIDER_SITE_OTHER): Payer: 59 | Admitting: Certified Nurse Midwife

## 2017-01-25 ENCOUNTER — Ambulatory Visit (INDEPENDENT_AMBULATORY_CARE_PROVIDER_SITE_OTHER): Payer: 59 | Admitting: Endocrinology

## 2017-01-25 ENCOUNTER — Encounter: Payer: Self-pay | Admitting: Endocrinology

## 2017-01-25 VITALS — BP 132/86 | HR 89 | Ht 66.0 in | Wt 161.0 lb

## 2017-01-25 VITALS — BP 120/80 | HR 70 | Resp 16 | Ht 66.0 in | Wt 162.0 lb

## 2017-01-25 DIAGNOSIS — N898 Other specified noninflammatory disorders of vagina: Secondary | ICD-10-CM | POA: Diagnosis not present

## 2017-01-25 DIAGNOSIS — Z113 Encounter for screening for infections with a predominantly sexual mode of transmission: Secondary | ICD-10-CM | POA: Diagnosis not present

## 2017-01-25 DIAGNOSIS — Z01818 Encounter for other preprocedural examination: Secondary | ICD-10-CM | POA: Diagnosis not present

## 2017-01-25 NOTE — Progress Notes (Signed)
Subjective:    Patient ID: Michelle Castillo, female    DOB: 06/05/1966, 51 y.o.   MRN: [id number]  HPI Pt reports a few mos of menorrhagia.  TAH (but no BSO) is planned.  pt states she otherwise feels well in general Past Medical History:  Diagnosis Date  . Abnormal Pap smear of cervix 10-02-07   ASCUS  . ANEMIA, IRON DEFICIENCY   . DIABETES MELLITUS, TYPE II   . Fibroid   . Headache(784.0)   . HYPERCHOLESTEROLEMIA   . HYPERTENSION   . Migraines   . Unspecified disorder of thyroid   . URINARY INCONTINENCE     Past Surgical History:  Procedure Laterality Date  . APPENDECTOMY    . CESAREAN SECTION  1998  . TONSILLECTOMY AND ADENOIDECTOMY      Social History   Social History  . Marital status: Married    Spouse name: N/A  . Number of children: 1  . Years of education: N/A   Occupational History  . [employer]   Social History Main Topics  . Smoking status: Never Smoker  . Smokeless tobacco: Never Used  . Alcohol use No  . Drug use: No  . Sexual activity: Yes    Partners: Male     Comment: withdrawal   Other Topics Concern  . Not on file   Social History Narrative   Occupation: Works Orthoptist   Regular exercise-yes          Current Outpatient Prescriptions on File Prior to Visit  Medication Sig Dispense Refill  . doxycycline (VIBRAMYCIN) 100 MG capsule Take 1 capsule (100 mg total) by mouth 2 (two) times daily. Take BID for 10 days.  Take with food as can cause GI distress. 20 capsule 0  . glucose blood (ONETOUCH VERIO) test strip Use to check blood sugar 1 time per day. 100 each 2  . hydrochlorothiazide (HYDRODIURIL) 25 MG tablet Take 1 tablet (25 mg total) by mouth daily. for high blood pressure 30 tablet 11  . IRON PO Take by mouth daily.    . metFORMIN (GLUCOPHAGE-XR) 500 MG 24 hr tablet TAKE (2) TABLETS TWICE DAILY. 120 tablet 11  . norethindrone (AYGESTIN) 5 MG tablet 1 tablet 1-2 x a day to control bleeding 30 tablet  1  . oxybutynin (DITROPAN) 5 MG tablet Take 1 tablet (5 mg total) by mouth 2 (two) times daily. (Patient taking differently: Take 5 mg by mouth as needed. ) 60 tablet 11  . SUMAtriptan (IMITREX) 100 MG tablet TAKE 1 TABLET AT ONSET-MAY REPEAT IN 2 HOURS AS NEEDED- NO MORE THAN 2 IN 24 HOURS. 30 tablet 2   No current facility-administered medications on file prior to visit.     No Known Allergies  Family History  Problem Relation Age of Onset  . Diabetes Father   . Colon cancer Father 66       2002  . Hypertension Father   . Stroke Father   . Heart disease Father        stints  . Diabetes Mother   . Thyroid disease Mother   . Hypertension Mother   . Breast cancer Maternal Aunt   . Breast cancer Maternal Aunt   . Breast cancer Maternal Aunt   . Breast cancer Maternal Aunt   . Breast cancer Maternal Aunt     BP 132/86   Pulse 89   Ht 5\' 6"  (1.676 m)   Wt 161 lb (73  kg)   LMP 12/29/2016   SpO2 99%   BMI 25.99 kg/m    Review of Systems She also has pelvic pain.  Denies chest pain and sob.    Objective:   Physical Exam VITAL SIGNS:  See vs page GENERAL: no distress LUNGS:  Clear to auscultation HEART:  Regular rate and rhythm without murmurs noted. Normal S1,S2.   Pulses: dorsalis pedis intact bilat.   MSK: no deformity of the feet CV: no leg edema Skin:  no ulcer on the feet.  normal color and temp on the feet. Neuro: sensation is intact to touch on the feet.    Lab Results  Component Value Date   HGBA1C 5.3 01/21/2017   I personally reviewed electrocardiogram tracing (today): Indication: preop Impression: NSR.  No MI.  No hypertrophy. Compared to 2017: no change  Lab Results  Component Value Date   WBC 5.7 01/12/2017   HGB 9.7 (L) 01/12/2017   HCT 31.0 (L) 01/12/2017   MCV 83.6 01/12/2017   PLT 409 (H) 01/12/2017      Assessment & Plan:  Type 2 DM: well-controlled.  HTN: well-controlled. Anemia, due to menorrhagia Please continue the same  medications Her surgical risk is low and outweighed by the potential benefit of the surgery.  She is therefore medically cleared.  Patient Instructions  You are good to go for the surgery, from my standpoint.   I'll see you next time.

## 2017-01-25 NOTE — Patient Instructions (Signed)
You are good to go for the surgery, from my standpoint.   I'll see you next time.

## 2017-01-25 NOTE — Progress Notes (Signed)
51 y.o. Married [place] American female [phone] here with complaint of vaginal symptoms feels irritated in labia minora area only. Denies increase in discharge or itching. No odor or bleeding change. Sexually active with new partner with condom use most of the time together that evening. Concerned about vaginal infection or STD. No urinary symptoms or new personal products. No other health issues today. Patient scheduled for hysterectomy in June for menorrhagia and fibroids.    ROS Pertinent to HPI  O:Healthy female WDWN Affect: normal, orientation x 3  Exam: Abdomen soft, non tender  Inguinal Lymph nodes: no enlargement or tenderness Pelvic exam: External genital: normal female, no lesions or scaling or exudate BUS: negative Vagina: scant white non odorous discharge noted.     Affirm taken Cervix: normal, non tender, no CMT Uterus: normal, non tender Adnexa:normal, non tender, no masses or fullness noted     A:Normal pelvic exam R/O vaginal infection and STD vaginal only   P:Discussed findings of normal pelvic exam. No evidence in exam of vaginal infection or STD. Will wait for screening results. Recommend consistent condom use for protection. Can have HIV, RPR later if she desires, declined today.  Discussed Aveeno   sitz bath for comfort. Questions addressed. Labs:Gc/Chlamydia, wet prep   Rv prn

## 2017-01-26 ENCOUNTER — Other Ambulatory Visit: Payer: Self-pay

## 2017-01-26 ENCOUNTER — Telehealth: Payer: Self-pay

## 2017-01-26 LAB — GC/CHLAMYDIA PROBE AMP
CT Probe RNA: NOT DETECTED
GC Probe RNA: NOT DETECTED

## 2017-01-26 LAB — WET PREP BY MOLECULAR PROBE
CANDIDA SPECIES: NOT DETECTED
Gardnerella vaginalis: DETECTED — AB
Trichomonas vaginosis: NOT DETECTED

## 2017-01-26 MED ORDER — METRONIDAZOLE 0.75 % VA GEL: VAGINAL | 0 refills | Status: DC

## 2017-01-26 NOTE — Telephone Encounter (Signed)
Patient returning your call.

## 2017-01-26 NOTE — Telephone Encounter (Signed)
Pt notified of results

## 2017-01-26 NOTE — Telephone Encounter (Signed)
Pt would like to reschedule appts.

## 2017-01-26 NOTE — Telephone Encounter (Signed)
lmtcb to give results

## 2017-01-26 NOTE — Telephone Encounter (Signed)
It is fine to reschedule appointments. She needs appointments approximately 2-4 weeks pre-op and 1 and 4 weeks post op.

## 2017-01-26 NOTE — Telephone Encounter (Signed)
While giving patient her lab results, pt states she needed to reschedule some of her appts made around her surgery dates. Pt aware she will get a callback to discuss.

## 2017-01-27 NOTE — Telephone Encounter (Signed)
Spoke with patient regarding appointment dates and times. Patient decided to keep dates and times as is -eh

## 2017-01-31 ENCOUNTER — Other Ambulatory Visit: Payer: Self-pay | Admitting: Family

## 2017-01-31 DIAGNOSIS — D649 Anemia, unspecified: Secondary | ICD-10-CM

## 2017-02-01 ENCOUNTER — Ambulatory Visit (HOSPITAL_BASED_OUTPATIENT_CLINIC_OR_DEPARTMENT_OTHER): Payer: 59 | Admitting: Family

## 2017-02-01 ENCOUNTER — Ambulatory Visit: Payer: 59

## 2017-02-01 ENCOUNTER — Other Ambulatory Visit (HOSPITAL_BASED_OUTPATIENT_CLINIC_OR_DEPARTMENT_OTHER): Payer: 59

## 2017-02-01 VITALS — BP 146/81 | HR 84 | Temp 98.3°F | Resp 16 | Wt 165.0 lb

## 2017-02-01 DIAGNOSIS — D500 Iron deficiency anemia secondary to blood loss (chronic): Secondary | ICD-10-CM | POA: Diagnosis not present

## 2017-02-01 DIAGNOSIS — N920 Excessive and frequent menstruation with regular cycle: Secondary | ICD-10-CM | POA: Diagnosis not present

## 2017-02-01 DIAGNOSIS — D649 Anemia, unspecified: Secondary | ICD-10-CM

## 2017-02-01 LAB — COMPREHENSIVE METABOLIC PANEL
ALT: 10 U/L (ref 0–55)
ANION GAP: 9 meq/L (ref 3–11)
AST: 14 U/L (ref 5–34)
Albumin: 4.3 g/dL (ref 3.5–5.0)
Alkaline Phosphatase: 49 U/L (ref 40–150)
BUN: 12.3 mg/dL (ref 7.0–26.0)
CHLORIDE: 103 meq/L (ref 98–109)
CO2: 24 meq/L (ref 22–29)
Calcium: 9.7 mg/dL (ref 8.4–10.4)
Creatinine: 1 mg/dL (ref 0.6–1.1)
EGFR: 76 mL/min/{1.73_m2} — AB (ref >90)
GLUCOSE: 161 mg/dL — AB (ref 70–140)
Potassium: 3.5 mEq/L (ref 3.5–5.1)
SODIUM: 136 meq/L (ref 136–145)
Total Bilirubin: 0.27 mg/dL (ref 0.20–1.20)
Total Protein: 7.4 g/dL (ref 6.4–8.3)

## 2017-02-01 LAB — CBC WITH DIFFERENTIAL (CANCER CENTER ONLY)
BASO#: 0.1 10*3/uL (ref 0.0–0.2)
BASO%: 1.2 % (ref 0.0–2.0)
EOS ABS: 0.2 10*3/uL (ref 0.0–0.5)
EOS%: 2.3 % (ref 0.0–7.0)
HCT: 35.4 % (ref 34.8–46.6)
HGB: 11.2 g/dL — ABNORMAL LOW (ref 11.6–15.9)
LYMPH#: 1.7 10*3/uL (ref 0.9–3.3)
LYMPH%: 24.2 % (ref 14.0–48.0)
MCH: 26.2 pg (ref 26.0–34.0)
MCHC: 31.6 g/dL — ABNORMAL LOW (ref 32.0–36.0)
MCV: 83 fL (ref 81–101)
MONO#: 0.4 10*3/uL (ref 0.1–0.9)
MONO%: 6.3 % (ref 0.0–13.0)
NEUT#: 4.5 10*3/uL (ref 1.5–6.5)
NEUT%: 66 % (ref 39.6–80.0)
PLATELETS: 428 10*3/uL — AB (ref 145–400)
RBC: 4.27 10*6/uL (ref 3.70–5.32)
RDW: 13.9 % (ref 11.1–15.7)
WBC: 6.9 10*3/uL (ref 3.9–10.0)

## 2017-02-01 LAB — IRON AND TIBC
%SAT: 38 % (ref 21–57)
Iron: 145 ug/dL — ABNORMAL HIGH (ref 41–142)
TIBC: 381 ug/dL (ref 236–444)
UIBC: 237 ug/dL (ref 120–384)

## 2017-02-01 LAB — RETICULOCYTES: RETICULOCYTE COUNT: 1.2 % (ref 0.6–2.6)

## 2017-02-01 LAB — FERRITIN: FERRITIN: 9 ng/mL (ref 9–269)

## 2017-02-01 LAB — CHCC SATELLITE - SMEAR

## 2017-02-01 NOTE — Progress Notes (Signed)
Hematology/Oncology Consultation   Name: RESHANDA LEWEY      MRN: [id number]    Location: Room/bed info not found  Date: 02/01/2017 Time:9:42 AM   REFERRING PHYSICIAN: Dorothy Spark, MD  REASON FOR CONSULT: Low ferritin    DIAGNOSIS:  Iron deficiency anemia secondary to chronic blood loss due to heavy cycle   HISTORY OF PRESENT ILLNESS: Ms. Bress is a very pleasant 51 yo African American female with history of iron deficiency anemia due to uterine fibroids and heavy cycles with cramping. She received 2 doses of IV iron in May of 2015. She is currently on an over the counter oral iron supplement and her Hgb is up to 11.2 from 9.7. Ferritin in May was 13. Platelet count is slightly elevated today at 428. Iron studies are pending.  She has had no other bleeding and no bruising or petechiae.  She is scheduled to have a hysterectomy on June 26th. She states that there is strong family history on her mother's side of the family of uterine fibroids and menorrhagia. Her mother and mother's 7 sisters all required hysterectomies in their late 20's - early 68's.  She is symptomatic with fatigue and chills at this time.  No personal or familial history or sickle cell trait or disease.  She has history of several surgeries including 1 c-section and appendectomy with no problem with bleeding.  She has 1 son and denies history of miscarriage.  No personal cancer history. She had 6 maternal aunts with history of breast cancer. She has had cysts of the breast in the past. She is up to date on her mammogram (January 2018) which was negative.  No fever, n/v, cough, rash, dizziness, SOB, chest pain, palpitations or changes in her bowel or bladder habits.  No lymphadenopathy found on exam.  No swelling, tenderness, numbness or tingling in her extremities.  She has maintained a good appetite and is staying well hydrated. Her weight is stable.  She is diabetic and blood sugars are well controlled with diet and  exercising regularly. She takes Metformin BID. Her Hgb A1c in May was 5.4.  She has never smoked and only has an alcoholic beverage socially on occasion.  She works in [employer] for [employer] and enjoys her job.   ROS: All other 10 point review of systems is negative.   PAST MEDICAL HISTORY:   Past Medical History:  Diagnosis Date  . Abnormal Pap smear of cervix 10-02-07   ASCUS  . ANEMIA, IRON DEFICIENCY   . DIABETES MELLITUS, TYPE II   . Fibroid   . Headache(784.0)   . HYPERCHOLESTEROLEMIA   . HYPERTENSION   . Migraines   . Unspecified disorder of thyroid   . URINARY INCONTINENCE     ALLERGIES: No Known Allergies    MEDICATIONS:  Current Outpatient Prescriptions on File Prior to Visit  Medication Sig Dispense Refill  . doxycycline (VIBRAMYCIN) 100 MG capsule Take 1 capsule (100 mg total) by mouth 2 (two) times daily. Take BID for 10 days.  Take with food as can cause GI distress. 20 capsule 0  . glucose blood (ONETOUCH VERIO) test strip Use to check blood sugar 1 time per day. 100 each 2  . hydrochlorothiazide (HYDRODIURIL) 25 MG tablet Take 1 tablet (25 mg total) by mouth daily. for high blood pressure 30 tablet 11  . IRON PO Take by mouth daily.    . metFORMIN (GLUCOPHAGE-XR) 500 MG 24 hr tablet TAKE (2) TABLETS TWICE  DAILY. 120 tablet 11  . metroNIDAZOLE (METROGEL) 0.75 % vaginal gel Use 1 applicatorful twice daily for 5 days 70 g 0  . norethindrone (AYGESTIN) 5 MG tablet 1 tablet 1-2 x a day to control bleeding 30 tablet 1  . oxybutynin (DITROPAN) 5 MG tablet Take 1 tablet (5 mg total) by mouth 2 (two) times daily. (Patient taking differently: Take 5 mg by mouth as needed. ) 60 tablet 11  . progesterone (PROMETRIUM) 100 MG capsule Take 100 mg by mouth daily.    . SUMAtriptan (IMITREX) 100 MG tablet TAKE 1 TABLET AT ONSET-MAY REPEAT IN 2 HOURS AS NEEDED- NO MORE THAN 2 IN 24 HOURS. 30 tablet 2   No current facility-administered medications on file prior to  visit.      PAST SURGICAL HISTORY Past Surgical History:  Procedure Laterality Date  . APPENDECTOMY    . CESAREAN SECTION  1998  . TONSILLECTOMY AND ADENOIDECTOMY      FAMILY HISTORY: Family History  Problem Relation Age of Onset  . Diabetes Father   . Colon cancer Father 69       2002  . Hypertension Father   . Stroke Father   . Heart disease Father        stints  . Diabetes Mother   . Thyroid disease Mother   . Hypertension Mother   . Breast cancer Maternal Aunt   . Breast cancer Maternal Aunt   . Breast cancer Maternal Aunt   . Breast cancer Maternal Aunt   . Breast cancer Maternal Aunt     SOCIAL HISTORY:  reports that she has never smoked. She has never used smokeless tobacco. She reports that she does not drink alcohol or use drugs.  PERFORMANCE STATUS: The patient's performance status is 1 - Symptomatic but completely ambulatory  PHYSICAL EXAM: Most Recent Vital Signs: There were no vitals taken for this visit. There were no vitals taken for this visit.  General Appearance:    Alert, cooperative, no distress, appears stated age  Head:    Normocephalic, without obvious abnormality, atraumatic  Eyes:    PERRL, conjunctiva/corneas clear, EOM's intact, fundi    benign, both eyes        Throat:   Lips, mucosa, and tongue normal; teeth and gums normal  Neck:   Supple, symmetrical, trachea midline, no adenopathy;    thyroid:  no enlargement/tenderness/nodules; no carotid   bruit or JVD  Back:     Symmetric, no curvature, ROM normal, no CVA tenderness  Lungs:     Clear to auscultation bilaterally, respirations unlabored  Chest Wall:    No tenderness or deformity   Heart:    Regular rate and rhythm, S1 and S2 normal, no murmur, rub   or gallop     Abdomen:     Soft, non-tender, bowel sounds active all four quadrants,    no masses, no organomegaly        Extremities:   Extremities normal, atraumatic, no cyanosis or edema  Pulses:   2+ and symmetric all  extremities  Skin:   Skin color, texture, turgor normal, no rashes or lesions  Lymph nodes:   Cervical, supraclavicular, and axillary nodes normal  Neurologic:   CNII-XII intact, normal strength, sensation and reflexes    throughout    LABORATORY DATA:  Results for orders placed or performed in visit on 02/01/17 (from the past 48 hour(s))  CBC w/Diff     Status: Abnormal   Collection Time: 02/01/17  9:13 AM  Result Value Ref Range   WBC 6.9 3.9 - 10.0 10e3/uL   RBC 4.27 3.70 - 5.32 10e6/uL   HGB 11.2 (L) 11.6 - 15.9 g/dL   HCT 35.4 34.8 - 46.6 %   MCV 83 81 - 101 fL   MCH 26.2 26.0 - 34.0 pg   MCHC 31.6 (L) 32.0 - 36.0 g/dL   RDW 13.9 11.1 - 15.7 %   Platelets 428 (H) 145 - 400 10e3/uL   NEUT# 4.5 1.5 - 6.5 10e3/uL   LYMPH# 1.7 0.9 - 3.3 10e3/uL   MONO# 0.4 0.1 - 0.9 10e3/uL   Eosinophils Absolute 0.2 0.0 - 0.5 10e3/uL   BASO# 0.1 0.0 - 0.2 10e3/uL   NEUT% 66.0 39.6 - 80.0 %   LYMPH% 24.2 14.0 - 48.0 %   MONO% 6.3 0.0 - 13.0 %   EOS% 2.3 0.0 - 7.0 %   BASO% 1.2 0.0 - 2.0 %  Smear     Status: None   Collection Time: 02/01/17  9:13 AM  Result Value Ref Range   Smear Result Smear Available       RADIOGRAPHY: No results found.     PATHOLOGY: None  ASSESSMENT/PLAN: Ms. Gaul is a very pleasant 51 yo [place] American female with history of iron deficiency anemia due to uterine fibroids and heavy cycles with cramping. She is symptomatic with fatigue and chills. Ferritin 3 weeks ago was 13. Iron studies for today are pending. She is on an oral iron supplement at this time and has had an improvement in her Hgb.  We will see what her iron studies show and bring her back in later this week for infusion if needed.  She is scheduled to have her hysterectomy on June 26th.  We will go ahead and plan to see her back in 3 months for repeat lab work and follow-up.   All questions were answered. She will contact our office with any questions or concerns. We can certainly see her much  sooner if necessary.  She was discussed with and also seen by Dr. Marin Olp and he is in agreement with the aforementioned.   [facility]     Addendum:  We saw and examined the patient with Judson Roch. I agree with her above assessment. The hysterectomy clearly will saw the problem for her.  Her ferritin is only 9. Her iron saturation actually is not too bad.  I think she is doing okay on oral iron. Her iron saturation is doing okay. As such, I'm not sure that we really need to give any IV iron in this situation.  I would like to see her back in 3 months. By then, she would've recovered from her hysterectomy.  We spent about 40-45 minutes with her. We answered all of her questions. We reviewed her lab work.  [name], MD

## 2017-02-02 LAB — ERYTHROPOIETIN: ERYTHROPOIETIN: 23.6 m[IU]/mL — AB (ref 2.6–18.5)

## 2017-02-08 ENCOUNTER — Encounter: Payer: Self-pay | Admitting: *Deleted

## 2017-02-08 ENCOUNTER — Encounter: Payer: Self-pay | Admitting: Obstetrics and Gynecology

## 2017-02-08 ENCOUNTER — Ambulatory Visit (INDEPENDENT_AMBULATORY_CARE_PROVIDER_SITE_OTHER): Payer: 59 | Admitting: Obstetrics and Gynecology

## 2017-02-08 VITALS — BP 150/82 | HR 80 | Resp 16 | Wt 163.0 lb

## 2017-02-08 DIAGNOSIS — N926 Irregular menstruation, unspecified: Secondary | ICD-10-CM | POA: Diagnosis not present

## 2017-02-08 DIAGNOSIS — I10 Essential (primary) hypertension: Secondary | ICD-10-CM

## 2017-02-08 DIAGNOSIS — D259 Leiomyoma of uterus, unspecified: Secondary | ICD-10-CM

## 2017-02-08 DIAGNOSIS — Z8639 Personal history of other endocrine, nutritional and metabolic disease: Secondary | ICD-10-CM

## 2017-02-08 DIAGNOSIS — N750 Cyst of Bartholin's gland: Secondary | ICD-10-CM

## 2017-02-08 DIAGNOSIS — D500 Iron deficiency anemia secondary to blood loss (chronic): Secondary | ICD-10-CM

## 2017-02-08 LAB — POCT URINE PREGNANCY: PREG TEST UR: NEGATIVE

## 2017-02-08 NOTE — Progress Notes (Signed)
GYNECOLOGY  VISIT   HPI: 51 y.o.   Married  [place] American  female   [phone] with Patient's last menstrual period was 12/29/2016.   here for a pre-operative visit. She has a symptomatic fibroid uterus with menorrhagia leading to anemia. Recent hgb was 11.2 g/dl, up from 9.7 g/dl last month. She is seeing hematology didn't need an iron transfusion. She is s/p an office D&C for heavy bleeding in 4/18, pathology returned with proliferative endometrium. She was on daily aygestin to try and prevent another heavy episode of bleeding prior to her surgery. She stopped taking it about 2 weeks ago.  U/S on 01/18/17 revealed multiple fibroids, the largest was 4.5 cm. She was also noted to have a complex right ovarian cyst, likely hemorrhagic CL. Her uterus is 16 week sized on exam.  She had a low TSH in April, other TFT's were normal. Endocrinologist is just going to follow her lab work.  She had a negative pap and negative hpv in 11/17.  She has a long h/o a right bartholin's cyst, recently getting bigger and bothering her. She would like that fixed in the OR as well.   GYNECOLOGIC HISTORY: Patient's last menstrual period was 12/29/2016. Contraception:None, typically uses w/d. Will abstain between now and surgery.  Menopausal hormone therapy: none         OB History    Gravida Para Term Preterm AB Living   2 1     1 1    SAB TAB Ectopic Multiple Live Births     1     1       C/S secondary to arrest of dilation at 5 cm. Baby was 7 lb 11 oz.  Patient Active Problem List   Diagnosis Date Noted  . Pain in joint, lower leg 03/15/2013  . Encounter for long-term (current) use of other medications 06/23/2012  . Routine general medical examination at a health care facility 06/23/2012  . Screening examination for infectious disease 06/23/2012  . HYPERCHOLESTEROLEMIA 07/30/2010  . Iron deficiency anemia 07/30/2010  . HEADACHE 03/19/2008  . Hyperthyroidism 07/10/2007  . MYALGIA 07/10/2007  . Diabetes  ([place]) 04/06/2007  . HYPERTENSION 04/06/2007  . URINARY INCONTINENCE 04/06/2007  She sometimes has urge incontinence,  Particularly when she is traveling. Only takes ditropan as needed. No GSI.  Past Medical History:  Diagnosis Date  . Abnormal Pap smear of cervix 10-02-07   ASCUS  . ANEMIA, IRON DEFICIENCY   . DIABETES MELLITUS, TYPE II   . Fibroid   . Headache(784.0)   . HYPERCHOLESTEROLEMIA   . HYPERTENSION   . Migraines   . Unspecified disorder of thyroid   . URINARY INCONTINENCE     Past Surgical History:  Procedure Laterality Date  . APPENDECTOMY    . CESAREAN SECTION  1998  . TONSILLECTOMY AND ADENOIDECTOMY      Current Outpatient Prescriptions  Medication Sig Dispense Refill  . glucose blood (ONETOUCH VERIO) test strip Use to check blood sugar 1 time per day. (Patient taking differently: 1 each by Other route daily. Use to check blood sugar 1 time per day.) 100 each 2  . hydrochlorothiazide (HYDRODIURIL) 25 MG tablet Take 1 tablet (25 mg total) by mouth daily. for high blood pressure 30 tablet 11  . IRON PO Take 1 tablet by mouth daily.     . metFORMIN (GLUCOPHAGE-XR) 500 MG 24 hr tablet TAKE (2) TABLETS TWICE DAILY. 120 tablet 11  . naproxen sodium (ANAPROX) 220 MG tablet Take 440 mg  by mouth 2 (two) times daily as needed (for pain.).    Marland Kitchen norethindrone (AYGESTIN) 5 MG tablet 1 tablet 1-2 x a day to control bleeding (Patient taking differently: Take 5 mg by mouth 2 (two) times daily as needed (to control bleeding,.). 1 tablet 1-2 x a day to control bleeding) 30 tablet 1  . oxybutynin (DITROPAN) 5 MG tablet Take 1 tablet (5 mg total) by mouth 2 (two) times daily. (Patient taking differently: Take 5 mg by mouth 2 (two) times daily as needed (for urinary frequency). ) 60 tablet 11  . SUMAtriptan (IMITREX) 100 MG tablet TAKE 1 TABLET AT ONSET-MAY REPEAT IN 2 HOURS AS NEEDED- NO MORE THAN 2 IN 24 HOURS. (Patient taking differently: Take 100 mg by mouth See admin instructions.  TAKE 1 TABLET AT ONSET-MAY REPEAT IN 2 HOURS AS NEEDED- NO MORE THAN 2 IN 24 HOURS.) 30 tablet 2   No current facility-administered medications for this visit.      ALLERGIES: Patient has no known allergies.  Family History  Problem Relation Age of Onset  . Diabetes Father   . Colon cancer Father 74       2002  . Hypertension Father   . Stroke Father   . Heart disease Father        stints  . Diabetes Mother   . Thyroid disease Mother   . Hypertension Mother   . Breast cancer Maternal Aunt   . Breast cancer Maternal Aunt   . Breast cancer Maternal Aunt   . Breast cancer Maternal Aunt   . Breast cancer Maternal Aunt   Mom had 8 sisters, 6 of them had breast cancer. Age range 50-79. Only 1 of 73 1st cousins has breast cancer (55's) Mom and 2 other sisters are fine.   Social History   Social History  . Marital status: Married    Spouse name: N/A  . Number of children: 1  . Years of education: N/A   Occupational History  . [employer]   Social History Main Topics  . Smoking status: Never Smoker  . Smokeless tobacco: Never Used  . Alcohol use No  . Drug use: No  . Sexual activity: Yes    Partners: Male     Comment: withdrawal   Other Topics Concern  . Not on file   Social History Narrative   Occupation: Works Orthoptist   Regular exercise-yes          Review of Systems  Constitutional: Negative.   HENT: Negative.   Eyes: Negative.   Respiratory: Negative.   Cardiovascular: Negative.   Gastrointestinal: Negative.   Genitourinary: Negative.   Musculoskeletal: Negative.   Skin: Negative.   Neurological: Negative.   Endo/Heme/Allergies: Negative.   Psychiatric/Behavioral: Negative.     PHYSICAL EXAMINATION:    BP (!) 150/82 (BP Location: Right Arm, Patient Position: Sitting, Cuff Size: Normal)   Pulse 80   Resp 16   Wt 163 lb (73.9 kg)   LMP 12/29/2016   BMI 26.31 kg/m     General appearance: alert, cooperative  and appears stated age Neck: no adenopathy, supple, symmetrical, trachea midline and thyroid normal to inspection and palpation Heart: regular rate and rhythm Lungs: CTAB Abdomen: soft, 16 week sized, irregularly shaped uterus palpated in her lower abdomen. Very tender on the left side of her uterus (unchanged) Extremities: normal, atraumatic, no cyanosis Skin: normal color, texture and turgor, no rashes or lesions Lymph: normal cervical supraclavicular  and inguinal nodes Neurologic: grossly normal Pelvic: limited to evaluation of the right bartholin's cyst, 2.5 cm, not tender. No signs of infection.   ASSESSMENT Symptomatic fibroid uterus Abnormal uterine bleeding Anemia, improved with oral iron and progesterone treatment 3.8 cm right ovarian cyst on her last ultrasound, suspected hemorrhagic CL H/O Type II diabetes, well controlled Hypertension, well controlled Family history of breast cancer, discussed the possibility of a genetic mutation and the possibility of removal of her ovaries. She declines genetic counseling or testing at this time. Wishes to retain her ovaries     PLAN Discussed total laparoscopic hysterectomy, bilateral salpingectomies, possible removal or one or both ovaries and cystoscopy. Reviewed the risks of the procedure, including infection, bleeding, damage to bowel/badder/vessels/ureters.  Discussed the possible need for laparotomy. Discussed post operative recovery and risk of cuff dehiscence. All of her questions were answered Recent HgbA1C was 5.3 % 2 weeks ago Will add marsupialization of the right bartholin's cyst to the consent.  Cleared for surgery by Dr Loanne Drilling, normal EKG   An After Visit Summary was printed and given to the patient.   CC: Dr Renato Shin

## 2017-02-09 ENCOUNTER — Telehealth: Payer: Self-pay | Admitting: *Deleted

## 2017-02-09 NOTE — Telephone Encounter (Signed)
Call to patient. Per DPR can leave message on voice mail. Voice mail message confirms first and last name. Left message that surgery time has been moved up to 0730 and will need to arrive at 0600. Requested call back to confirm receipt of message.

## 2017-02-09 NOTE — H&P (Signed)
GYNECOLOGY  VISIT   HPI: 51 y.o.   Married  [place] American  female   [phone] with Patient's last menstrual period was 12/29/2016.   here for a pre-operative visit. She has a symptomatic fibroid uterus with menorrhagia leading to anemia. Recent hgb was 11.2 g/dl, up from 9.7 g/dl last month. She is seeing hematology didn't need an iron transfusion. She is s/p an office D&C for heavy bleeding in 4/18, pathology returned with proliferative endometrium. She was on daily aygestin to try and prevent another heavy episode of bleeding prior to her surgery. She stopped taking it about 2 weeks ago.  U/S on 01/18/17 revealed multiple fibroids, the largest was 4.5 cm. She was also noted to have a complex right ovarian cyst, likely hemorrhagic CL. Her uterus is 16 week sized on exam.  She had a low TSH in April, other TFT's were normal. Endocrinologist is just going to follow her lab work.  She had a negative pap and negative hpv in 11/17.  She has a long h/o a right bartholin's cyst, recently getting bigger and bothering her. She would like that fixed in the OR as well.   GYNECOLOGIC HISTORY: Patient's last menstrual period was 12/29/2016. Contraception:None, typically uses w/d. Will abstain between now and surgery.  Menopausal hormone therapy: none         OB History    Gravida Para Term Preterm AB Living   2 1     1 1    SAB TAB Ectopic Multiple Live Births     1     1       C/S secondary to arrest of dilation at 5 cm. Baby was 7 lb 11 oz.  Patient Active Problem List   Diagnosis Date Noted  . Pain in joint, lower leg 03/15/2013  . Encounter for long-term (current) use of other medications 06/23/2012  . Routine general medical examination at a health care facility 06/23/2012  . Screening examination for infectious disease 06/23/2012  . HYPERCHOLESTEROLEMIA 07/30/2010  . Iron deficiency anemia 07/30/2010  . HEADACHE 03/19/2008  . Hyperthyroidism 07/10/2007  . MYALGIA 07/10/2007  . Diabetes  ([place]) 04/06/2007  . HYPERTENSION 04/06/2007  . URINARY INCONTINENCE 04/06/2007  She sometimes has urge incontinence,  Particularly when she is traveling. Only takes ditropan as needed. No GSI.  Past Medical History:  Diagnosis Date  . Abnormal Pap smear of cervix 10-02-07   ASCUS  . ANEMIA, IRON DEFICIENCY   . DIABETES MELLITUS, TYPE II   . Fibroid   . Headache(784.0)   . HYPERCHOLESTEROLEMIA   . HYPERTENSION   . Migraines   . Unspecified disorder of thyroid   . URINARY INCONTINENCE     Past Surgical History:  Procedure Laterality Date  . APPENDECTOMY    . CESAREAN SECTION  1998  . TONSILLECTOMY AND ADENOIDECTOMY      Current Outpatient Prescriptions  Medication Sig Dispense Refill  . glucose blood (ONETOUCH VERIO) test strip Use to check blood sugar 1 time per day. (Patient taking differently: 1 each by Other route daily. Use to check blood sugar 1 time per day.) 100 each 2  . hydrochlorothiazide (HYDRODIURIL) 25 MG tablet Take 1 tablet (25 mg total) by mouth daily. for high blood pressure 30 tablet 11  . IRON PO Take 1 tablet by mouth daily.     . metFORMIN (GLUCOPHAGE-XR) 500 MG 24 hr tablet TAKE (2) TABLETS TWICE DAILY. 120 tablet 11  . naproxen sodium (ANAPROX) 220 MG tablet Take 440 mg  by mouth 2 (two) times daily as needed (for pain.).    Marland Kitchen norethindrone (AYGESTIN) 5 MG tablet 1 tablet 1-2 x a day to control bleeding (Patient taking differently: Take 5 mg by mouth 2 (two) times daily as needed (to control bleeding,.). 1 tablet 1-2 x a day to control bleeding) 30 tablet 1  . oxybutynin (DITROPAN) 5 MG tablet Take 1 tablet (5 mg total) by mouth 2 (two) times daily. (Patient taking differently: Take 5 mg by mouth 2 (two) times daily as needed (for urinary frequency). ) 60 tablet 11  . SUMAtriptan (IMITREX) 100 MG tablet TAKE 1 TABLET AT ONSET-MAY REPEAT IN 2 HOURS AS NEEDED- NO MORE THAN 2 IN 24 HOURS. (Patient taking differently: Take 100 mg by mouth See admin instructions.  TAKE 1 TABLET AT ONSET-MAY REPEAT IN 2 HOURS AS NEEDED- NO MORE THAN 2 IN 24 HOURS.) 30 tablet 2   No current facility-administered medications for this visit.      ALLERGIES: Patient has no known allergies.  Family History  Problem Relation Age of Onset  . Diabetes Father   . Colon cancer Father 6       2002  . Hypertension Father   . Stroke Father   . Heart disease Father        stints  . Diabetes Mother   . Thyroid disease Mother   . Hypertension Mother   . Breast cancer Maternal Aunt   . Breast cancer Maternal Aunt   . Breast cancer Maternal Aunt   . Breast cancer Maternal Aunt   . Breast cancer Maternal Aunt   Mom had 8 sisters, 6 of them had breast cancer. Age range 50-79. Only 1 of 34 1st cousins has breast cancer (41's) Mom and 2 other sisters are fine.   Social History   Social History  . Marital status: Married    Spouse name: N/A  . Number of children: 1  . Years of education: N/A   Occupational History  . [employer]   Social History Main Topics  . Smoking status: Never Smoker  . Smokeless tobacco: Never Used  . Alcohol use No  . Drug use: No  . Sexual activity: Yes    Partners: Male     Comment: withdrawal   Other Topics Concern  . Not on file   Social History Narrative   Occupation: Works Orthoptist   Regular exercise-yes          Review of Systems  Constitutional: Negative.   HENT: Negative.   Eyes: Negative.   Respiratory: Negative.   Cardiovascular: Negative.   Gastrointestinal: Negative.   Genitourinary: Negative.   Musculoskeletal: Negative.   Skin: Negative.   Neurological: Negative.   Endo/Heme/Allergies: Negative.   Psychiatric/Behavioral: Negative.     PHYSICAL EXAMINATION:    BP (!) 150/82 (BP Location: Right Arm, Patient Position: Sitting, Cuff Size: Normal)   Pulse 80   Resp 16   Wt 163 lb (73.9 kg)   LMP 12/29/2016   BMI 26.31 kg/m     General appearance: alert, cooperative  and appears stated age Neck: no adenopathy, supple, symmetrical, trachea midline and thyroid normal to inspection and palpation Heart: regular rate and rhythm Lungs: CTAB Abdomen: soft, 16 week sized, irregularly shaped uterus palpated in her lower abdomen. Very tender on the left side of her uterus (unchanged) Extremities: normal, atraumatic, no cyanosis Skin: normal color, texture and turgor, no rashes or lesions Lymph: normal cervical supraclavicular  and inguinal nodes Neurologic: grossly normal Pelvic: limited to evaluation of the right bartholin's cyst, 2.5 cm, not tender. No signs of infection.   ASSESSMENT Symptomatic fibroid uterus Abnormal uterine bleeding Anemia, improved with oral iron and progesterone treatment 3.8 cm right ovarian cyst on her last ultrasound, suspected hemorrhagic CL H/O Type II diabetes, well controlled Hypertension, well controlled Family history of breast cancer, discussed the possibility of a genetic mutation and the possibility of removal of her ovaries. She declines genetic counseling or testing at this time. Wishes to retain her ovaries     PLAN Discussed total laparoscopic hysterectomy, bilateral salpingectomies, possible removal or one or both ovaries and cystoscopy. Reviewed the risks of the procedure, including infection, bleeding, damage to bowel/badder/vessels/ureters.  Discussed the possible need for laparotomy. Discussed post operative recovery and risk of cuff dehiscence. All of her questions were answered Recent HgbA1C was 5.3 % 2 weeks ago Will add marsupialization of the right bartholin's cyst to the consent.  Cleared for surgery by Dr Loanne Drilling, normal EKG   An After Visit Summary was printed and given to the patient.   CC: Dr Renato Shin

## 2017-02-10 ENCOUNTER — Encounter (HOSPITAL_COMMUNITY)
Admission: RE | Admit: 2017-02-10 | Discharge: 2017-02-10 | Disposition: A | Discharge status: Home / Self Care | Payer: 59 | Source: Ambulatory Visit | Attending: Obstetrics and Gynecology | Admitting: Obstetrics and Gynecology

## 2017-02-10 ENCOUNTER — Encounter (HOSPITAL_COMMUNITY): Payer: Self-pay

## 2017-02-10 DIAGNOSIS — D509 Iron deficiency anemia, unspecified: Secondary | ICD-10-CM | POA: Diagnosis not present

## 2017-02-10 DIAGNOSIS — E7800 Pure hypercholesterolemia, unspecified: Secondary | ICD-10-CM | POA: Diagnosis not present

## 2017-02-10 DIAGNOSIS — E0590 Thyrotoxicosis, unspecified without thyrotoxic crisis or storm: Secondary | ICD-10-CM | POA: Insufficient documentation

## 2017-02-10 DIAGNOSIS — D259 Leiomyoma of uterus, unspecified: Secondary | ICD-10-CM | POA: Diagnosis not present

## 2017-02-10 DIAGNOSIS — Z01812 Encounter for preprocedural laboratory examination: Secondary | ICD-10-CM | POA: Insufficient documentation

## 2017-02-10 DIAGNOSIS — I10 Essential (primary) hypertension: Secondary | ICD-10-CM | POA: Insufficient documentation

## 2017-02-10 DIAGNOSIS — E119 Type 2 diabetes mellitus without complications: Secondary | ICD-10-CM | POA: Insufficient documentation

## 2017-02-10 HISTORY — DX: Thyrotoxicosis, unspecified without thyrotoxic crisis or storm: E05.90

## 2017-02-10 LAB — COMPREHENSIVE METABOLIC PANEL
ALK PHOS: 42 U/L (ref 38–126)
ALT: 15 U/L (ref 14–54)
ANION GAP: 3 — AB (ref 5–15)
AST: 15 U/L (ref 15–41)
Albumin: 4.6 g/dL (ref 3.5–5.0)
BILIRUBIN TOTAL: 0.2 mg/dL — AB (ref 0.3–1.2)
BUN: 9 mg/dL (ref 6–20)
CALCIUM: 9.5 mg/dL (ref 8.9–10.3)
CO2: 27 mmol/L (ref 22–32)
CREATININE: 0.92 mg/dL (ref 0.44–1.00)
Chloride: 105 mmol/L (ref 101–111)
GFR calc Af Amer: >60 mL/min (ref >60)
GFR calc non Af Amer: >60 mL/min (ref >60)
Glucose, Bld: 93 mg/dL (ref 65–99)
Potassium: 3.4 mmol/L — ABNORMAL LOW (ref 3.5–5.1)
Sodium: 135 mmol/L (ref 135–145)
TOTAL PROTEIN: 7.7 g/dL (ref 6.5–8.1)

## 2017-02-10 LAB — CBC
HCT: 37.2 % (ref 36.0–46.0)
HEMOGLOBIN: 11.9 g/dL — AB (ref 12.0–15.0)
MCH: 25.9 pg — ABNORMAL LOW (ref 26.0–34.0)
MCHC: 32 g/dL (ref 30.0–36.0)
MCV: 81 fL (ref 78.0–100.0)
Platelets: 329 10*3/uL (ref 150–400)
RBC: 4.59 MIL/uL (ref 3.87–5.11)
RDW: 14.4 % (ref 11.5–15.5)
WBC: 7 10*3/uL (ref 4.0–10.5)

## 2017-02-10 NOTE — Patient Instructions (Addendum)
Your procedure is scheduled on: Tuesday February 22, 2017 at 7:30 am  Enter through the [employer] [facility] at: 6 am  Pick up the phone at the desk and dial [phone].  Call this number if you have problems the morning of surgery: [phone].  Remember: Do NOT eat food or drink any liquids: after Midnight on Monday June 25 Do NOT drink clear liquids after: Take these medicines the morning of surgery with a SIP OF WATER: None  Stop all vitamins and supplements 1 week prior to surgery  Do NOT wear jewelry (body piercing), metal hair clips/bobby pins, make-up, or nail polish. Do NOT wear lotions, powders, or perfumes.  You may wear deoderant. Do NOT shave for 48 hours prior to surgery. Do NOT bring valuables to the hospital. Contacts, dentures, or bridgework may not be worn into surgery.  Leave suitcase in car.  After surgery it may be brought to your room.  For patients admitted to the hospital, checkout time is 11:00 AM the day of discharge.

## 2017-02-11 NOTE — Telephone Encounter (Signed)
Call to patient. Confirmed patient is aware and agreeable to surgery time change to 0730 and arrival at 0600.  Routing to provider for final review. Patient agreeable to disposition. Will close encounter.

## 2017-02-14 ENCOUNTER — Telehealth: Payer: Self-pay | Admitting: Endocrinology

## 2017-02-14 DIAGNOSIS — E876 Hypokalemia: Secondary | ICD-10-CM | POA: Insufficient documentation

## 2017-02-14 NOTE — Telephone Encounter (Signed)
please call patient: Dr Talbert Nan found your potassium was low last week.  Please come back next week for a recheck.  I have ordered.

## 2017-02-14 NOTE — Telephone Encounter (Signed)
I contacted the patient and advised of message. She voiced understanding and scheduled her labs for 02/21/2017.

## 2017-02-21 ENCOUNTER — Other Ambulatory Visit: Payer: 59

## 2017-02-21 NOTE — Anesthesia Preprocedure Evaluation (Addendum)
Anesthesia Evaluation  Patient identified by MRN, date of birth, ID band Patient awake    Reviewed: Allergy & Precautions, NPO status , Patient's Chart, lab work & pertinent test results  Airway Mallampati: II  TM Distance: >3 FB Neck ROM: Full    Dental  (+) Dental Advisory Given   Pulmonary neg pulmonary ROS,    breath sounds clear to auscultation       Cardiovascular hypertension, Pt. on medications  Rhythm:Regular Rate:Normal     Neuro/Psych  Headaches,    GI/Hepatic negative GI ROS, Neg liver ROS,   Endo/Other  diabetes, Type 2, Oral Hypoglycemic Agents  Renal/GU negative Renal ROS     Musculoskeletal   Abdominal   Peds  Hematology  (+) anemia ,   Anesthesia Other Findings   Reproductive/Obstetrics                            Lab Results  Component Value Date   WBC 7.0 02/10/2017   HGB 11.9 (L) 02/10/2017   HCT 37.2 02/10/2017   MCV 81.0 02/10/2017   PLT 329 02/10/2017   Lab Results  Component Value Date   CREATININE 0.92 02/10/2017   BUN 9 02/10/2017   NA 135 02/10/2017   K 3.4 (L) 02/10/2017   CL 105 02/10/2017   CO2 27 02/10/2017    Anesthesia Physical Anesthesia Plan  ASA: II  Anesthesia Plan: General   Post-op Pain Management:    Induction: Intravenous  PONV Risk Score and Plan: 4 or greater and Ondansetron, Dexamethasone, Propofol, Midazolam and Scopolamine patch - Pre-op  Airway Management Planned: Oral ETT  Additional Equipment:   Intra-op Plan:   Post-operative Plan: Extubation in OR  Informed Consent: I have reviewed the patients History and Physical, chart, labs and discussed the procedure including the risks, benefits and alternatives for the proposed anesthesia with the patient or authorized representative who has indicated his/her understanding and acceptance.   Dental advisory given  Plan Discussed with: CRNA  Anesthesia Plan Comments:         Anesthesia Quick Evaluation

## 2017-02-22 ENCOUNTER — Ambulatory Visit (HOSPITAL_COMMUNITY)
Admission: RE | Admit: 2017-02-22 | Discharge: 2017-02-23 | Disposition: A | Discharge status: Home / Self Care | Payer: 59 | Source: Ambulatory Visit | Attending: Obstetrics and Gynecology | Admitting: Obstetrics and Gynecology

## 2017-02-22 ENCOUNTER — Encounter (HOSPITAL_COMMUNITY)
Admission: RE | Disposition: A | Discharge status: Home / Self Care | Payer: Self-pay | Source: Ambulatory Visit | Attending: Obstetrics and Gynecology

## 2017-02-22 ENCOUNTER — Encounter (HOSPITAL_COMMUNITY): Payer: Self-pay

## 2017-02-22 ENCOUNTER — Ambulatory Visit (HOSPITAL_COMMUNITY): Payer: 59 | Admitting: Anesthesiology

## 2017-02-22 DIAGNOSIS — E876 Hypokalemia: Secondary | ICD-10-CM | POA: Insufficient documentation

## 2017-02-22 DIAGNOSIS — D251 Intramural leiomyoma of uterus: Secondary | ICD-10-CM | POA: Insufficient documentation

## 2017-02-22 DIAGNOSIS — D500 Iron deficiency anemia secondary to blood loss (chronic): Secondary | ICD-10-CM | POA: Diagnosis not present

## 2017-02-22 DIAGNOSIS — N809 Endometriosis, unspecified: Secondary | ICD-10-CM | POA: Diagnosis not present

## 2017-02-22 DIAGNOSIS — Z803 Family history of malignant neoplasm of breast: Secondary | ICD-10-CM | POA: Insufficient documentation

## 2017-02-22 DIAGNOSIS — N920 Excessive and frequent menstruation with regular cycle: Secondary | ICD-10-CM | POA: Diagnosis not present

## 2017-02-22 DIAGNOSIS — Z79899 Other long term (current) drug therapy: Secondary | ICD-10-CM | POA: Insufficient documentation

## 2017-02-22 DIAGNOSIS — N926 Irregular menstruation, unspecified: Secondary | ICD-10-CM | POA: Diagnosis not present

## 2017-02-22 DIAGNOSIS — D509 Iron deficiency anemia, unspecified: Secondary | ICD-10-CM | POA: Insufficient documentation

## 2017-02-22 DIAGNOSIS — Z833 Family history of diabetes mellitus: Secondary | ICD-10-CM | POA: Insufficient documentation

## 2017-02-22 DIAGNOSIS — D259 Leiomyoma of uterus, unspecified: Secondary | ICD-10-CM | POA: Diagnosis not present

## 2017-02-22 DIAGNOSIS — I10 Essential (primary) hypertension: Secondary | ICD-10-CM | POA: Diagnosis not present

## 2017-02-22 DIAGNOSIS — E119 Type 2 diabetes mellitus without complications: Secondary | ICD-10-CM | POA: Diagnosis not present

## 2017-02-22 DIAGNOSIS — K660 Peritoneal adhesions (postprocedural) (postinfection): Secondary | ICD-10-CM | POA: Diagnosis not present

## 2017-02-22 DIAGNOSIS — N750 Cyst of Bartholin's gland: Secondary | ICD-10-CM | POA: Diagnosis not present

## 2017-02-22 DIAGNOSIS — N736 Female pelvic peritoneal adhesions (postinfective): Secondary | ICD-10-CM | POA: Diagnosis not present

## 2017-02-22 DIAGNOSIS — Z7984 Long term (current) use of oral hypoglycemic drugs: Secondary | ICD-10-CM | POA: Insufficient documentation

## 2017-02-22 DIAGNOSIS — N803 Endometriosis of pelvic peritoneum: Secondary | ICD-10-CM

## 2017-02-22 DIAGNOSIS — Z90710 Acquired absence of both cervix and uterus: Secondary | ICD-10-CM | POA: Diagnosis present

## 2017-02-22 DIAGNOSIS — G43909 Migraine, unspecified, not intractable, without status migrainosus: Secondary | ICD-10-CM | POA: Insufficient documentation

## 2017-02-22 HISTORY — PX: TOTAL LAPAROSCOPIC HYSTERECTOMY WITH SALPINGECTOMY: SHX6742

## 2017-02-22 HISTORY — PX: OOPHORECTOMY: SHX6387

## 2017-02-22 HISTORY — PX: CYSTOSCOPY: SHX5120

## 2017-02-22 HISTORY — PX: BARTHOLIN CYST MARSUPIALIZATION: SHX5383

## 2017-02-22 HISTORY — PX: LAPAROSCOPIC LYSIS OF ADHESIONS: SHX5905

## 2017-02-22 LAB — GLUCOSE, CAPILLARY: Glucose-Capillary: 122 mg/dL — ABNORMAL HIGH (ref 65–99)

## 2017-02-22 LAB — CBC
HEMATOCRIT: 36.8 % (ref 36.0–46.0)
Hemoglobin: 12 g/dL (ref 12.0–15.0)
MCH: 26.1 pg (ref 26.0–34.0)
MCHC: 32.6 g/dL (ref 30.0–36.0)
MCV: 80 fL (ref 78.0–100.0)
Platelets: 354 10*3/uL (ref 150–400)
RBC: 4.6 MIL/uL (ref 3.87–5.11)
RDW: 14.3 % (ref 11.5–15.5)
WBC: 5.5 10*3/uL (ref 4.0–10.5)

## 2017-02-22 LAB — PREGNANCY, URINE: Preg Test, Ur: NEGATIVE

## 2017-02-22 SURGERY — HYSTERECTOMY, TOTAL, LAPAROSCOPIC, WITH SALPINGECTOMY
Anesthesia: General | Site: Vulva | Laterality: Right

## 2017-02-22 MED ORDER — FENTANYL CITRATE (PF) 250 MCG/5ML IJ SOLN
INTRAMUSCULAR | Status: AC
  Filled 2017-02-22: qty 5

## 2017-02-22 MED ORDER — PANTOPRAZOLE SODIUM 40 MG IV SOLR
40.0000 mg | Freq: Every day | INTRAVENOUS | Status: DC
  Administered 2017-02-22: 40 mg via INTRAVENOUS
  Filled 2017-02-22: qty 40

## 2017-02-22 MED ORDER — GLYCOPYRROLATE 0.2 MG/ML IJ SOLN
INTRAMUSCULAR | Status: AC
  Filled 2017-02-22: qty 1

## 2017-02-22 MED ORDER — SODIUM CHLORIDE 0.9 % IJ SOLN
INTRAMUSCULAR | Status: AC
  Filled 2017-02-22: qty 50

## 2017-02-22 MED ORDER — ONDANSETRON HCL 4 MG PO TABS: 4.0000 mg | ORAL_TABLET | Freq: Four times a day (QID) | ORAL | Status: DC | PRN

## 2017-02-22 MED ORDER — MENTHOL 3 MG MT LOZG: 1.0000 | LOZENGE | OROMUCOSAL | Status: DC | PRN

## 2017-02-22 MED ORDER — SOD CITRATE-CITRIC ACID 500-334 MG/5ML PO SOLN
30.0000 mL | ORAL | Status: AC
  Administered 2017-02-22: 30 mL via ORAL

## 2017-02-22 MED ORDER — EPHEDRINE SULFATE 50 MG/ML IJ SOLN
INTRAMUSCULAR | Status: DC | PRN
  Administered 2017-02-22 (×2): 5 mg via INTRAVENOUS

## 2017-02-22 MED ORDER — SUGAMMADEX SODIUM 200 MG/2ML IV SOLN
INTRAVENOUS | Status: AC
  Filled 2017-02-22: qty 2

## 2017-02-22 MED ORDER — LACTATED RINGERS IV SOLN
INTRAVENOUS | Status: DC
  Administered 2017-02-22: 125 mL/h via INTRAVENOUS
  Administered 2017-02-22 (×2): via INTRAVENOUS

## 2017-02-22 MED ORDER — PROPOFOL 10 MG/ML IV BOLUS
INTRAVENOUS | Status: DC | PRN
  Administered 2017-02-22: 170 mg via INTRAVENOUS

## 2017-02-22 MED ORDER — ALUM & MAG HYDROXIDE-SIMETH 200-200-20 MG/5ML PO SUSP: 30.0000 mL | ORAL | Status: DC | PRN

## 2017-02-22 MED ORDER — HYDROCHLOROTHIAZIDE 25 MG PO TABS
25.0000 mg | ORAL_TABLET | Freq: Every day | ORAL | Status: DC
  Administered 2017-02-22: 25 mg via ORAL
  Filled 2017-02-22: qty 1

## 2017-02-22 MED ORDER — SODIUM CHLORIDE 0.9 % IJ SOLN
INTRAMUSCULAR | Status: DC | PRN
  Administered 2017-02-22: 10 mL

## 2017-02-22 MED ORDER — BUPIVACAINE HCL (PF) 0.25 % IJ SOLN
INTRAMUSCULAR | Status: AC
  Filled 2017-02-22: qty 30

## 2017-02-22 MED ORDER — LIDOCAINE HCL 1 % IJ SOLN
INTRAMUSCULAR | Status: AC
  Filled 2017-02-22: qty 20

## 2017-02-22 MED ORDER — PROMETHAZINE HCL 25 MG/ML IJ SOLN: 6.2500 mg | INTRAMUSCULAR | Status: DC | PRN

## 2017-02-22 MED ORDER — KETOROLAC TROMETHAMINE 30 MG/ML IJ SOLN
INTRAMUSCULAR | Status: AC
  Filled 2017-02-22: qty 1

## 2017-02-22 MED ORDER — DEXTROSE 5 % IV SOLN
2.0000 g | Freq: Once | INTRAVENOUS | Status: AC
  Administered 2017-02-22: 2 g via INTRAVENOUS
  Filled 2017-02-22: qty 2

## 2017-02-22 MED ORDER — ONDANSETRON HCL 4 MG/2ML IJ SOLN
INTRAMUSCULAR | Status: AC
  Filled 2017-02-22: qty 2

## 2017-02-22 MED ORDER — CEFOTETAN DISODIUM-DEXTROSE 2-2.08 GM-% IV SOLR
2.0000 g | INTRAVENOUS | Status: AC
  Administered 2017-02-22: 2 g via INTRAVENOUS

## 2017-02-22 MED ORDER — ENOXAPARIN SODIUM 40 MG/0.4ML ~~LOC~~ SOLN
40.0000 mg | SUBCUTANEOUS | Status: AC
  Administered 2017-02-22: 40 mg via SUBCUTANEOUS
  Filled 2017-02-22: qty 0.4

## 2017-02-22 MED ORDER — KETOROLAC TROMETHAMINE 30 MG/ML IJ SOLN
INTRAMUSCULAR | Status: DC | PRN
  Administered 2017-02-22: 30 mg via INTRAVENOUS

## 2017-02-22 MED ORDER — LACTATED RINGERS IV SOLN: INTRAVENOUS | Status: DC

## 2017-02-22 MED ORDER — HYDROMORPHONE HCL 1 MG/ML IJ SOLN
INTRAMUSCULAR | Status: AC
  Filled 2017-02-22: qty 0.5

## 2017-02-22 MED ORDER — DOCUSATE SODIUM 100 MG PO CAPS
100.0000 mg | ORAL_CAPSULE | Freq: Two times a day (BID) | ORAL | Status: DC
  Administered 2017-02-22: 100 mg via ORAL
  Filled 2017-02-22: qty 1

## 2017-02-22 MED ORDER — ACETAMINOPHEN 325 MG PO TABS: 650.0000 mg | ORAL_TABLET | ORAL | Status: DC | PRN

## 2017-02-22 MED ORDER — EPHEDRINE 5 MG/ML INJ
INTRAVENOUS | Status: AC
Start: 2017-02-22 — End: 2017-02-22
  Filled 2017-02-22: qty 10

## 2017-02-22 MED ORDER — CEFOTETAN DISODIUM-DEXTROSE 2-2.08 GM-% IV SOLR
INTRAVENOUS | Status: AC
  Filled 2017-02-22: qty 50

## 2017-02-22 MED ORDER — GLYCOPYRROLATE 0.2 MG/ML IJ SOLN
INTRAMUSCULAR | Status: DC | PRN
  Administered 2017-02-22: 0.1 mg via INTRAVENOUS

## 2017-02-22 MED ORDER — PROPOFOL 10 MG/ML IV BOLUS
INTRAVENOUS | Status: AC
  Filled 2017-02-22: qty 20

## 2017-02-22 MED ORDER — SOD CITRATE-CITRIC ACID 500-334 MG/5ML PO SOLN
ORAL | Status: AC
  Filled 2017-02-22: qty 15

## 2017-02-22 MED ORDER — ONDANSETRON HCL 4 MG/2ML IJ SOLN: 4.0000 mg | Freq: Four times a day (QID) | INTRAMUSCULAR | Status: DC | PRN

## 2017-02-22 MED ORDER — MIDAZOLAM HCL 2 MG/2ML IJ SOLN
INTRAMUSCULAR | Status: AC
  Filled 2017-02-22: qty 2

## 2017-02-22 MED ORDER — DEXAMETHASONE SODIUM PHOSPHATE 10 MG/ML IJ SOLN
INTRAMUSCULAR | Status: DC | PRN
  Administered 2017-02-22: 10 mg via INTRAVENOUS

## 2017-02-22 MED ORDER — HYDROMORPHONE HCL 1 MG/ML IJ SOLN: 0.2000 mg | INTRAMUSCULAR | Status: DC | PRN

## 2017-02-22 MED ORDER — LACTATED RINGERS IR SOLN
Status: DC | PRN
  Administered 2017-02-22: 3000 mL

## 2017-02-22 MED ORDER — KETOROLAC TROMETHAMINE 30 MG/ML IJ SOLN
30.0000 mg | Freq: Four times a day (QID) | INTRAMUSCULAR | Status: DC
  Administered 2017-02-22 – 2017-02-23 (×2): 30 mg via INTRAVENOUS
  Filled 2017-02-22 (×2): qty 1

## 2017-02-22 MED ORDER — DEXAMETHASONE SODIUM PHOSPHATE 4 MG/ML IJ SOLN
INTRAMUSCULAR | Status: AC
  Filled 2017-02-22: qty 1

## 2017-02-22 MED ORDER — OXYCODONE-ACETAMINOPHEN 5-325 MG PO TABS
1.0000 | ORAL_TABLET | ORAL | Status: DC | PRN
Start: 2017-02-22 — End: 2017-02-23
  Administered 2017-02-22 – 2017-02-23 (×3): 2 via ORAL
  Filled 2017-02-22 (×3): qty 2

## 2017-02-22 MED ORDER — LIDOCAINE HCL (CARDIAC) 20 MG/ML IV SOLN
INTRAVENOUS | Status: DC | PRN
  Administered 2017-02-22: 100 mg via INTRAVENOUS

## 2017-02-22 MED ORDER — METFORMIN HCL ER 500 MG PO TB24
500.0000 mg | ORAL_TABLET | Freq: Every day | ORAL | Status: DC
  Administered 2017-02-23: 500 mg via ORAL
  Filled 2017-02-22: qty 1

## 2017-02-22 MED ORDER — ZOLPIDEM TARTRATE 5 MG PO TABS: 5.0000 mg | ORAL_TABLET | Freq: Every evening | ORAL | Status: DC | PRN

## 2017-02-22 MED ORDER — FENTANYL CITRATE (PF) 100 MCG/2ML IJ SOLN
INTRAMUSCULAR | Status: DC | PRN
  Administered 2017-02-22: 100 ug via INTRAVENOUS
  Administered 2017-02-22 (×4): 50 ug via INTRAVENOUS

## 2017-02-22 MED ORDER — SCOPOLAMINE 1 MG/3DAYS TD PT72
1.0000 | MEDICATED_PATCH | Freq: Once | TRANSDERMAL | Status: DC
  Administered 2017-02-22: 1.5 mg via TRANSDERMAL

## 2017-02-22 MED ORDER — SUGAMMADEX SODIUM 200 MG/2ML IV SOLN
INTRAVENOUS | Status: DC | PRN
  Administered 2017-02-22: 149 mg via INTRAVENOUS

## 2017-02-22 MED ORDER — LIDOCAINE HCL (CARDIAC) 20 MG/ML IV SOLN
INTRAVENOUS | Status: AC
  Filled 2017-02-22: qty 5

## 2017-02-22 MED ORDER — STERILE WATER FOR IRRIGATION IR SOLN
Status: DC | PRN
  Administered 2017-02-22: 1000 mL via INTRAVESICAL

## 2017-02-22 MED ORDER — BUPIVACAINE HCL (PF) 0.25 % IJ SOLN
INTRAMUSCULAR | Status: DC | PRN
  Administered 2017-02-22: 3 mL
  Administered 2017-02-22: 14 mL

## 2017-02-22 MED ORDER — KCL IN DEXTROSE-NACL 20-5-0.45 MEQ/L-%-% IV SOLN
INTRAVENOUS | Status: DC
  Administered 2017-02-22: 16:00:00 via INTRAVENOUS
  Filled 2017-02-22 (×2): qty 1000

## 2017-02-22 MED ORDER — ROCURONIUM BROMIDE 100 MG/10ML IV SOLN
INTRAVENOUS | Status: AC
  Filled 2017-02-22: qty 1

## 2017-02-22 MED ORDER — ONDANSETRON HCL 4 MG/2ML IJ SOLN
INTRAMUSCULAR | Status: DC | PRN
  Administered 2017-02-22: 4 mg via INTRAVENOUS

## 2017-02-22 MED ORDER — ROPIVACAINE HCL 5 MG/ML IJ SOLN
INTRAMUSCULAR | Status: AC
  Filled 2017-02-22: qty 30

## 2017-02-22 MED ORDER — ENOXAPARIN SODIUM 40 MG/0.4ML ~~LOC~~ SOLN
40.0000 mg | SUBCUTANEOUS | Status: AC
  Administered 2017-02-23: 40 mg via SUBCUTANEOUS
  Filled 2017-02-22: qty 0.4

## 2017-02-22 MED ORDER — ROCURONIUM BROMIDE 100 MG/10ML IV SOLN
INTRAVENOUS | Status: DC | PRN
  Administered 2017-02-22: 50 mg via INTRAVENOUS
  Administered 2017-02-22: 10 mg via INTRAVENOUS
  Administered 2017-02-22 (×2): 20 mg via INTRAVENOUS

## 2017-02-22 MED ORDER — SCOPOLAMINE 1 MG/3DAYS TD PT72
MEDICATED_PATCH | TRANSDERMAL | Status: AC
  Filled 2017-02-22: qty 1

## 2017-02-22 MED ORDER — HYDROMORPHONE HCL 1 MG/ML IJ SOLN
INTRAMUSCULAR | Status: AC
  Filled 2017-02-22: qty 1

## 2017-02-22 MED ORDER — KETOROLAC TROMETHAMINE 30 MG/ML IJ SOLN: 30.0000 mg | Freq: Four times a day (QID) | INTRAMUSCULAR | Status: DC

## 2017-02-22 MED ORDER — MIDAZOLAM HCL 2 MG/2ML IJ SOLN
INTRAMUSCULAR | Status: DC | PRN
  Administered 2017-02-22: 2 mg via INTRAVENOUS

## 2017-02-22 MED ORDER — HYDROMORPHONE HCL 1 MG/ML IJ SOLN
0.2500 mg | INTRAMUSCULAR | Status: DC | PRN
  Administered 2017-02-22: 0.25 mg via INTRAVENOUS
  Administered 2017-02-22 (×2): 0.5 mg via INTRAVENOUS

## 2017-02-22 SURGICAL SUPPLY — 87 items
ADH SKN CLS APL DERMABOND .7 (GAUZE/BANDAGES/DRESSINGS) ×5
APL SRG 38 LTWT LNG FL B (MISCELLANEOUS) ×5
APPLICATOR ARISTA FLEXITIP XL (MISCELLANEOUS) ×3 IMPLANT
BLADE SURG 15 STRL LF C SS BP (BLADE) ×5 IMPLANT
BLADE SURG 15 STRL SS (BLADE) ×7
CABLE HIGH FREQUENCY MONO STRZ (ELECTRODE) ×2 IMPLANT
CANISTER SUCT 3000ML PPV (MISCELLANEOUS) ×7 IMPLANT
CELL SAVER LIPIGURD (MISCELLANEOUS) ×5 IMPLANT
CLOTH BEACON ORANGE TIMEOUT ST (SAFETY) ×7 IMPLANT
CONTAINER PREFILL 10% NBF 15ML (MISCELLANEOUS) ×7 IMPLANT
COUNTER NEEDLE 1200 MAGNETIC (NEEDLE) IMPLANT
COVER LIGHT HANDLE  1/PK (MISCELLANEOUS) ×6
COVER LIGHT HANDLE 1/PK (MISCELLANEOUS) ×3 IMPLANT
COVER MAYO STAND STRL (DRAPES) ×7 IMPLANT
COVER TABLE BACK 60X90 (DRAPES) IMPLANT
DECANTER SPIKE VIAL GLASS SM (MISCELLANEOUS) ×21 IMPLANT
DERMABOND ADVANCED (GAUZE/BANDAGES/DRESSINGS) ×2
DERMABOND ADVANCED .7 DNX12 (GAUZE/BANDAGES/DRESSINGS) ×5 IMPLANT
DEVICE RETRIEVAL ALEXIS 14 (MISCELLANEOUS) IMPLANT
DURAPREP 26ML APPLICATOR (WOUND CARE) ×7 IMPLANT
ELECT REM PT RETURN 9FT ADLT (ELECTROSURGICAL) ×7
ELECTRODE REM PT RTRN 9FT ADLT (ELECTROSURGICAL) ×2 IMPLANT
EXTRT SYSTEM ALEXIS 14CM (MISCELLANEOUS) ×7
EXTRT SYSTEM ALEXIS 17CM (MISCELLANEOUS)
GLOVE BIOGEL PI IND STRL 7.0 (GLOVE) ×20 IMPLANT
GLOVE BIOGEL PI INDICATOR 7.0 (GLOVE) ×8
GLOVE ECLIPSE 6.5 STRL STRAW (GLOVE) ×7 IMPLANT
GOWN STRL REUS W/TWL LRG LVL3 (GOWN DISPOSABLE) ×28 IMPLANT
HARMONIC RUM II 2.5CM SILVER (DISPOSABLE)
HARMONIC RUM II 3.0CM SILVER (DISPOSABLE) ×7
HARMONIC RUM II 3.5CM SILVER (DISPOSABLE)
HARMONIC RUM II 4.0CM SILVER (DISPOSABLE)
HEMOSTAT ARISTA ABSORB 3G PWDR (MISCELLANEOUS) ×2 IMPLANT
LEGGING LITHOTOMY PAIR STRL (DRAPES) ×6 IMPLANT
LIGASURE VESSEL 5MM BLUNT TIP (ELECTROSURGICAL) ×7 IMPLANT
MANIPULATOR ADVINCU DEL 3.0 PL (MISCELLANEOUS) ×3 IMPLANT
MANIPULATOR ADVINCU DEL 3.0 SS (MISCELLANEOUS) ×2 IMPLANT
MANIPULATOR ADVINCU DEL 3.5 SS (MISCELLANEOUS) ×3 IMPLANT
NEEDLE INSUFFLATION 120MM (ENDOMECHANICALS) ×7 IMPLANT
NS IRRIG 1000ML POUR BTL (IV SOLUTION) ×7 IMPLANT
OCCLUDER COLPOPNEUMO (BALLOONS) ×7 IMPLANT
PACK LAPAROSCOPY BASIN (CUSTOM PROCEDURE TRAY) ×7 IMPLANT
PACK TRENDGUARD 450 HYBRID PRO (MISCELLANEOUS) ×1 IMPLANT
PACK TRENDGUARD 600 HYBRD PROC (MISCELLANEOUS) IMPLANT
PACK VAGINAL MINOR WOMEN LF (CUSTOM PROCEDURE TRAY) ×7 IMPLANT
PAD OB MATERNITY 4.3X12.25 (PERSONAL CARE ITEMS) ×7 IMPLANT
PAD PREP 24X48 CUFFED NSTRL (MISCELLANEOUS) ×7 IMPLANT
PENCIL BUTTON HOLSTER BLD 10FT (ELECTRODE) IMPLANT
POUCH LAPAROSCOPIC INSTRUMENT (MISCELLANEOUS) ×10 IMPLANT
PROTECTOR NERVE ULNAR (MISCELLANEOUS) ×17 IMPLANT
SCALPEL HRMNC RUM II 2.5 SILVR (DISPOSABLE) IMPLANT
SCALPEL HRMNC RUM II 3.0 SILVR (DISPOSABLE) ×1 IMPLANT
SCALPEL HRMNC RUM II 3.5 SILVR (DISPOSABLE) IMPLANT
SCALPEL HRMNC RUM II 4.0 SILVR (DISPOSABLE) IMPLANT
SCISSORS LAP 5X35 DISP (ENDOMECHANICALS) ×4 IMPLANT
SET CYSTO W/LG BORE CLAMP LF (SET/KITS/TRAYS/PACK) ×7 IMPLANT
SET IRRIG TUBING LAPAROSCOPIC (IRRIGATION / IRRIGATOR) ×10 IMPLANT
SET TRI-LUMEN FLTR TB AIRSEAL (TUBING) ×7 IMPLANT
SHEARS HARMONIC ACE PLUS 36CM (ENDOMECHANICALS) ×7 IMPLANT
SLEEVE SURGEON STRL (DRAPES) ×4 IMPLANT
SUT VIC AB 0 CT1 27 (SUTURE) ×7
SUT VIC AB 0 CT1 27XBRD ANBCTR (SUTURE) ×5 IMPLANT
SUT VICRYL 0 UR6 27IN ABS (SUTURE) ×7 IMPLANT
SUT VICRYL 4-0 PS2 18IN ABS (SUTURE) ×7 IMPLANT
SUT VLOC 180 0 9IN  GS21 (SUTURE)
SUT VLOC 180 0 9IN GS21 (SUTURE) IMPLANT
SWAB COLLECTION DEVICE MRSA (MISCELLANEOUS) IMPLANT
SWAB CULTURE ESWAB REG 1ML (MISCELLANEOUS) IMPLANT
SYR 50ML LL SCALE MARK (SYRINGE) ×14 IMPLANT
SYSTEM CONTND EXTRCTN KII BLLN (MISCELLANEOUS) IMPLANT
TIP RUMI ORANGE 6.7MMX12CM (TIP) IMPLANT
TIP UTERINE 5.1X6CM LAV DISP (MISCELLANEOUS) IMPLANT
TIP UTERINE 6.7X10CM GRN DISP (MISCELLANEOUS) ×3 IMPLANT
TIP UTERINE 6.7X6CM WHT DISP (MISCELLANEOUS) IMPLANT
TIP UTERINE 6.7X8CM BLUE DISP (MISCELLANEOUS) ×3 IMPLANT
TOWEL OR 17X24 6PK STRL BLUE (TOWEL DISPOSABLE) ×14 IMPLANT
TRAY FOLEY CATH SILVER 14FR (SET/KITS/TRAYS/PACK) ×7 IMPLANT
TRENDGUARD 450 HYBRID PRO PACK (MISCELLANEOUS) ×7
TRENDGUARD 600 HYBRID PROC PK (MISCELLANEOUS)
TROCAR ADV FIXATION 5X100MM (TROCAR) ×7 IMPLANT
TROCAR PORT AIRSEAL 5X120 (TROCAR) ×7 IMPLANT
TROCAR XCEL NON BLADE 8MM B8LT (ENDOMECHANICALS) ×7 IMPLANT
TROCAR XCEL NON-BLD 5MMX100MML (ENDOMECHANICALS) ×7 IMPLANT
TUBING CONNECTING 10 (TUBING) ×2 IMPLANT
TUBING CONNECTING 10' (TUBING)
WARMER LAPAROSCOPE (MISCELLANEOUS) ×7 IMPLANT
YANKAUER SUCT BULB TIP NO VENT (SUCTIONS) ×3 IMPLANT

## 2017-02-22 NOTE — Interval H&P Note (Signed)
History and Physical Interval Note:  02/22/2017 7:11 AM  Michelle Castillo  has presented today for surgery, with the diagnosis of symptomatic uterine fibroids, AUB, anemia right bartholin cyst  The various methods of treatment have been discussed with the patient and family. After consideration of risks, benefits and other options for treatment, the patient has consented to  Procedure(s) with comments: HYSTERECTOMY TOTAL LAPAROSCOPIC WITH SALPINGECTOMY (Bilateral) CYSTOSCOPY  possible (N/A) - possible cysto BARTHOLIN CYST MARSUPIALIZATION (N/A) as a surgical intervention .  The patient's history has been reviewed, patient examined, no change in status, stable for surgery.  I have reviewed the patient's chart and labs.  Questions were answered to the patient's satisfaction.    Patient started her menses a few days ago. Will check a CBC, will not delay surgery, would like to know her starting hgb.   Salvadore Dom

## 2017-02-22 NOTE — Progress Notes (Signed)
Patient ambulating in the hallway without difficulty. Merdis Snodgrass L Lesly Pontarelli, RN  

## 2017-02-22 NOTE — Transfer of Care (Signed)
Immediate Anesthesia Transfer of Care Note  Patient: Michelle Castillo  Procedure(s) Performed: Procedure(s) with comments: HYSTERECTOMY TOTAL LAPAROSCOPIC WITH SALPINGECTOMY (Bilateral) CYSTOSCOPY (N/A) BARTHOLIN CYST MARSUPIALIZATION (Right) EXTENSIVE LAPAROSCOPIC LYSIS OF ADHESIONS (N/A) - Dr. Kieth Brightly  OOPHORECTOMY (Left)  Patient Location: PACU  Anesthesia Type:General  Level of Consciousness: awake, alert  and oriented  Airway & Oxygen Therapy: Patient Spontanous Breathing and Patient connected to nasal cannula oxygen  Post-op Assessment: Report given to RN, Post -op Vital signs reviewed and stable and Patient moving all extremities  Post vital signs: Reviewed and stable  Last Vitals:  Vitals:   02/22/17 0635  BP: (!) 162/89  Pulse: 89  Resp: 16  Temp: 36.6 C    Last Pain:  Vitals:   02/22/17 0635  TempSrc: Oral  PainSc: 5       Patients Stated Pain Goal: 4 (97/28/20 6015)  Complications: No apparent anesthesia complications

## 2017-02-22 NOTE — Anesthesia Postprocedure Evaluation (Signed)
Anesthesia Post Note  Patient: Michelle Castillo  Procedure(s) Performed: Procedure(s) (LRB): HYSTERECTOMY TOTAL LAPAROSCOPIC WITH SALPINGECTOMY (Bilateral) CYSTOSCOPY (N/A) BARTHOLIN CYST MARSUPIALIZATION (Right) EXTENSIVE LAPAROSCOPIC LYSIS OF ADHESIONS (N/A) OOPHORECTOMY (Left)     Patient location during evaluation: PACU Anesthesia Type: General Level of consciousness: awake and alert Pain management: pain level controlled Vital Signs Assessment: post-procedure vital signs reviewed and stable Respiratory status: spontaneous breathing, nonlabored ventilation and respiratory function stable Cardiovascular status: blood pressure returned to baseline and stable Postop Assessment: no signs of nausea or vomiting Anesthetic complications: no    Last Vitals:  Vitals:   02/22/17 1415 02/22/17 1430  BP: 138/83 140/82  Pulse: 93 92  Resp: 10 13  Temp:      Last Pain:  Vitals:   02/22/17 1430  TempSrc:   PainSc: 3    Pain Goal: Patients Stated Pain Goal: 4 (02/22/17 0635)               Lynda Rainwater

## 2017-02-22 NOTE — Anesthesia Procedure Notes (Signed)
Procedure Name: Intubation Date/Time: 02/22/2017 7:39 AM Performed by: Hewitt Blade Pre-anesthesia Checklist: Patient identified, Emergency Drugs available, Suction available and Patient being monitored Patient Re-evaluated:Patient Re-evaluated prior to inductionOxygen Delivery Method: Circle system utilized Preoxygenation: Pre-oxygenation with 100% oxygen Intubation Type: IV induction Ventilation: Mask ventilation without difficulty Laryngoscope Size: Mac and 3 Grade View: Grade I Tube type: Oral Tube size: 7.0 mm Number of attempts: 1 Airway Equipment and Method: Stylet Placement Confirmation: ETT inserted through vocal cords under direct vision,  positive ETCO2 and breath sounds checked- equal and bilateral Secured at: 21 cm Tube secured with: Tape Dental Injury: Teeth and Oropharynx as per pre-operative assessment

## 2017-02-22 NOTE — Op Note (Signed)
Preoperative diagnosis: menorrhagia, adhesive disease  Postoperative diagnosis: same   Procedure: laparoscopic lysis of adhesions  Surgeon: Gurney Maxin, M.D.  Co-Surgeon: Fuller Song  Anesthesia: general  Indications for procedure: Michelle Castillo is a 51 y.o. year old female with symptoms of menorrhagia was undergoing laparoscopic hysterectomy and called for general surgery intraoperative consult due to adhesive disease.  Description of procedure: The patient was in lithotomy and trendelenburg. There was adhesive disease of the anterior peritoneal reflection of the rectum up to the posterior uterus. These bands were first taken down with harmonic scalpel. Then further blunt dissection was used to free the colon away from the uterus.  After this, Dr. Talbert Nan proceeding with further dissection of the uterus. Once the rest of the uterus was completely free from the surrounding area, the colon and rectum were inspected and were intact without injury.  Please see Dr. Gentry Fitz dictation for the remainder of the case.  Findings: adhesions from the posterior uterus to the anterior rectum  Complications: none  Gurney Maxin, M.D. General, Bariatric, & Minimally Invasive [facility]

## 2017-02-22 NOTE — Op Note (Signed)
Preoperative Diagnosis: Fibroid uterus, menorrhagia, anemia, right bartholin's cyst  Postoperative Diagnosis: Fibroid uterus, menorrhagia, anemia, severe pelvic adhesions, stage 4 endometriosis, right bartholin's cyst  Procedure:  Total Laparoscopic Hysterectomy with right salpingectomy, left salpingo oophorectomy, extensive lysis of adhesions, marsupialization of right bartholin's gland and cystoscopy  Surgeon: Dr [name] Boast  Assistant: Dr Edwinna Areola  Intraoperative consultation: Dr Gurney Maxin  Anesthesia: General  EBL: 200 cc  Fluids: 2500 cc LR  Urine output: 550 cc  Intrauterine weight: 024.0 grams  Complications: none  Indications for surgery: The patient is a 51 year old female, who presented with a symptomatic fibroid uterus and anemia. Work up included an ultrasound which revealed multiple fibroids and a benign endometrial biopsy.   The options of treatment were reviewed with the patient and she desired hysterectomy.  The patient is aware of the risks and complications involved with the surgery and consent was obtained prior to the procedure.  Findings: EUA: enlarged fibroid uterus. 2 cm right bartholin's cyst. Laparoscopy: enlarged fibroid uterus, stage 4 endometriosis. Both adnexa were adherent to the back of the uterus, the pelvic side wall and the colon, the left ureter was noted to be running just behind the left adnexa.   Procedure: The patient was taken to the operating room with an IV in placed, preoperative antibiotics had been administered. She was placed in the dorsal lithotomy position. General anesthesia was administered. She was prepped and draped in the usual sterile fashion for an abdominal, vaginal surgery. A rumi uterine manipulator was placed, using a #3 cup and a 8 cm extender. A foley catheter was placed.    Given the size of the uterus and a small umbilical hernia the decision was made to proceed with a left upper quadrant entry. Local was  injected approximately 2-3 cm below the rib cage on the left in the midclavicular line. The area was incised with a #11 blade and a 5 mm trocar was inserted under direct visualization with the laparoscope. The abdominal cavity was insufflated with CO2, with normal intraabdominal pressures. The patient was placed in trendelenburg and the abdominal pelvic cavity was inspected. 3 more trocars were placed: 1 in each lower quadrant approximately 3 cm medial to and superior to the anterior superior iliac spine and one in the midline approximately 6 cm above the pubic symphysis in the midline. These areas were injected with 0.25% marcaine, incised with a #11 blade and all trocars were inserted with direct visualization with the laparoscope. A # 5 airseal trocar was placed in the RLQ, a 5 mm trocar in the LLQ and a #8 trocar in the midline. The abdominal pelvic cavity was again inspected and the extensive adhesive disease was noted.   Mobilization with the uterine manipulator was limited, so the rumi was removed and the #3.5 advincular manipulator was used.  The adhesions in the posterior cul de sac were partially dissected using sharp dissection, blunt dissection and use of the harmonic scalpel. Small leakage of endometriomas occurred with the blunt dissection. The right tube which was dilated was dissected off of the uterus and ovary using the harmonic scalpel. The colon was up against the posterior uterus and attached to bilateral adnexa. This was partially taken down, then a surgery consult was obtained to assist with dissection of the colon away from the operative site so hysterectomy could safely be performed. Please see Dr Amie Portland report.   The left round ligament was cauterized and cut with the ligasure device. The anterior leaf  of the broad ligament was taken down with the harmonic scalpel, the bladder flap was developed. The posterior leaf of the broad ligament was opened with the harmonic scalpel to aid  in identifying the ureter and separating it from the specimen. The ureter was carefully separated from the specimen. The left ovary was so adherent to the posterior uterus and pelvic side wall the decision was made to remove it. After the ureter was clearly away from the specimen the left infundibulopelvic ligament was cauterized and cut with the ligasure device. The ovary was removed from the pelvic side wall taking care to not leave any ovary behind. Once the left adnexa was free from the side wall the left uterine vessels were skeletonized, then cauterized and cut with the ligasure device.   Attention was then turned to the right. The right ovary was adherent to the pelvic side wall, but was no longer adherent to the posterior uterus. The ureter was identified and noted to be below the operative field. The right infundibulopelvic ligament was cauterized and cut with the ligasure device. The round ligament on the right was cauterized and cut with the ligasure device and anterior and posterior leaves of the broad ligament were developed. The bladder flap was further dissected. The uterine vessels were skeletonized, then cauterized and cut with the ligasure device.   Using the rumi manipulator the uterus was pushed up in the pelvic cavity and the harmonic scalpel was used to separate the cervix from the vagina using the harmonic energy. The uterine manipulator was removed and an alexis bag was placed through the vagina into the peritoneal cavity. The uterus was placed in the bag and the ends of the bag were removed vaginally. The uterus was morcellated within the alexis bag and removed vaginally in many pieces. The bag was then removed and noted to be intact.  An occluder was placed in the vagina to maintain pneumoperitoneum.   The right bartholin's cyst was injected with local, incised with a #11 blade and sewn open with multiple stitches of 4-0 vicryl suture.   The vaginal cuff was then closed with a 0  V-lock suture. Hemostasis was excellent. The abdominal pelvic cavity was irrigated and suctioned dry. Pressure was released and hemostasis remained excellent.    The abdominal cavity was desufflated and the trocars were removed. The skin was closed with subcuticular stiches of 4-0 vicryl and dermabond was placed over the incisions.  The foley catheter was removed and cystoscopy was performed using a 70 degree scope. Both ureters expelled urine, no bladder abnormalities were noted. The bladder was allowed to drain and the cystoscope was removed.   The patient's abdomen and perineum were cleansed and she was taken out of the dorsal lithotomy position. Upon awakening she was extubated and taken to the recovery room in stable condition. The sponge and instrument counts were correct.   The severity of the adhesions made this case very difficult. Over 5 hours were spent in the OR.  Of note the patient did receive a second dose of Cefotetan in the OR, 6 hours after her first dose.

## 2017-02-22 NOTE — Progress Notes (Signed)
Pt OOB and ambulated to the bathroom. Pt voided 400 cc clear, yellow urine. Toya Smothers, RN

## 2017-02-22 NOTE — Progress Notes (Signed)
Day of Surgery Procedure(s) (LRB): HYSTERECTOMY TOTAL LAPAROSCOPIC WITH SALPINGECTOMY (Bilateral) CYSTOSCOPY (N/A) BARTHOLIN CYST MARSUPIALIZATION (Right) EXTENSIVE LAPAROSCOPIC LYSIS OF ADHESIONS (N/A) OOPHORECTOMY (Left)  Subjective: Patient reports some pain around her left upper quadrant incision. Overall pain is well controlled. She hasn't gotten out of bed yet, hasn't voided. Drinking, not hungry yet.  Objective: I have reviewed patient's vital signs and intake and output. Today's Vitals   02/22/17 1516 02/22/17 1530 02/22/17 1615 02/22/17 1725  BP: (!) 142/75  140/75   Pulse: 91  81   Resp: 16  18   Temp: 97.9 F (36.6 C)  98.1 F (36.7 C)   TempSrc:   Oral   SpO2: 100%  100%   Weight: 164 lb 4 oz (74.5 kg)     Height: 5\' 6"  (1.676 m)     PainSc: 3  3   5      Intake/Output Summary (Last 24 hours) at 02/22/17 1829 Last data filed at 02/22/17 1459  Gross per 24 hour  Intake             2900 ml  Output              750 ml  Net             2150 ml     General: alert, cooperative and no distress Resp: clear to auscultation bilaterally Cardio: S1, S2 normal GI: soft, appropriately tender, hypoactive bowel sounds, mildly distended.  Extremities: extremities normal, atraumatic, no cyanosis or edema  Assessment: s/p Procedure(s) with comments: HYSTERECTOMY TOTAL LAPAROSCOPIC WITH SALPINGECTOMY (Bilateral) CYSTOSCOPY (N/A) BARTHOLIN CYST MARSUPIALIZATION (Right) EXTENSIVE LAPAROSCOPIC LYSIS OF ADHESIONS (N/A) - Dr. Kieth Brightly  OOPHORECTOMY (Left):   stable and progressing well  Plan: Advance diet Encourage ambulation will get the patient up to void, if not able to void will check a bladder scan.  Will straight cath if needed  LOS: 0 days    Salvadore Dom 02/22/2017, 6:25 PM

## 2017-02-23 ENCOUNTER — Encounter (HOSPITAL_COMMUNITY): Payer: Self-pay | Admitting: Obstetrics and Gynecology

## 2017-02-23 DIAGNOSIS — D251 Intramural leiomyoma of uterus: Secondary | ICD-10-CM | POA: Diagnosis not present

## 2017-02-23 LAB — COMPREHENSIVE METABOLIC PANEL
ALBUMIN: 3.2 g/dL — AB (ref 3.5–5.0)
ALK PHOS: 32 U/L — AB (ref 38–126)
ALT: 11 U/L — AB (ref 14–54)
AST: 21 U/L (ref 15–41)
Anion gap: 7 (ref 5–15)
BILIRUBIN TOTAL: 0.5 mg/dL (ref 0.3–1.2)
BUN: 11 mg/dL (ref 6–20)
CALCIUM: 8.3 mg/dL — AB (ref 8.9–10.3)
CO2: 25 mmol/L (ref 22–32)
Chloride: 105 mmol/L (ref 101–111)
Creatinine, Ser: 0.85 mg/dL (ref 0.44–1.00)
GFR calc Af Amer: >60 mL/min (ref >60)
GFR calc non Af Amer: >60 mL/min (ref >60)
GLUCOSE: 110 mg/dL — AB (ref 65–99)
Potassium: 3.2 mmol/L — ABNORMAL LOW (ref 3.5–5.1)
SODIUM: 137 mmol/L (ref 135–145)
TOTAL PROTEIN: 6.2 g/dL — AB (ref 6.5–8.1)

## 2017-02-23 LAB — CBC
HEMATOCRIT: 30.7 % — AB (ref 36.0–46.0)
HEMOGLOBIN: 10 g/dL — AB (ref 12.0–15.0)
MCH: 25.9 pg — ABNORMAL LOW (ref 26.0–34.0)
MCHC: 32.6 g/dL (ref 30.0–36.0)
MCV: 79.5 fL (ref 78.0–100.0)
Platelets: 325 10*3/uL (ref 150–400)
RBC: 3.86 MIL/uL — AB (ref 3.87–5.11)
RDW: 14.7 % (ref 11.5–15.5)
WBC: 9.6 10*3/uL (ref 4.0–10.5)

## 2017-02-23 LAB — GLUCOSE, CAPILLARY: Glucose-Capillary: 161 mg/dL — ABNORMAL HIGH (ref 65–99)

## 2017-02-23 MED ORDER — IBUPROFEN 800 MG PO TABS: 800.0000 mg | ORAL_TABLET | Freq: Three times a day (TID) | ORAL | 0 refills | Status: DC | PRN

## 2017-02-23 MED ORDER — DOCUSATE SODIUM 100 MG PO CAPS: 100.0000 mg | ORAL_CAPSULE | Freq: Two times a day (BID) | ORAL | 0 refills | Status: DC

## 2017-02-23 MED ORDER — POTASSIUM CHLORIDE CRYS ER 20 MEQ PO TBCR: 20.0000 meq | EXTENDED_RELEASE_TABLET | Freq: Every day | ORAL | 0 refills | Status: DC

## 2017-02-23 MED ORDER — OXYCODONE-ACETAMINOPHEN 5-325 MG PO TABS: 1.0000 | ORAL_TABLET | ORAL | 0 refills | Status: DC | PRN

## 2017-02-23 NOTE — Progress Notes (Signed)
1 Day Post-Op Procedure(s) (LRB): HYSTERECTOMY TOTAL LAPAROSCOPIC WITH SALPINGECTOMY (Bilateral) CYSTOSCOPY (N/A) BARTHOLIN CYST MARSUPIALIZATION (Right) EXTENSIVE LAPAROSCOPIC LYSIS OF ADHESIONS (N/A) OOPHORECTOMY (Left)  Subjective: Patient reports pain is well controlled. Ambulating, tolerating po, voiding well, no flatus.    Objective: I have reviewed patient's vital signs, intake and output and labs.  Today's Vitals   02/23/17 0400 02/23/17 0430 02/23/17 0446 02/23/17 0600  BP:  121/67    Pulse:  85    Resp:  17    Temp:  98.7 F (37.1 C)    TempSrc:  Oral    SpO2:  100%    Weight:      Height:      PainSc: Asleep  5  3     I/O last 3 completed shifts: In: 2900 [I.V.:2900] Out: 1900 [Urine:1700; Blood:200] No intake/output data recorded.   CBC Latest Ref Rng & Units 02/23/2017 02/22/2017 02/10/2017  WBC 4.0 - 10.5 K/uL 9.6 5.5 7.0  Hemoglobin 12.0 - 15.0 g/dL 10.0(L) 12.0 11.9(L)  Hematocrit 36.0 - 46.0 % 30.7(L) 36.8 37.2  Platelets 150 - 400 K/uL 325 354 329   CMP Latest Ref Rng & Units 02/23/2017 02/10/2017 02/01/2017  Glucose 65 - 99 mg/dL 110(H) 93 161(H)  BUN 6 - 20 mg/dL 11 9 12.3  Creatinine 0.44 - 1.00 mg/dL 0.85 0.92 1.0  Sodium 135 - 145 mmol/L 137 135 136  Potassium 3.5 - 5.1 mmol/L 3.2(L) 3.4(L) 3.5  Chloride 101 - 111 mmol/L 105 105 -  CO2 22 - 32 mmol/L 25 27 24   Calcium 8.9 - 10.3 mg/dL 8.3(L) 9.5 9.7  Total Protein 6.5 - 8.1 g/dL 6.2(L) 7.7 7.4  Total Bilirubin 0.3 - 1.2 mg/dL 0.5 0.2(L) 0.27  Alkaline Phos 38 - 126 U/L 32(L) 42 49  AST 15 - 41 U/L 21 15 14   ALT 14 - 54 U/L 11(L) 15 10     General: alert, cooperative and no distress Resp: clear to auscultation bilaterally Cardio: S1, S2 normal GI: soft, appropriately tender, mildly distended, NABS. Incisions are clean, dry and intact without erythema Extremities: extremities normal, atraumatic, no cyanosis or edema     Assessment: s/p Procedure(s) with comments: HYSTERECTOMY TOTAL  LAPAROSCOPIC WITH SALPINGECTOMY (Bilateral) CYSTOSCOPY (N/A) BARTHOLIN CYST MARSUPIALIZATION (Right) EXTENSIVE LAPAROSCOPIC LYSIS OF ADHESIONS (N/A) - Dr. Kieth Brightly  OOPHORECTOMY (Left): stable, progressing well and tolerating diet  Mild hypokalemia (on a diuretic)  Plan: Discharge home  Will send home on oral potassium, f/u with primary  LOS: 0 days    Salvadore Dom 02/23/2017, 7:42 AM  CC: Renato Shin

## 2017-02-23 NOTE — Progress Notes (Signed)
Pt discharged with printed instructions. Pt verbalized an understanding. No concerns noted. Taksh Hjort L Jazari Ober, RN 

## 2017-03-01 ENCOUNTER — Encounter: Payer: Self-pay | Admitting: Obstetrics and Gynecology

## 2017-03-01 ENCOUNTER — Ambulatory Visit (INDEPENDENT_AMBULATORY_CARE_PROVIDER_SITE_OTHER): Payer: 59 | Admitting: Obstetrics and Gynecology

## 2017-03-01 VITALS — BP 154/78 | HR 84 | Resp 14 | Wt 162.0 lb

## 2017-03-01 DIAGNOSIS — N809 Endometriosis, unspecified: Secondary | ICD-10-CM

## 2017-03-01 DIAGNOSIS — Z90710 Acquired absence of both cervix and uterus: Secondary | ICD-10-CM

## 2017-03-01 NOTE — Progress Notes (Signed)
GYNECOLOGY  VISIT   HPI: 51 y.o.   Married  [place] American  female   [phone] with Patient's last menstrual period was 12/29/2016.   here for follow up. Patient is 7 days s/p TLH/RS/LSO/LOA, marsupialization of right bartholin's and cystoscopy. She is doing okay, some discomfort with moving around and the left upper quadrant incision. Having BM's and voiding normally. She is taking the ibuprofen, not taking the percocet.    She wants to start working part time from home. She is asking about exercise and sexual activity.    GYNECOLOGIC HISTORY: Patient's last menstrual period was 12/29/2016. Contraception:hysterectomy  Menopausal hormone therapy: none        OB History    Gravida Para Term Preterm AB Living   2 1     1 1    SAB TAB Ectopic Multiple Live Births     1     1         Patient Active Problem List   Diagnosis Date Noted  . Status post laparoscopic hysterectomy 02/22/2017  . Hypokalemia 02/14/2017  . Pain in joint, lower leg 03/15/2013  . Encounter for long-term (current) use of other medications 06/23/2012  . Routine general medical examination at a health care facility 06/23/2012  . Screening examination for infectious disease 06/23/2012  . HYPERCHOLESTEROLEMIA 07/30/2010  . Iron deficiency anemia 07/30/2010  . HEADACHE 03/19/2008  . Hyperthyroidism 07/10/2007  . MYALGIA 07/10/2007  . Diabetes ([place]) 04/06/2007  . HYPERTENSION 04/06/2007  . URINARY INCONTINENCE 04/06/2007    Past Medical History:  Diagnosis Date  . Abnormal Pap smear of cervix 10-02-07   ASCUS  . ANEMIA, IRON DEFICIENCY   . DIABETES MELLITUS, TYPE II   . Fibroid   . Headache(784.0)   . HYPERCHOLESTEROLEMIA   . HYPERTENSION   . Hyperthyroidism    no meds, slightly elevated, MD will recheck in 3 months  . Migraines   . Unspecified disorder of thyroid   . URINARY INCONTINENCE     Past Surgical History:  Procedure Laterality Date  . APPENDECTOMY    . BARTHOLIN CYST MARSUPIALIZATION Right  02/22/2017   Procedure: BARTHOLIN CYST MARSUPIALIZATION;  Surgeon: Salvadore Dom, MD;  Location: [facility];  Service: Gynecology;  Laterality: Right;  . CESAREAN SECTION  1998  . CYSTOSCOPY N/A 02/22/2017   Procedure: CYSTOSCOPY;  Surgeon: Salvadore Dom, MD;  Location: [facility];  Service: Gynecology;  Laterality: N/A;  . LAPAROSCOPIC LYSIS OF ADHESIONS N/A 02/22/2017   Procedure: EXTENSIVE LAPAROSCOPIC LYSIS OF ADHESIONS;  Surgeon: Salvadore Dom, MD;  Location: [facility];  Service: Gynecology;  Laterality: N/A;  Dr. Kieth Brightly   . OOPHORECTOMY Left 02/22/2017   Procedure: OOPHORECTOMY;  Surgeon: Salvadore Dom, MD;  Location: [facility];  Service: Gynecology;  Laterality: Left;  . TONSILLECTOMY AND ADENOIDECTOMY    . TOTAL LAPAROSCOPIC HYSTERECTOMY WITH SALPINGECTOMY Bilateral 02/22/2017   Procedure: HYSTERECTOMY TOTAL LAPAROSCOPIC WITH SALPINGECTOMY;  Surgeon: Salvadore Dom, MD;  Location: [facility];  Service: Gynecology;  Laterality: Bilateral;    Current Outpatient Prescriptions  Medication Sig Dispense Refill  . docusate sodium (COLACE) 100 MG capsule Take 1 capsule (100 mg total) by mouth 2 (two) times daily. 10 capsule 0  . glucose blood (ONETOUCH VERIO) test strip Use to check blood sugar 1 time per day. (Patient taking differently: 1 each by Other route daily. Use to check blood sugar 1 time per day.) 100 each 2  . hydrochlorothiazide (HYDRODIURIL) 25 MG tablet Take 1 tablet (25  mg total) by mouth daily. for high blood pressure 30 tablet 11  . ibuprofen (ADVIL,MOTRIN) 800 MG tablet Take 1 tablet (800 mg total) by mouth every 8 (eight) hours as needed. 30 tablet 0  . metFORMIN (GLUCOPHAGE-XR) 500 MG 24 hr tablet TAKE (2) TABLETS TWICE DAILY. (Patient taking differently: Take 500 mg by mouth daily with breakfast. ) 120 tablet 11  . oxybutynin (DITROPAN) 5 MG tablet Take 1 tablet (5 mg total) by mouth 2 (two) times daily. (Patient taking differently: Take 5 mg by mouth 2 (two)  times daily as needed (for urinary frequency). ) 60 tablet 11  . oxyCODONE-acetaminophen (PERCOCET/ROXICET) 5-325 MG tablet Take 1-2 tablets by mouth every 4 (four) hours as needed (moderate to severe pain (when tolerating fluids)). 30 tablet 0  . potassium chloride SA (K-DUR,KLOR-CON) 20 MEQ tablet Take 1 tablet (20 mEq total) by mouth daily. 30 tablet 0  . SUMAtriptan (IMITREX) 100 MG tablet TAKE 1 TABLET AT ONSET-MAY REPEAT IN 2 HOURS AS NEEDED- NO MORE THAN 2 IN 24 HOURS. (Patient taking differently: Take 100 mg by mouth See admin instructions. TAKE 1 TABLET AT ONSET-MAY REPEAT IN 2 HOURS AS NEEDED- NO MORE THAN 2 IN 24 HOURS.) 30 tablet 2   No current facility-administered medications for this visit.      ALLERGIES: Patient has no known allergies.  Family History  Problem Relation Age of Onset  . Diabetes Father   . Colon cancer Father 58       2002  . Hypertension Father   . Stroke Father   . Heart disease Father        stints  . Diabetes Mother   . Thyroid disease Mother   . Hypertension Mother   . Breast cancer Maternal Aunt   . Breast cancer Maternal Aunt   . Breast cancer Maternal Aunt   . Breast cancer Maternal Aunt   . Breast cancer Maternal Aunt     Social History   Social History  . Marital status: Married    Spouse name: N/A  . Number of children: 1  . Years of education: N/A   Occupational History  . [employer]   Social History Main Topics  . Smoking status: Never Smoker  . Smokeless tobacco: Never Used  . Alcohol use No  . Drug use: No  . Sexual activity: Yes    Partners: Male    Birth control/ protection: Surgical   Other Topics Concern  . Not on file   Social History Narrative   Occupation: Works Orthoptist   Regular exercise-yes          Review of Systems  Constitutional: Negative.   HENT: Negative.   Eyes: Negative.   Respiratory: Negative.   Cardiovascular: Negative.   Gastrointestinal: Negative.    Genitourinary: Negative.   Musculoskeletal: Negative.   Skin: Negative.   Neurological: Negative.   Endo/Heme/Allergies: Negative.   Psychiatric/Behavioral: Negative.     PHYSICAL EXAMINATION:    BP (!) 154/78 (BP Location: Right Arm, Patient Position: Sitting, Cuff Size: Normal)   Pulse 84   Resp 14   Wt 162 lb (73.5 kg)   LMP 12/29/2016   BMI 26.15 kg/m     General appearance: alert, cooperative and appears stated age Abdomen: soft, minimally tender around her incisions, no rebound, no guardingl; no masses,  no organomegaly Incisions: clean, dry and intact without erythema. Minimal induration around the left upper quadrant incision.  ASSESSMENT 1 week s/p TLH/LSO/RS/LOA/marsupulization of bartholin's and cystoscopy. Doing well    PLAN She will start working from home 4 hours a day on Thursday. She wants to start working full time from home next week if she is able. If she is tolerating the half days she will call  Discussed gradually increasing her activity Discussed avoiding intercourse x 8-12 weeks, discussed risk of cuff dehiscence    An After Visit Summary was printed and given to the patient.

## 2017-03-03 ENCOUNTER — Telehealth: Payer: Self-pay | Admitting: Obstetrics and Gynecology

## 2017-03-03 NOTE — Telephone Encounter (Signed)
Patient would like to change her return to work to date.

## 2017-03-03 NOTE — Telephone Encounter (Signed)
Left detailed message at number provided [phone], okay per ROI. Advised letter has been written and is ready for pick up. Copy sent via MyChart. Will need to pick up original with signature to take to work. Letter to the front. Advised to return call with any further questions.  CC: Dr.Jertson  Routing to provider for final review. Patient agreeable to disposition. Will close encounter.

## 2017-03-03 NOTE — Telephone Encounter (Signed)
Ok to start back at work 1/2 days starting 03/07/17.  If she is not able to do this, have her contact the office back.   Cc - Dr. Talbert Nan.

## 2017-03-03 NOTE — Telephone Encounter (Signed)
Spoke with patient. Patient was seen on 03/01/2017 and discussed returning to work early starting today with half days and then returning full time next week. Patient states she is still feeling very fatigued and does not feel she can return to work today with half days. Asking for a new note to start back to work with half days starting 03/07/2017. Advised will review with MD and return call. Patient is agreeable.  Routing to Dr.Silva as Dr.Jertson is out of the office today.

## 2017-03-06 ENCOUNTER — Inpatient Hospital Stay (HOSPITAL_COMMUNITY)
Admission: AD | Admit: 2017-03-06 | Discharge: 2017-03-08 | DRG: 810 | Disposition: A | Discharge status: Skilled Nursing Facility | Payer: 59 | Source: Ambulatory Visit | Attending: Internal Medicine | Admitting: Internal Medicine

## 2017-03-06 DIAGNOSIS — D72829 Elevated white blood cell count, unspecified: Secondary | ICD-10-CM | POA: Diagnosis not present

## 2017-03-06 DIAGNOSIS — R5383 Other fatigue: Secondary | ICD-10-CM | POA: Diagnosis not present

## 2017-03-06 DIAGNOSIS — D649 Anemia, unspecified: Secondary | ICD-10-CM | POA: Diagnosis present

## 2017-03-06 DIAGNOSIS — I10 Essential (primary) hypertension: Secondary | ICD-10-CM | POA: Diagnosis present

## 2017-03-06 DIAGNOSIS — Z90710 Acquired absence of both cervix and uterus: Secondary | ICD-10-CM

## 2017-03-06 DIAGNOSIS — D61818 Other pancytopenia: Secondary | ICD-10-CM

## 2017-03-06 DIAGNOSIS — E0590 Thyrotoxicosis, unspecified without thyrotoxic crisis or storm: Secondary | ICD-10-CM | POA: Diagnosis present

## 2017-03-06 DIAGNOSIS — E785 Hyperlipidemia, unspecified: Secondary | ICD-10-CM | POA: Diagnosis present

## 2017-03-06 DIAGNOSIS — D7389 Other diseases of spleen: Secondary | ICD-10-CM | POA: Diagnosis present

## 2017-03-06 DIAGNOSIS — D589 Hereditary hemolytic anemia, unspecified: Secondary | ICD-10-CM | POA: Diagnosis present

## 2017-03-06 DIAGNOSIS — D591 Autoimmune hemolytic anemia, unspecified: Secondary | ICD-10-CM

## 2017-03-06 DIAGNOSIS — G8918 Other acute postprocedural pain: Secondary | ICD-10-CM

## 2017-03-06 DIAGNOSIS — R188 Other ascites: Secondary | ICD-10-CM

## 2017-03-06 DIAGNOSIS — D72823 Leukemoid reaction: Secondary | ICD-10-CM

## 2017-03-06 DIAGNOSIS — E119 Type 2 diabetes mellitus without complications: Secondary | ICD-10-CM | POA: Diagnosis present

## 2017-03-06 DIAGNOSIS — D62 Acute posthemorrhagic anemia: Secondary | ICD-10-CM | POA: Diagnosis not present

## 2017-03-06 DIAGNOSIS — E7800 Pure hypercholesterolemia, unspecified: Secondary | ICD-10-CM | POA: Diagnosis present

## 2017-03-06 DIAGNOSIS — Z803 Family history of malignant neoplasm of breast: Secondary | ICD-10-CM

## 2017-03-06 LAB — COMPREHENSIVE METABOLIC PANEL
ALT: 159 U/L — AB (ref 14–54)
AST: 153 U/L — ABNORMAL HIGH (ref 15–41)
Albumin: 4.4 g/dL (ref 3.5–5.0)
Alkaline Phosphatase: 57 U/L (ref 38–126)
Anion gap: 9 (ref 5–15)
BUN: 22 mg/dL — ABNORMAL HIGH (ref 6–20)
CALCIUM: 10.1 mg/dL (ref 8.9–10.3)
CHLORIDE: 102 mmol/L (ref 101–111)
CO2: 25 mmol/L (ref 22–32)
CREATININE: 0.57 mg/dL (ref 0.44–1.00)
Glucose, Bld: 185 mg/dL — ABNORMAL HIGH (ref 65–99)
Potassium: 3.7 mmol/L (ref 3.5–5.1)
Sodium: 136 mmol/L (ref 135–145)
TOTAL PROTEIN: 7.6 g/dL (ref 6.5–8.1)
Total Bilirubin: 8 mg/dL — ABNORMAL HIGH (ref 0.3–1.2)

## 2017-03-06 LAB — URINALYSIS, COMPLETE (UACMP) WITH MICROSCOPIC
Bacteria, UA: NONE SEEN
Bilirubin Urine: NEGATIVE
GLUCOSE, UA: 150 mg/dL — AB
Ketones, ur: NEGATIVE mg/dL
Leukocytes, UA: NEGATIVE
NITRITE: NEGATIVE
PH: 5 (ref 5.0–8.0)
Protein, ur: 100 mg/dL — AB
SPECIFIC GRAVITY, URINE: 1.021 (ref 1.005–1.030)

## 2017-03-06 LAB — CBC WITH DIFFERENTIAL/PLATELET
Basophils Absolute: 0.2 10*3/uL — ABNORMAL HIGH (ref 0.0–0.1)
Basophils Relative: 1 %
Eosinophils Absolute: 0.3 10*3/uL (ref 0.0–0.7)
Eosinophils Relative: 1 %
HEMATOCRIT: 13.7 % — AB (ref 36.0–46.0)
HEMOGLOBIN: 4.7 g/dL — AB (ref 12.0–15.0)
LYMPHS ABS: 4.5 10*3/uL — AB (ref 0.7–4.0)
LYMPHS PCT: 11 %
MCH: 27.5 pg (ref 26.0–34.0)
MCHC: 34.3 g/dL (ref 30.0–36.0)
MCV: 80.1 fL (ref 78.0–100.0)
MONO ABS: 1.6 10*3/uL — AB (ref 0.1–1.0)
MONOS PCT: 4 %
NEUTROS ABS: 34.6 10*3/uL — AB (ref 1.7–7.7)
NEUTROS PCT: 84 %
Platelets: 575 10*3/uL — ABNORMAL HIGH (ref 150–400)
RBC: 1.71 MIL/uL — ABNORMAL LOW (ref 3.87–5.11)
RDW: 19.1 % — AB (ref 11.5–15.5)
WBC: 41.1 10*3/uL — ABNORMAL HIGH (ref 4.0–10.5)

## 2017-03-06 LAB — CBC
HCT: 16.1 % — ABNORMAL LOW (ref 36.0–46.0)
Hemoglobin: 5.5 g/dL — CL (ref 12.0–15.0)
MCH: 27.4 pg (ref 26.0–34.0)
MCHC: 34.2 g/dL (ref 30.0–36.0)
MCV: 80.1 fL (ref 78.0–100.0)
PLATELETS: 586 10*3/uL — AB (ref 150–400)
RBC: 2.01 MIL/uL — AB (ref 3.87–5.11)
RDW: 18.3 % — ABNORMAL HIGH (ref 11.5–15.5)
WBC: 39.6 10*3/uL — ABNORMAL HIGH (ref 4.0–10.5)

## 2017-03-06 LAB — PREPARE RBC (CROSSMATCH)

## 2017-03-06 LAB — ABO/RH: ABO/RH(D): A POS

## 2017-03-06 MED ORDER — SODIUM CHLORIDE 0.9 % IV SOLN
Freq: Once | INTRAVENOUS | Status: AC
  Administered 2017-03-06: via INTRAVENOUS

## 2017-03-06 MED ORDER — OXYCODONE-ACETAMINOPHEN 5-325 MG PO TABS: 1.0000 | ORAL_TABLET | ORAL | Status: DC | PRN

## 2017-03-06 MED ORDER — M.V.I. ADULT IV INJ
Freq: Once | INTRAVENOUS | Status: AC
  Administered 2017-03-06: 22:00:00 via INTRAVENOUS
  Filled 2017-03-06: qty 10

## 2017-03-06 MED ORDER — IBUPROFEN 600 MG PO TABS: 600.0000 mg | ORAL_TABLET | Freq: Four times a day (QID) | ORAL | Status: DC | PRN

## 2017-03-06 MED ORDER — PIPERACILLIN-TAZOBACTAM 3.375 G IVPB
3.3750 g | Freq: Three times a day (TID) | INTRAVENOUS | Status: DC
  Administered 2017-03-07 (×2): 3.375 g via INTRAVENOUS
  Filled 2017-03-06 (×2): qty 50

## 2017-03-06 MED ORDER — PRENATAL MULTIVITAMIN CH: 1.0000 | ORAL_TABLET | Freq: Every day | ORAL | Status: DC

## 2017-03-06 MED ORDER — DIPHENHYDRAMINE HCL 25 MG PO CAPS
25.0000 mg | ORAL_CAPSULE | Freq: Once | ORAL | Status: AC
  Administered 2017-03-06: 25 mg via ORAL
  Filled 2017-03-06: qty 1

## 2017-03-06 NOTE — MAU Note (Signed)
Patient had hysterectomy on 6/26, today had sudden onset of severe weakness.  Ate did not feel better.

## 2017-03-06 NOTE — MAU Note (Signed)
CRITICAL VALUE ALERT  Critical Value:  5.5 hemoglobin  Date & Time Notied:  03/06/17 2040   Provider Notified: Carlton Adam CNM  Orders Received/Actions taken: provider aware; will call attending

## 2017-03-06 NOTE — H&P (Addendum)
Michelle Castillo is an 51 y.o. female G2P1A1  RA:  Extreme fatigue, weakness POD#12 TLH/Bilateral Salpingectomy/Lt Oophorectomy/Extensive Lysis of Adhesions/Cystoscopy/Bartholin Cyst Marsupialization (Fibroids/Stage 4 Endometriosis).  HPI:  Was doing well until today.  Went walking/shopping yesterday.  Today, didn't have the strength to do anything, not even able to fix herself a meal.  Her parents came to her home and made her a soup.  No dizziness, no fainting.  No SOB, no palpitation.  No abdomino-pelvic pain.  No N/V.  No chills.  Normal mictions/BMs postop.  Pertinent Gynecological History: Blood transfusions: none Last pap: normal 06/2016 OB History: G2P1A1   Menstrual History:  Patient's last menstrual period was 12/29/2016.    Past Medical History:  Diagnosis Date  . Abnormal Pap smear of cervix 10-02-07   ASCUS  . ANEMIA, IRON DEFICIENCY   . DIABETES MELLITUS, TYPE II   . Fibroid   . Headache(784.0)   . HYPERCHOLESTEROLEMIA   . HYPERTENSION   . Hyperthyroidism    no meds, slightly elevated, MD will recheck in 3 months  . Migraines   . Unspecified disorder of thyroid   . URINARY INCONTINENCE     Past Surgical History:  Procedure Laterality Date  . ABDOMINAL HYSTERECTOMY    . APPENDECTOMY    . BARTHOLIN CYST MARSUPIALIZATION Right 02/22/2017   Procedure: BARTHOLIN CYST MARSUPIALIZATION;  Surgeon: Salvadore Dom, MD;  Location: [facility];  Service: Gynecology;  Laterality: Right;  . CESAREAN SECTION  1998  . CYSTOSCOPY N/A 02/22/2017   Procedure: CYSTOSCOPY;  Surgeon: Salvadore Dom, MD;  Location: [facility];  Service: Gynecology;  Laterality: N/A;  . LAPAROSCOPIC LYSIS OF ADHESIONS N/A 02/22/2017   Procedure: EXTENSIVE LAPAROSCOPIC LYSIS OF ADHESIONS;  Surgeon: Salvadore Dom, MD;  Location: [facility];  Service: Gynecology;  Laterality: N/A;  Dr. Kieth Brightly   . OOPHORECTOMY Left 02/22/2017   Procedure: OOPHORECTOMY;  Surgeon: Salvadore Dom, MD;  Location:  [facility];  Service: Gynecology;  Laterality: Left;  . TONSILLECTOMY AND ADENOIDECTOMY    . TOTAL LAPAROSCOPIC HYSTERECTOMY WITH SALPINGECTOMY Bilateral 02/22/2017   Procedure: HYSTERECTOMY TOTAL LAPAROSCOPIC WITH SALPINGECTOMY;  Surgeon: Salvadore Dom, MD;  Location: [facility];  Service: Gynecology;  Laterality: Bilateral;    Family History  Problem Relation Age of Onset  . Diabetes Father   . Colon cancer Father 70       2002  . Hypertension Father   . Stroke Father   . Heart disease Father        stints  . Diabetes Mother   . Thyroid disease Mother   . Hypertension Mother   . Breast cancer Maternal Aunt   . Breast cancer Maternal Aunt   . Breast cancer Maternal Aunt   . Breast cancer Maternal Aunt   . Breast cancer Maternal Aunt     Social History:  reports that she has never smoked. She has never used smokeless tobacco. She reports that she does not drink alcohol or use drugs.  Allergies: No Known Allergies  Prescriptions Prior to Admission  Medication Sig Dispense Refill Last Dose  . docusate sodium (COLACE) 100 MG capsule Take 1 capsule (100 mg total) by mouth 2 (two) times daily. 10 capsule 0 Taking  . glucose blood (ONETOUCH VERIO) test strip Use to check blood sugar 1 time per day. (Patient taking differently: 1 each by Other route daily. Use to check blood sugar 1 time per day.) 100 each 2 Taking  . hydrochlorothiazide (HYDRODIURIL) 25 MG tablet  Take 1 tablet (25 mg total) by mouth daily. for high blood pressure 30 tablet 11 Taking  . ibuprofen (ADVIL,MOTRIN) 800 MG tablet Take 1 tablet (800 mg total) by mouth every 8 (eight) hours as needed. 30 tablet 0 Taking  . metFORMIN (GLUCOPHAGE-XR) 500 MG 24 hr tablet TAKE (2) TABLETS TWICE DAILY. (Patient taking differently: Take 500 mg by mouth daily with breakfast. ) 120 tablet 11 Taking  . oxybutynin (DITROPAN) 5 MG tablet Take 1 tablet (5 mg total) by mouth 2 (two) times daily. (Patient taking differently: Take 5 mg by mouth  2 (two) times daily as needed (for urinary frequency). ) 60 tablet 11 Taking  . oxyCODONE-acetaminophen (PERCOCET/ROXICET) 5-325 MG tablet Take 1-2 tablets by mouth every 4 (four) hours as needed (moderate to severe pain (when tolerating fluids)). 30 tablet 0 Taking  . potassium chloride SA (K-DUR,KLOR-CON) 20 MEQ tablet Take 1 tablet (20 mEq total) by mouth daily. 30 tablet 0 Taking  . SUMAtriptan (IMITREX) 100 MG tablet TAKE 1 TABLET AT ONSET-MAY REPEAT IN 2 HOURS AS NEEDED- NO MORE THAN 2 IN 24 HOURS. (Patient taking differently: Take 100 mg by mouth See admin instructions. TAKE 1 TABLET AT ONSET-MAY REPEAT IN 2 HOURS AS NEEDED- NO MORE THAN 2 IN 24 HOURS.) 30 tablet 2 Taking    REVIEW OF SYSTEMS: A ROS was performed and pertinent positives and negatives are included in the history.  GENERAL: No fevers or chills. HEENT: No change in vision, no earache, sore throat or sinus congestion. NECK: No pain or stiffness. CARDIOVASCULAR: No chest pain or pressure. No palpitations. PULMONARY: No shortness of breath, cough or wheeze. GASTROINTESTINAL: No abdominal pain, nausea, vomiting or diarrhea, melena or bright red blood per rectum. GENITOURINARY: No urinary frequency, urgency, hesitancy or dysuria. MUSCULOSKELETAL: No joint or muscle pain, no back pain, no recent trauma. DERMATOLOGIC: No rash, no itching, no lesions. ENDOCRINE: No polyuria, polydipsia, no heat or cold intolerance. No recent change in weight. HEMATOLOGICAL: No anemia or easy bruising or bleeding. NEUROLOGIC: No headache, seizures, numbness, tingling or weakness. PSYCHIATRIC: No depression, no loss of interest in normal activity or change in sleep pattern.     Blood pressure 132/74, pulse (!) 112, temperature 97.8 F (36.6 C), resp. rate 20, last menstrual period 12/29/2016, SpO2 100 %.  Physical Exam:  HEENT:unremarkable Lungs:  Clear to auscultation no rhonchi's or wheezes Heart:Regular rate and rhythm, no murmurs or gallops Abdomen:   Soft, no distention, NT, no rebound.  BS normal, no high pitch. Vagina:  No vaginal bleeding Extremities: No cords, no edema, no erythema  Results for orders placed or performed during the hospital encounter of 03/06/17 (from the past 24 hour(s))  CBC     Status: Abnormal   Collection Time: 03/06/17  8:10 PM  Result Value Ref Range   WBC 39.6 (H) 4.0 - 10.5 K/uL   RBC 2.01 (L) 3.87 - 5.11 MIL/uL   Hemoglobin 5.5 (LL) 12.0 - 15.0 g/dL   HCT 16.1 (L) 36.0 - 46.0 %   MCV 80.1 78.0 - 100.0 fL   MCH 27.4 26.0 - 34.0 pg   MCHC 34.2 30.0 - 36.0 g/dL   RDW 18.3 (H) 11.5 - 15.5 %   Platelets 586 (H) 150 - 400 K/uL  Comprehensive metabolic panel     Status: Abnormal   Collection Time: 03/06/17  8:10 PM  Result Value Ref Range   Sodium 136 135 - 145 mmol/L   Potassium 3.7 3.5 - 5.1 mmol/L  Chloride 102 101 - 111 mmol/L   CO2 25 22 - 32 mmol/L   Glucose, Bld 185 (H) 65 - 99 mg/dL   BUN 22 (H) 6 - 20 mg/dL   Creatinine, Ser 0.57 0.44 - 1.00 mg/dL   Calcium 10.1 8.9 - 10.3 mg/dL   Total Protein 7.6 6.5 - 8.1 g/dL   Albumin 4.4 3.5 - 5.0 g/dL   AST 153 (H) 15 - 41 U/L   ALT 159 (H) 14 - 54 U/L   Alkaline Phosphatase 57 38 - 126 U/L   Total Bilirubin 8.0 (H) 0.3 - 1.2 mg/dL   GFR calc non Af Amer >60 >60 mL/min   GFR calc Af Amer >60 >60 mL/min   Anion gap 9 5 - 15  Type and screen [place]     Status: None (Preliminary result)   Collection Time: 03/06/17  8:10 PM  Result Value Ref Range   ABO/RH(D) A POS    Antibody Screen NEG    Sample Expiration 03/09/2017    Unit Number [id number]    Blood Component Type RED CELLS,LR    Unit division 00    Status of Unit ALLOCATED    Transfusion Status OK TO TRANSFUSE    Crossmatch Result Compatible    Unit Number [id number]    Blood Component Type RED CELLS,LR    Unit division 00    Status of Unit ISSUED    Transfusion Status OK TO TRANSFUSE    Crossmatch Result Compatible   ABO/Rh     Status: None    Collection Time: 03/06/17  8:10 PM  Result Value Ref Range   ABO/RH(D) A POS   Urinalysis, Complete w Microscopic     Status: Abnormal   Collection Time: 03/06/17  8:45 PM  Result Value Ref Range   Color, Urine AMBER (A) YELLOW   APPearance CLEAR CLEAR   Specific Gravity, Urine 1.021 1.005 - 1.030   pH 5.0 5.0 - 8.0   Glucose, UA 150 (A) NEGATIVE mg/dL   Hgb urine dipstick LARGE (A) NEGATIVE   Bilirubin Urine NEGATIVE NEGATIVE   Ketones, ur NEGATIVE NEGATIVE mg/dL   Protein, ur 100 (A) NEGATIVE mg/dL   Nitrite NEGATIVE NEGATIVE   Leukocytes, UA NEGATIVE NEGATIVE   RBC / HPF 0-5 0 - 5 RBC/hpf   WBC, UA 0-5 0 - 5 WBC/hpf   Bacteria, UA NONE SEEN NONE SEEN   Squamous Epithelial / LPF 0-5 (A) NONE SEEN   Mucous PRESENT   Prepare RBC     Status: None   Collection Time: 03/06/17  9:32 PM  Result Value Ref Range   Order Confirmation ORDER PROCESSED BY BLOOD BANK    Results for DAYLYN, CHRISTINE (MRN [id number])  Ref. Range 02/23/2017 05:45  COMPREHENSIVE METABOLIC PANEL Unknown Rpt (A)  Sodium Latest Ref Range: 135 - 145 mmol/L 137  Potassium Latest Ref Range: 3.5 - 5.1 mmol/L 3.2 (L)  Chloride Latest Ref Range: 101 - 111 mmol/L 105  CO2 Latest Ref Range: 22 - 32 mmol/L 25  Glucose Latest Ref Range: 65 - 99 mg/dL 110 (H)  BUN Latest Ref Range: 6 - 20 mg/dL 11  Creatinine Latest Ref Range: 0.44 - 1.00 mg/dL 0.85  Calcium Latest Ref Range: 8.9 - 10.3 mg/dL 8.3 (L)  Anion gap Latest Ref Range: 5 - 15  7  Alkaline Phosphatase Latest Ref Range: 38 - 126 U/L 32 (L)  Albumin Latest Ref Range: 3.5 - 5.0 g/dL 3.2 (L)  AST Latest Ref Range: 15 - 41 U/L 21  ALT Latest Ref Range: 14 - 54 U/L 11 (L)  Total Protein Latest Ref Range: 6.5 - 8.1 g/dL 6.2 (L)  Total Bilirubin Latest Ref Range: 0.3 - 1.2 mg/dL 0.5  GFR, Est African American Latest Ref Range: >60 mL/min >60  GFR, Est Non African American Latest Ref Range: >60 mL/min >60  WBC Latest Ref Range: 4.0 - 10.5 K/uL 9.6  RBC Latest Ref  Range: 3.87 - 5.11 MIL/uL 3.86 (L)  Hemoglobin Latest Ref Range: 12.0 - 15.0 g/dL 10.0 (L)  HCT Latest Ref Range: 36.0 - 46.0 % 30.7 (L)  MCV Latest Ref Range: 78.0 - 100.0 fL 79.5  MCH Latest Ref Range: 26.0 - 34.0 pg 25.9 (L)  MCHC Latest Ref Range: 30.0 - 36.0 g/dL 32.6  RDW Latest Ref Range: 11.5 - 15.5 % 14.7  Platelets Latest Ref Range: 150 - 400 K/uL 325   Assessment/Plan:  Severe Postop Anemia with Hb at 5.5 (Ht 16.1).  Was at Hb 10.0 (Ht 30.7) on POD#1 6/27th.  Extreme fatigue but otherwise asymptomatic.  Afebrile, but WBC count 39.6, diff pending.  Mild increase in liver enzymes.  Probable Postop pelvic bleeding/infection.  Will repeat CBC stat to evaluate if active bleeding as we start 2 U pRBC transfusion.  CT abdo-pelvic to investigate Pelvic Hematoma/Abscess.  Start Zosyn IV.  DIC panel, Hepatitis w-up pending. If evidence of active bleeding, will bring back to OR.  Monitoring stability closely with Telemetry.  Marie-Lyne Keionte Swicegood 03/06/2017, 11:34 PM

## 2017-03-06 NOTE — MAU Provider Note (Signed)
Patient Michelle Castillo is a 51 y.o. G2P0011 non-pregnant female here with complaints of sudden weakness today. Patient has had a little bit of bleeding, and one of her incisions is a little tender.  She has only had soup today because she was too weak to fix herself a meal.   History     CSN: [id number]  Arrival date and time: 03/06/17 1842   First Provider Initiated Contact with Patient 03/06/17 1927      Chief Complaint  Patient presents with  . Fatigue   HPI Patient states that she did a lot of walking at [facility] yesterday. Today she woke up very weak and could not walk or eat. She called Dr. Dellis Filbert, who said that she should eat and drink something with sugar in it. Her parents came to help her and they fed her soup. She is here because she is still complaining of weakness.  OB History    Gravida Para Term Preterm AB Living   2 1     1 1    SAB TAB Ectopic Multiple Live Births     1     1      Past Medical History:  Diagnosis Date  . Abnormal Pap smear of cervix 10-02-07   ASCUS  . ANEMIA, IRON DEFICIENCY   . DIABETES MELLITUS, TYPE II   . Fibroid   . Headache(784.0)   . HYPERCHOLESTEROLEMIA   . HYPERTENSION   . Hyperthyroidism    no meds, slightly elevated, MD will recheck in 3 months  . Migraines   . Unspecified disorder of thyroid   . URINARY INCONTINENCE     Past Surgical History:  Procedure Laterality Date  . ABDOMINAL HYSTERECTOMY    . APPENDECTOMY    . BARTHOLIN CYST MARSUPIALIZATION Right 02/22/2017   Procedure: BARTHOLIN CYST MARSUPIALIZATION;  Surgeon: Salvadore Dom, MD;  Location: [facility];  Service: Gynecology;  Laterality: Right;  . CESAREAN SECTION  1998  . CYSTOSCOPY N/A 02/22/2017   Procedure: CYSTOSCOPY;  Surgeon: Salvadore Dom, MD;  Location: [facility];  Service: Gynecology;  Laterality: N/A;  . LAPAROSCOPIC LYSIS OF ADHESIONS N/A 02/22/2017   Procedure: EXTENSIVE LAPAROSCOPIC LYSIS OF ADHESIONS;  Surgeon: Salvadore Dom, MD;   Location: [facility];  Service: Gynecology;  Laterality: N/A;  Dr. Kieth Brightly   . OOPHORECTOMY Left 02/22/2017   Procedure: OOPHORECTOMY;  Surgeon: Salvadore Dom, MD;  Location: [facility];  Service: Gynecology;  Laterality: Left;  . TONSILLECTOMY AND ADENOIDECTOMY    . TOTAL LAPAROSCOPIC HYSTERECTOMY WITH SALPINGECTOMY Bilateral 02/22/2017   Procedure: HYSTERECTOMY TOTAL LAPAROSCOPIC WITH SALPINGECTOMY;  Surgeon: Salvadore Dom, MD;  Location: [facility];  Service: Gynecology;  Laterality: Bilateral;    Family History  Problem Relation Age of Onset  . Diabetes Father   . Colon cancer Father 78       2002  . Hypertension Father   . Stroke Father   . Heart disease Father        stints  . Diabetes Mother   . Thyroid disease Mother   . Hypertension Mother   . Breast cancer Maternal Aunt   . Breast cancer Maternal Aunt   . Breast cancer Maternal Aunt   . Breast cancer Maternal Aunt   . Breast cancer Maternal Aunt     Social History  Substance Use Topics  . Smoking status: Never Smoker  . Smokeless tobacco: Never Used  . Alcohol use No    Allergies: No Known Allergies  Prescriptions Prior to Admission  Medication Sig Dispense Refill Last Dose  . docusate sodium (COLACE) 100 MG capsule Take 1 capsule (100 mg total) by mouth 2 (two) times daily. 10 capsule 0 Taking  . glucose blood (ONETOUCH VERIO) test strip Use to check blood sugar 1 time per day. (Patient taking differently: 1 each by Other route daily. Use to check blood sugar 1 time per day.) 100 each 2 Taking  . hydrochlorothiazide (HYDRODIURIL) 25 MG tablet Take 1 tablet (25 mg total) by mouth daily. for high blood pressure 30 tablet 11 Taking  . ibuprofen (ADVIL,MOTRIN) 800 MG tablet Take 1 tablet (800 mg total) by mouth every 8 (eight) hours as needed. 30 tablet 0 Taking  . metFORMIN (GLUCOPHAGE-XR) 500 MG 24 hr tablet TAKE (2) TABLETS TWICE DAILY. (Patient taking differently: Take 500 mg by mouth daily with breakfast. )  120 tablet 11 Taking  . oxybutynin (DITROPAN) 5 MG tablet Take 1 tablet (5 mg total) by mouth 2 (two) times daily. (Patient taking differently: Take 5 mg by mouth 2 (two) times daily as needed (for urinary frequency). ) 60 tablet 11 Taking  . oxyCODONE-acetaminophen (PERCOCET/ROXICET) 5-325 MG tablet Take 1-2 tablets by mouth every 4 (four) hours as needed (moderate to severe pain (when tolerating fluids)). 30 tablet 0 Taking  . potassium chloride SA (K-DUR,KLOR-CON) 20 MEQ tablet Take 1 tablet (20 mEq total) by mouth daily. 30 tablet 0 Taking  . SUMAtriptan (IMITREX) 100 MG tablet TAKE 1 TABLET AT ONSET-MAY REPEAT IN 2 HOURS AS NEEDED- NO MORE THAN 2 IN 24 HOURS. (Patient taking differently: Take 100 mg by mouth See admin instructions. TAKE 1 TABLET AT ONSET-MAY REPEAT IN 2 HOURS AS NEEDED- NO MORE THAN 2 IN 24 HOURS.) 30 tablet 2 Taking    Review of Systems  Respiratory: Negative.   Cardiovascular: Negative.   Gastrointestinal: Negative.   Genitourinary: Negative.   Musculoskeletal: Negative.    Physical Exam   Blood pressure (!) 124/52, pulse (!) 111, temperature 98.4 F (36.9 C), temperature source Oral, resp. rate 18, last menstrual period 12/29/2016, SpO2 100 %.  Physical Exam  Constitutional: She is oriented to person, place, and time. She appears well-developed.  HENT:  Head: Normocephalic.  Respiratory: Effort normal.  GI: Soft. She exhibits no distension and no mass. There is no tenderness. There is no rebound and no guarding.  Musculoskeletal: Normal range of motion.  Neurological: She is alert and oriented to person, place, and time.  Skin: Skin is warm and dry.  Psychiatric: She has a normal mood and affect.    MAU Course  Procedures  MDM Patient very unhappy with IV fluid with MVI recommendation with Dr. Dellis Filbert. Did not tolerate IV stick and does not have IV access. Has agreed to drink ginger ale and eat crackers.  -CBC and CMP pending -UA pending  Patient care  endorsed to [facility] at 2042 2047: Patient got up to use the rest room, and felt very dizzy. She became very anxious after this episode. She was put back into the bed. She did not lose consciousness.  D/W Dr. Dellis Filbert and reviewed labs results.  Will place in OBS on 3rd floor, 2U PRBC transfusion, CT abdomen and pelvis   Assessment and Plan  Post-operative anemia Will OBS on 3rd floor for 2U PRBCs CT scan ordered  Marcille Buffy 9:09 PM 03/06/17   Mervyn Skeeters [place] 03/06/2017, 7:40 PM

## 2017-03-06 NOTE — Progress Notes (Addendum)
Assumed care of patient after receiving report from [place], [place] Provider at bedside   2000 IV attempt x 2 without success by Theresa Mulligan, RN   2010 Lab at Bedside   2130 Pt transferred to Room 319 via stretcher. Report given to Charmian Muff, RN

## 2017-03-07 ENCOUNTER — Observation Stay (HOSPITAL_COMMUNITY): Payer: 59

## 2017-03-07 DIAGNOSIS — E119 Type 2 diabetes mellitus without complications: Secondary | ICD-10-CM

## 2017-03-07 DIAGNOSIS — D591 Autoimmune hemolytic anemia, unspecified: Secondary | ICD-10-CM

## 2017-03-07 DIAGNOSIS — D62 Acute posthemorrhagic anemia: Secondary | ICD-10-CM | POA: Diagnosis not present

## 2017-03-07 DIAGNOSIS — D72829 Elevated white blood cell count, unspecified: Secondary | ICD-10-CM | POA: Diagnosis not present

## 2017-03-07 DIAGNOSIS — D72823 Leukemoid reaction: Secondary | ICD-10-CM

## 2017-03-07 LAB — COMPREHENSIVE METABOLIC PANEL
ALBUMIN: 3.6 g/dL (ref 3.5–5.0)
ALBUMIN: 3.9 g/dL (ref 3.5–5.0)
ALK PHOS: 51 U/L (ref 38–126)
ALT: 148 U/L — AB (ref 14–54)
ALT: 162 U/L — ABNORMAL HIGH (ref 14–54)
AST: 179 U/L — ABNORMAL HIGH (ref 15–41)
AST: 142 U/L — AB (ref 15–41)
Alkaline Phosphatase: 55 U/L (ref 38–126)
Anion gap: 5 (ref 5–15)
Anion gap: 7 (ref 5–15)
BUN: 23 mg/dL — ABNORMAL HIGH (ref 6–20)
BUN: 27 mg/dL — AB (ref 6–20)
CALCIUM: 9.1 mg/dL (ref 8.9–10.3)
CHLORIDE: 105 mmol/L (ref 101–111)
CO2: 26 mmol/L (ref 22–32)
CO2: 25 mmol/L (ref 22–32)
CREATININE: 0.58 mg/dL (ref 0.44–1.00)
CREATININE: 0.61 mg/dL (ref 0.44–1.00)
Calcium: 9.1 mg/dL (ref 8.9–10.3)
Chloride: 107 mmol/L (ref 101–111)
GFR calc Af Amer: >60 mL/min (ref >60)
GFR calc Af Amer: >60 mL/min (ref >60)
GFR calc non Af Amer: >60 mL/min (ref >60)
GFR calc non Af Amer: >60 mL/min (ref >60)
GLUCOSE: 135 mg/dL — AB (ref 65–99)
GLUCOSE: 209 mg/dL — AB (ref 65–99)
POTASSIUM: 3.8 mmol/L (ref 3.5–5.1)
Potassium: 3.4 mmol/L — ABNORMAL LOW (ref 3.5–5.1)
SODIUM: 138 mmol/L (ref 135–145)
Sodium: 137 mmol/L (ref 135–145)
Total Bilirubin: 11.3 mg/dL — ABNORMAL HIGH (ref 0.3–1.2)
Total Bilirubin: 16.2 mg/dL — ABNORMAL HIGH (ref 0.3–1.2)
Total Protein: 6.5 g/dL (ref 6.5–8.1)
Total Protein: 7.2 g/dL (ref 6.5–8.1)

## 2017-03-07 LAB — BILIRUBIN, DIRECT: Bilirubin, Direct: 0.9 mg/dL — ABNORMAL HIGH (ref 0.1–0.5)

## 2017-03-07 LAB — DIRECT ANTIGLOBULIN TEST (NOT AT ARMC)
DAT, IgG: POSITIVE
DAT, complement: POSITIVE

## 2017-03-07 LAB — FERRITIN: Ferritin: 1071 ng/mL — ABNORMAL HIGH (ref 11–307)

## 2017-03-07 LAB — DIC (DISSEMINATED INTRAVASCULAR COAGULATION)PANEL
Fibrinogen: 419 mg/dL (ref 210–475)
INR: 1.23
Prothrombin Time: 15.5 seconds — ABNORMAL HIGH (ref 11.4–15.2)

## 2017-03-07 LAB — DIC (DISSEMINATED INTRAVASCULAR COAGULATION) PANEL
APTT: 38 s — AB (ref 24–36)
D DIMER QUANT: 1.81 ug{FEU}/mL — AB (ref 0.00–0.50)
PLATELETS: 575 10*3/uL — AB (ref 150–400)
SMEAR REVIEW: NONE SEEN

## 2017-03-07 LAB — IRON AND TIBC
Iron: 246 ug/dL — ABNORMAL HIGH (ref 28–170)
Saturation Ratios: 90 % — ABNORMAL HIGH (ref 10.4–31.8)
TIBC: 274 ug/dL (ref 250–450)
UIBC: 28 ug/dL

## 2017-03-07 LAB — LACTIC ACID, PLASMA: Lactic Acid, Venous: 1.4 mmol/L (ref 0.5–1.9)

## 2017-03-07 LAB — CBC
HCT: 26.6 % — ABNORMAL LOW (ref 36.0–46.0)
HEMOGLOBIN: 9.5 g/dL — AB (ref 12.0–15.0)
MCH: 30.9 pg (ref 26.0–34.0)
MCHC: 35.7 g/dL (ref 30.0–36.0)
MCV: 86.6 fL (ref 78.0–100.0)
PLATELETS: 359 10*3/uL (ref 150–400)
RBC: 3.07 MIL/uL — AB (ref 3.87–5.11)
RDW: 16.1 % — ABNORMAL HIGH (ref 11.5–15.5)
WBC: 22.4 10*3/uL — ABNORMAL HIGH (ref 4.0–10.5)

## 2017-03-07 LAB — RAPID URINE DRUG SCREEN, HOSP PERFORMED
Amphetamines: NOT DETECTED
Barbiturates: NOT DETECTED
Benzodiazepines: NOT DETECTED
Cocaine: NOT DETECTED
Opiates: NOT DETECTED
Tetrahydrocannabinol: NOT DETECTED

## 2017-03-07 LAB — RETICULOCYTES
RBC.: 2.06 MIL/uL — ABNORMAL LOW (ref 3.87–5.11)
RBC.: 3.07 MIL/uL — AB (ref 3.87–5.11)
RETIC CT PCT: 4.2 % — AB (ref 0.4–3.1)
Retic Count, Absolute: 125.7 10*3/uL (ref 19.0–186.0)
Retic Count, Absolute: 128.9 10*3/uL (ref 19.0–186.0)
Retic Ct Pct: 6.1 % — ABNORMAL HIGH (ref 0.4–3.1)

## 2017-03-07 LAB — HEPATITIS B SURFACE ANTIGEN: Hepatitis B Surface Ag: NEGATIVE

## 2017-03-07 LAB — LACTATE DEHYDROGENASE: LDH: 617 U/L — AB (ref 98–192)

## 2017-03-07 LAB — PREPARE RBC (CROSSMATCH)

## 2017-03-07 LAB — ACETAMINOPHEN LEVEL

## 2017-03-07 LAB — SAVE SMEAR

## 2017-03-07 LAB — GLUCOSE, CAPILLARY
GLUCOSE-CAPILLARY: 205 mg/dL — AB (ref 65–99)
Glucose-Capillary: 128 mg/dL — ABNORMAL HIGH (ref 65–99)
Glucose-Capillary: 88 mg/dL (ref 65–99)
Glucose-Capillary: 205 mg/dL — ABNORMAL HIGH (ref 65–99)

## 2017-03-07 MED ORDER — DIPHENHYDRAMINE HCL 25 MG PO CAPS
25.0000 mg | ORAL_CAPSULE | Freq: Once | ORAL | Status: AC
  Administered 2017-03-07: 25 mg via ORAL
  Filled 2017-03-07: qty 1

## 2017-03-07 MED ORDER — INSULIN ASPART 100 UNIT/ML ~~LOC~~ SOLN
0.0000 [IU] | SUBCUTANEOUS | Status: DC
  Administered 2017-03-07: 3 [IU] via SUBCUTANEOUS
  Administered 2017-03-07: 1 [IU] via SUBCUTANEOUS

## 2017-03-07 MED ORDER — PIPERACILLIN-TAZOBACTAM 3.375 G IVPB
3.3750 g | Freq: Three times a day (TID) | INTRAVENOUS | Status: DC
  Administered 2017-03-07 – 2017-03-08 (×4): 3.375 g via INTRAVENOUS
  Filled 2017-03-07 (×5): qty 50

## 2017-03-07 MED ORDER — IOPAMIDOL (ISOVUE-300) INJECTION 61%
100.0000 mL | Freq: Once | INTRAVENOUS | Status: AC | PRN
  Administered 2017-03-07: 100 mL via INTRAVENOUS

## 2017-03-07 MED ORDER — PIPERACILLIN-TAZOBACTAM 3.375 G IVPB
3.3750 g | Freq: Four times a day (QID) | INTRAVENOUS | Status: DC
  Filled 2017-03-07: qty 50

## 2017-03-07 MED ORDER — INSULIN ASPART 100 UNIT/ML ~~LOC~~ SOLN
0.0000 [IU] | Freq: Every day | SUBCUTANEOUS | Status: DC
  Administered 2017-03-07: 2 [IU] via SUBCUTANEOUS

## 2017-03-07 MED ORDER — METHYLPREDNISOLONE SODIUM SUCC 1000 MG IJ SOLR
1000.0000 mg | INTRAMUSCULAR | Status: AC
  Administered 2017-03-07 – 2017-03-08 (×2): 1000 mg via INTRAVENOUS
  Filled 2017-03-07 (×2): qty 8

## 2017-03-07 MED ORDER — SODIUM CHLORIDE 0.9 % IV SOLN
Freq: Once | INTRAVENOUS | Status: AC
  Administered 2017-03-07: 03:00:00 via INTRAVENOUS

## 2017-03-07 MED ORDER — ACETAMINOPHEN 325 MG PO TABS
650.0000 mg | ORAL_TABLET | Freq: Once | ORAL | Status: AC
  Administered 2017-03-07: 650 mg via ORAL
  Filled 2017-03-07: qty 2

## 2017-03-07 MED ORDER — IOPAMIDOL (ISOVUE-300) INJECTION 61%
30.0000 mL | Freq: Once | INTRAVENOUS | Status: AC | PRN
  Administered 2017-03-07: 30 mL via ORAL

## 2017-03-07 MED ORDER — INSULIN ASPART 100 UNIT/ML ~~LOC~~ SOLN
0.0000 [IU] | Freq: Three times a day (TID) | SUBCUTANEOUS | Status: DC
  Administered 2017-03-08: 3 [IU] via SUBCUTANEOUS
  Administered 2017-03-08: 2 [IU] via SUBCUTANEOUS
  Administered 2017-03-08: 3 [IU] via SUBCUTANEOUS

## 2017-03-07 MED ORDER — SODIUM CHLORIDE 0.9 % IV SOLN
Freq: Once | INTRAVENOUS | Status: AC
  Administered 2017-03-07: 14:00:00 via INTRAVENOUS

## 2017-03-07 NOTE — Progress Notes (Signed)
AM blood work collected and sent to the lab.

## 2017-03-07 NOTE — Progress Notes (Signed)
Patient refused Foley Catheter. MD was informed of same.

## 2017-03-07 NOTE — Progress Notes (Signed)
Contact with Dr. Talbert Nan. Dr.Jertson made aware of all abnormal lab results for current shift. Dr. Talbert Nan also made aware of tea colored urine. Still awaiting Hematology consult. Toya Smothers, RN

## 2017-03-07 NOTE — Progress Notes (Signed)
[facility] Progress Note Patient Name: Michelle Castillo DOB: Dec 09, 1965 MRN: [id number]   Date of Service  03/07/2017  HPI/Events of Note  S/p lap hysterectomy 6/26 Adm with severe anemia, leucocytosis, abn LFTs , Hb 10 pre-op  eICU Interventions  Lactate nml CT abdomen - favor post op findings ,doubt pelvic abscess Await rpt Hb after 2u PRBC , if no evidence of bleeding do we have to consider hemolysis here ? Check LDH & fractionate bili Zosyn empiric     Intervention Category Evaluation Type: New Patient Evaluation  ALVA,RAKESH V. 03/07/2017, 3:18 AM

## 2017-03-07 NOTE — Progress Notes (Signed)
2nd U PRBC started

## 2017-03-07 NOTE — Progress Notes (Addendum)
Subjective: Patient reports feeling weak, not light headed or dizzy. No pain, no bleeding.   Objective: I have reviewed patient's vital signs, intake and output and labs.  Today's Vitals   03/07/17 0838 03/07/17 0839 03/07/17 0840 03/07/17 0841  BP:      Pulse: (!) 103 (!) 103 (!) 102 (!) 103  Resp:      Temp:      TempSrc:      SpO2:      PainSc:       I/O last 3 completed shifts: In: 4098 [P.O.:720; Blood:700] Out: 1200 [Urine:1200] No intake/output data recorded.    General: alert, cooperative and no distress Resp: clear to auscultation bilaterally Cardio: S1, S2 normal and tachycardic GI: soft, non-tender; bowel sounds normal; no masses,  no organomegaly Extremities: extremities normal, atraumatic, no cyanosis or edema   PT 15.5, INR 1.23, aPTT 38, fib 419, DDimer 1.81, PLT 575 Lactic acid 1.4  CMP abnl results: Glucose 185 AST153 ALT 159 Tbili 8 Hemoglobinopathy evaluation pending, LFT's pending LDH 617   Lab Results  Component Value Date   WBC 41.1 (H) 03/06/2017   HGB 4.7 (LL) 03/06/2017   HCT 13.7 (L) 03/06/2017   MCV 80.1 03/06/2017   PLT 575 (H) 03/06/2017   PLT 575 (H) 03/06/2017   Assessment/Plan: POD#13 s/p TLH/LSO/RS with extensive LOA, patient presented with weakness, WBC of 40 and hgb of 4.7. CT with possible pelvic abscess.  -S/P 2 units of PRBC -Stat CBC pending -Hematology consult. I spoke with Dr Morey Hummingbird and have ordered appropriate labs -Change Zosyn to q 6 hours -Monitor labs, vitals closely (on telemetry)  -strict I/O (patient refused foley, but is voiding well, good urine output)  LOS: 0 days    Salvadore Dom 03/07/2017, 8:15 AM  Addendum:  Spoke with pharmacy, she recommended Zosyn be changed back to q 8 hours with slow infusion time. Done.  The patient will be started on a insulin sliding scale

## 2017-03-07 NOTE — Progress Notes (Addendum)
03/06/2017 12:48 AM  AICU.  POD#13  Severe Anemia/Leukocytosis/Early DIC  S) Still just c/o fatigue.  No SOB, no dizziness.  No Abdo-pelvic pain.  No chill.  O) Well oriented x 3.      Vital Signs    Report    07/07 0700 07/08 0659 07/08 0700 07/09 0101  Most Recent    Temp (F)      97.8-98.5  98.4 (36.9)  07/08 2356  Pulse Rate      103-112  110  07/08 2356  Resp      18-20  20  07/08 2356  BP      124/52-139/67  131/70  07/08 2356  SpO2 (%)      98-100  98  07/08 2328   Connecting to continuousTelemetry now.   Abdo:  Unchanged.  Soft, mildly to NT, no distention.  Vagina:  No bleeding.  Vaginal vault intact.  Results for VANELLOPE, PASSMORE (MRN [id number]) as of 03/07/2017 01:04  Ref. Range 03/06/2017 23:29 03/06/2017 23:29  WBC Latest Ref Range: 4.0 - 10.5 K/uL 41.1 (H)   RBC Latest Ref Range: 3.87 - 5.11 MIL/uL 1.71 (L)   Hemoglobin Latest Ref Range: 12.0 - 15.0 g/dL 4.7 (LL)   HCT Latest Ref Range: 36.0 - 46.0 % 13.7 (L)   MCV Latest Ref Range: 78.0 - 100.0 fL 80.1   MCH Latest Ref Range: 26.0 - 34.0 pg 27.5   MCHC Latest Ref Range: 30.0 - 36.0 g/dL 34.3   RDW Latest Ref Range: 11.5 - 15.5 % 19.1 (H)   Platelets Latest Ref Range: 150 - 400 K/uL 575 (H) 575 (H)  Neutrophils Latest Units: % 84   Lymphocytes Latest Units: % 11   Monocytes Relative Latest Units: % 4   Eosinophil Latest Units: % 1   Basophil Latest Units: % 1   NEUT# Latest Ref Range: 1.7 - 7.7 K/uL 34.6 (H)   Lymphocyte # Latest Ref Range: 0.7 - 4.0 K/uL 4.5 (H)   Monocyte # Latest Ref Range: 0.1 - 1.0 K/uL 1.6 (H)   Eosinophils Absolute Latest Ref Range: 0.0 - 0.7 K/uL 0.3   Basophils Absolute Latest Ref Range: 0.0 - 0.1 K/uL 0.2 (H)   Smear Review Unknown  NO SCHISTOCYTES SEEN  D-Dimer, Quant Latest Ref Range: 0.00 - 0.50 ug/mL-FEU  1.81 (H)  Fibrinogen Latest Ref Range: 210 - 475 mg/dL  419  Prothrombin Time Latest Ref Range: 11.4 - 15.2 seconds  15.5 (H)  INR Unknown  1.23  APTT Latest Ref  Range: 24 - 36 seconds  38 (H)    A) Severe Anemia with Leukocytosis, early DIC.  Mild increase in Liver Enzymes/Jaundice.  No Acute Abdomen.  No Vaginal bleeding.  Normal consciousness/Hemodynamically stable currently.  P) Transferred 2 U pRBC currently transfusing.  Putting 2nd IV in place to start Zosyn IV.  1 U FFP ordered.  Awaiting Abdomino-Pelvic CT.  Keeping NPO for possible need to proceed with Exploratory Laparotomy.  Dr. Talbert Nan informed of her patient's admission and findings.  Addendum:  Case discussed with Dr Dagmar Hait in consultation.  No current evidence of DIC.  Agrees with management and following with [place].  Princess Bruins MD  03/07/2017 at 1:14 am

## 2017-03-07 NOTE — Progress Notes (Addendum)
  Michelle Castillo Oct 04, 1965 [id number]  CT Abdomino-Pelvic:   1. Status post hysterectomy. Small irregular fluid collection measuring approximately 4.4 by 1 cm containing small gas bubbles between the bladder and upper vaginal cuff, could represent resolving postop fluid collection however other consideration would be pelvic abscess. 2. 3.2 cm hypodensity in the right pelvis presumably represents a cyst in the right ovary. 3. Indeterminate 4 cm hypodense lesion in the posterior spleen. Recommend nonemergent MRI for further evaluation. 4. 13 mm coarsely calcified left lung base nodule could represent hamartoma or large granuloma   Vitals:   03/07/17 0130 03/07/17 0141 03/07/17 0251 03/07/17 0311  BP: (!) 86/76  125/66 135/74  Pulse: (!) 113 (!) 109 (!) 115 (!) 113  Resp: 20 (!) 24  (!) 22  Temp:   98.7 F (37.1 C) 98.9 F (37.2 C)  TempSrc:   Oral Oral  SpO2: 100% 100% 100% 100%    A/P:  No evidence of large hematoma on CT scan.  Small fluid collection 4.4 cm x 1 cm at vaginal cuff/Possible abscess.  Hemolysis probably cause of severe anemia, due to sepsis?  Spleen pathology?  Hemoglobinopathy?   Covering with Zosyn.  Repeat CBC after 2 U pRBC.  Electrophoresis of Hb indicated?  Will discuss with Dr Dagmar Hait.  Will consult [facility] in am.  Princess Bruins MD, 3:56 AM 03/07/2017

## 2017-03-07 NOTE — Progress Notes (Signed)
Subjective: Patient reports that she is still tired and weak, no pain, voiding okay, slightly hungry.     Objective: I have reviewed patient's vital signs, intake and output and labs.  Today's Vitals   03/07/17 1732 03/07/17 1733 03/07/17 1734 03/07/17 1735  BP:      Pulse: 97 94 97   Resp: 17 16 20    Temp:      TempSrc:      SpO2: 100% 99% 100% 100%  Weight:      Height:      PainSc:       I/O last 3 completed shifts: In: 0737 [P.O.:720; Blood:700] Out: 1200 [Urine:1200] Total I/O In: 789 [P.O.:560; Blood:229] Out: 800 [Urine:800]  This SmartLink has not been configured with any valid records.   Lab Results  Component Value Date   WBC 30.8 (H) 03/07/2017   HGB 6.5 (LL) 03/07/2017   HCT 18.2 (L) 03/07/2017   MCV 83.5 03/07/2017   PLT 438 (H) 03/07/2017   .   General: alert, cooperative, icteric and no distress Resp: clear to auscultation bilaterally Cardio: S1, S2 normal GI: soft, non-tender; bowel sounds normal; no masses,  no organomegaly Extremities: extremities normal, atraumatic, no cyanosis or edema   Assessment/Plan: POD #13 s/p TLH/LOA/LSO/RS admitted last night with severe anemia and elevated WBC Possible pelvic abscess on CT Hemolytic anemia, lesion on her spleen being followed by hematology Vitals are currently stable, adequate urine output AODM, well controlled with metformin, higher now, on sliding scale  Plan: Getting her 4th unit of PRBC since last night Will check stat CBC and CMP 2 hours after the end of her transfusion around 9 pm If her hgb is not over 7, or if clinically worsening, will transfer to [facility] Plan CBC and CMP again in the am Will continue on telemetry She is getting steroids (per hematology) Will advance diet, will order sliding scale of insulin around meals and at hs Continue Zosyn for possible pelvic abscess  LOS: 0 days    Salvadore Dom 03/07/2017, 5:43 PM

## 2017-03-07 NOTE — Progress Notes (Addendum)
[place]  [phone]   HEMATOLOGY ONCOLOGY INPATIENT CONSULTATION   Michelle Castillo  DOB: 07/26/1966  MR#: [id number]  CSN#: [id number]     Requesting Physician: [facility] ; Dr. Talbert Nan  Patient Care Team: Renato Shin, MD as PCP - General Regina Eck, CNM as Referring Physician (Certified Nurse Midwife)    Reason for consult: Autoimmune hemolytic anemia  History of present illness:  Michelle Castillo had a hysterectomy and salpingectomy on 02/22/2017  but says she healed well. No blood transfusion was required. Denies any fever, vaginal bleeding or pain in pelvis area. Her fatigue came on yesterday and she was not able to do much. She has controlled diabetes. Denied any new antibiotics since surgery. She was taking an iron and potassium supplement. Denies night sweats or weight loss.   She does not smoke and drinks socially.  Breast cancer runs in her family but she gets checked regularly. Denies any lupus in her family history.    MEDICAL HISTORY:  Past Medical History:  Diagnosis Date  . Abnormal Pap smear of cervix 10-02-07   ASCUS  . ANEMIA, IRON DEFICIENCY   . DIABETES MELLITUS, TYPE II   . Fibroid   . Headache(784.0)   . HYPERCHOLESTEROLEMIA   . HYPERTENSION   . Hyperthyroidism    no meds, slightly elevated, MD will recheck in 3 months  . Migraines   . Unspecified disorder of thyroid   . URINARY INCONTINENCE     SURGICAL HISTORY: Past Surgical History:  Procedure Laterality Date  . ABDOMINAL HYSTERECTOMY    . APPENDECTOMY    . BARTHOLIN CYST MARSUPIALIZATION Right 02/22/2017   Procedure: BARTHOLIN CYST MARSUPIALIZATION;  Surgeon: Salvadore Dom, MD;  Location: [facility];  Service: Gynecology;  Laterality: Right;  . CESAREAN SECTION  1998  . CYSTOSCOPY N/A 02/22/2017   Procedure: CYSTOSCOPY;  Surgeon: Salvadore Dom, MD;  Location: [facility];  Service: Gynecology;  Laterality: N/A;  . LAPAROSCOPIC LYSIS OF ADHESIONS  N/A 02/22/2017   Procedure: EXTENSIVE LAPAROSCOPIC LYSIS OF ADHESIONS;  Surgeon: Salvadore Dom, MD;  Location: [facility];  Service: Gynecology;  Laterality: N/A;  Dr. Kieth Brightly   . OOPHORECTOMY Left 02/22/2017   Procedure: OOPHORECTOMY;  Surgeon: Salvadore Dom, MD;  Location: [facility];  Service: Gynecology;  Laterality: Left;  . TONSILLECTOMY AND ADENOIDECTOMY    . TOTAL LAPAROSCOPIC HYSTERECTOMY WITH SALPINGECTOMY Bilateral 02/22/2017   Procedure: HYSTERECTOMY TOTAL LAPAROSCOPIC WITH SALPINGECTOMY;  Surgeon: Salvadore Dom, MD;  Location: [facility];  Service: Gynecology;  Laterality: Bilateral;    SOCIAL HISTORY: Social History   Social History  . Marital status: Married    Spouse name: N/A  . Number of children: 1  . Years of education: N/A   Occupational History  . [employer]   Social History Main Topics  . Smoking status: Never Smoker  . Smokeless tobacco: Never Used  . Alcohol use No  . Drug use: No  . Sexual activity: Yes    Partners: Male    Birth control/ protection: Surgical   Other Topics Concern  . Not on file   Social History Narrative   Occupation: Works Orthoptist   Regular exercise-yes          FAMILY HISTORY: Family History  Problem Relation Age of Onset  . Diabetes Father   . Colon cancer Father 63       2002  . Hypertension Father   .  Stroke Father   . Heart disease Father        stints  . Diabetes Mother   . Thyroid disease Mother   . Hypertension Mother   . Breast cancer Maternal Aunt   . Breast cancer Maternal Aunt   . Breast cancer Maternal Aunt   . Breast cancer Maternal Aunt   . Breast cancer Maternal Aunt     ALLERGIES:  has No Known Allergies.  MEDICATIONS:  Current Facility-Administered Medications  Medication Dose Route Frequency Provider Last Rate Last Dose  . ibuprofen (ADVIL,MOTRIN) tablet 600 mg  600 mg Oral Q6H PRN Tresea Mall, CNM      . insulin aspart (novoLOG)  injection 0-15 Units  0-15 Units Subcutaneous TID WC Salvadore Dom, MD      . insulin aspart (novoLOG) injection 0-5 Units  0-5 Units Subcutaneous QHS Salvadore Dom, MD      . methylPREDNISolone sodium succinate (SOLU-MEDROL) 1,000 mg in sodium chloride 0.9 % 50 mL IVPB  1,000 mg Intravenous Q24 Hr x 2 Truitt Merle, MD 58 mL/hr at 03/07/17 1746 1,000 mg at 03/07/17 1746  . oxyCODONE-acetaminophen (PERCOCET/ROXICET) 5-325 MG per tablet 1-2 tablet  1-2 tablet Oral Q3H PRN Tresea Mall, CNM      . piperacillin-tazobactam (ZOSYN) IVPB 3.375 g  3.375 g Intravenous Q8H Salvadore Dom, MD 12.5 mL/hr at 03/07/17 1659 3.375 g at 03/07/17 1659  . prenatal multivitamin tablet 1 tablet  1 tablet Oral Q1200 Marcille Buffy D, CNM        REVIEW OF SYSTEMS:   Constitutional: Denies fevers, chills or abnormal night sweats (+)fatigue Eyes: Denies blurriness of vision, double vision or watery eyes Ears, nose, mouth, throat, and face: Denies mucositis or sore throat Respiratory: Denies cough, dyspnea or wheezes Cardiovascular: Denies palpitation, chest discomfort or lower extremity swelling Gastrointestinal:  Denies nausea, heartburn or change in bowel habits Skin: Denies abnormal skin rashes Lymphatics: Denies new lymphadenopathy or easy bruising Neurological:Denies numbness, tingling or new weaknesses Behavioral/Psych: Mood is stable, no new changes  All other systems were reviewed with the patient and are negative.  PHYSICAL EXAMINATION: ECOG PERFORMANCE STATUS: 2 - Symptomatic, <50% confined to bed  Vitals:   03/07/17 1834 03/07/17 1835  BP:    Pulse: 95 90  Resp: 16 12  Temp:     Filed Weights   03/07/17 0845  Weight: 162 lb (73.5 kg)    GENERAL:alert, no distress and comfortable SKIN: skin color, texture, turgor are normal, no rashes or significant lesions EYES: normal, conjunctiva are pink and non-injected, sclera clear OROPHARYNX:no exudate, no erythema and lips,  buccal mucosa, and tongue normal  NECK: supple, thyroid normal size, non-tender, without nodularity LYMPH:  no palpable lymphadenopathy in the cervical, axillary  (+) a few 1cm size palpable lymph nodes in bilateral groin area LUNGS: clear to auscultation and percussion with normal breathing effort HEART: regular rate & rhythm and no murmurs and no lower extremity edema ABDOMEN:abdomen soft, non-tender and normal bowel sounds Musculoskeletal:no cyanosis of digits and no clubbing  PSYCH: alert & oriented x 3 with fluent speech NEURO: no focal motor/sensory deficits  LABORATORY DATA:  I have reviewed the data as listed CBC Latest Ref Rng & Units 03/07/2017 03/06/2017 03/06/2017  WBC 4.0 - 10.5 K/uL 30.8(H) - 41.1(H)  Hemoglobin 12.0 - 15.0 g/dL 6.5(LL) - 4.7(LL)  Hematocrit 36.0 - 46.0 % 18.2(L) - 13.7(L)  Platelets 150 - 400 K/uL 438(H) 575(H) 575(H)    Recent Labs  06/28/16 1552  02/23/17 0545 03/06/17 2010 03/07/17 0525 03/07/17 0938  NA 138  < > 137 136  --  138  K 3.6  < > 3.2* 3.7  --  3.4*  CL 100  < > 105 102  --  107  CO2 30  < > 25 25  --  26  GLUCOSE 100*  < > 110* 185*  --  135*  BUN 10  < > 11 22*  --  23*  CREATININE 0.83  < > 0.85 0.57  --  0.58  CALCIUM 10.3  < > 8.3* 10.1  --  9.1  GFRNONAA  --   < > >60 >60  --  >60  GFRAA  --   < > >60 >60  --  >60  PROT 7.6  < > 6.2* 7.6  --  6.5  ALBUMIN 4.6  < > 3.2* 4.4  --  3.6  AST 14  < > 21 153*  --  179*  ALT 14  < > 11* 159*  --  148*  ALKPHOS 49  < > 32* 57  --  51  BILITOT 0.3  < > 0.5 8.0*  --  11.3*  BILIDIR 0.1  --   --   --  0.9*  --   < > = values in this interval not displayed.  PATHOLOGY Diagnosis 02/22/2017 Uterus, cervix and bilateral fallopian tubes, with left ovary CERVIX: - UNREMARKABLE. ENDOMETRIUM: - BENIGN SECRETORY-TYPE ENDOMETRIUM. MYOMETRIUM: - LEIOMYOMATA. SEROSA: - ESSENTIALLY UNREMARKABLE. LEFT ADNEXA: - BENIGN OVARY AND FALLOPIAN TUBE. RIGHT ADNEXA: - BENIGN FALLOPIAN  TUBE.  RADIOGRAPHIC STUDIES: I have personally reviewed the radiological images as listed and agreed with the findings in the report. Ct Abdomen Pelvis W Contrast  Result Date: 03/07/2017 CLINICAL DATA:  History of hysterectomy, elevated white count low hemoglobin EXAM: CT ABDOMEN AND PELVIS WITH CONTRAST TECHNIQUE: Multidetector CT imaging of the abdomen and pelvis was performed using the standard protocol following bolus administration of intravenous contrast. CONTRAST:  130mL ISOVUE-300 IOPAMIDOL (ISOVUE-300) INJECTION 61%, 32mL ISOVUE-300 IOPAMIDOL (ISOVUE-300) INJECTION 61% COMPARISON:  Pelvic ultrasound 01/18/2017 FINDINGS: Lower chest: Coarse calcified left posterior subpleural nodule, measuring 13 mm. No pleural effusion. Borderline cardiomegaly. Trace pericardial effusion. Hepatobiliary: No focal liver abnormality is seen. No gallstones, gallbladder wall thickening, or biliary dilatation. Pancreas: Unremarkable. No pancreatic ductal dilatation or surrounding inflammatory changes. Spleen: Vague hypodense mass with calcification in the medial aspect of the spleen measuring 4 by 3.6 cm. Adrenals/Urinary Tract: Adrenal glands are unremarkable. Kidneys are normal, without renal calculi, focal lesion, or hydronephrosis. Bladder is unremarkable. Stomach/Bowel: Stomach is within normal limits. Appendix consistent with history of appendectomy. No evidence of bowel wall thickening, distention, or inflammatory changes. Vascular/Lymphatic: Non aneurysmal aorta. subcentimeter retroperitoneal nodes. Reproductive: Status post hysterectomy. Other: No free air. Gas and fluid collection between the bladder and vaginal cuff, measuring approximately 4.4 by 1 cm. In the right pelvis, there is a 3.2 cm hypodense lesion. Which presumably represents a cyst within the right ovary. Musculoskeletal: No acute or significant osseous findings. IMPRESSION: 1. Status post hysterectomy. Small irregular fluid collection containing  small gas bubbles between the bladder and upper vaginal cuff, could represent resolving postop fluid collection however other consideration would be pelvic abscess. 2. 3.2 cm hypodensity in the right pelvis presumably represents a cyst in the right ovary. 3. Indeterminate 4 cm hypodense lesion in the posterior spleen. Recommend nonemergent MRI for further evaluation. 4. 13 mm coarsely calcified left lung base nodule  could represent hamartoma or large granuloma Electronically Signed   By: Donavan Foil M.D.   On: 03/07/2017 03:01    ASSESSMENT & PLAN: 51 year old female with recent history of hysterectomy, presented with severe anemia and fatigue  1. Autoimmune hemolytic anemia -She has developed severe anemia with hemoglobin 4.7. Lab work is consistent with hemolytic anemia, including very high indirect bilirubin, elevated LDH, increase her to count, haptoglobin still pending. Her Coombs test was positive for IgG , support autoimmune hemolytic anemia. -The etiology of autoimmune hemolytic anemia is uncertain at this point. She does have a indeterminate splenic lesion, and a few small inguinal lymph nodes, lymphoproliferative disorder, such as lymphoma, probably needs to be ruled out. Also I do not have high suspicion this is a high grade lymphoma. -I have reviewed her blood smear with all pathologist Dr. Rockney Ghee today, the smear is consistent with hemolytic anemia, no schistocytes, significant relative neutrophilia, a few myelocytes, no high suspicion for lymphoproliferative disorder on the blood smear -I recommend steroid therapy. I'll start her on supplemental 1000 mg daily for 2-3 days, then changed to oral prednisone 80 mg daily. If she responds well, we'll taper her off prednisone gradually -If she does not respond well to steroids, I'll consider adding Rituxan, and consider splenectomy for hemolytic anemia, and also ruled out lymphoma. -I agree with 2 more units of blood transfusion ( a total of  4units), she seemed hemodynamically stable, needs to watch her CBC every 8 hours. -If she needs additional treatment other than steroids, or becomes unstable, I will recommend her to be transferred to [facility] hospital. I believe she is stable enough to be watched at [facility] for the next 1-2 days. If her H/H stable in the next 3 days, she may be discharged from hospital and I'll follow her in my clinic closely.  2. Leukocytosis -Her WBC was normal at 10 days ago. She now presented with significant leukocytosis with predominant neutrophil, no blasts or the typical cells. Her peripheral blood smear was reviewed with our heme pathologist Dr. Gari Crown today -This is likely a reactive, will continue monitoring, to see if it resolves with antibiotics.  3. Spleen lesion -Indeterminate, low grade lymphoma may need to be ruled out. -May consider inguinal lymph node biopsy as outpatient if her AHA controlled  -May consider splenectomy if her AHA needs additional therapy  other than steroids   4. Pelvis fluid collection, ? abacess -She is on Zosyn, although she does not have fever or pelvic pain, she has significant leukocytosis, and now she is on steroids, I recommend her to continue Zosyn for now  -May consider repeat abdominal image in 2-4 weeks to see if it resolves   2. Diabetes - controlled  Recommendations:  -blood transfusion to keep Hb above 7.0 -I will start her on IV solumedrol 1g daily for 2 days then start oral steroids - check blood smear - biopsy lymph nodes in groin area, probably as outpt  - I will see her daily while in hospital - repeat CBC after blood transfusion, and monitor every 8 hrs, monitor Bili and LDH daily  -I have discussed with Dr. Talbert Nan in details.   All questions were answered. The patient knows to call the clinic with any problems, questions or concerns.   I spent a total of 60 mins for the consultation, including coordinating her care. I spent more than 50% time  on face-to-face counseling.  This document serves as a record of services personally performed by  Truitt Merle, MD. It was created on her behalf by Brandt Loosen, a trained medical scribe. The creation of this record is based on the scribe's personal observations and the provider's statements to them. This document has been checked and approved by the attending provider.     Truitt Merle, MD 03/07/2017 6:46 PM

## 2017-03-07 NOTE — Progress Notes (Signed)
eLink notified of pts' admission.

## 2017-03-07 NOTE — Progress Notes (Signed)
Results for CLYDENE, BURACK (MRN [id number]) as of 03/07/2017 09:06  Ref. Range 03/07/2017 08:19  WBC Latest Ref Range: 4.0 - 10.5 K/uL 30.8 (H)  RBC Latest Ref Range: 3.87 - 5.11 MIL/uL 2.18 (L)  Hemoglobin Latest Ref Range: 12.0 - 15.0 g/dL 6.5 (LL)  HCT Latest Ref Range: 36.0 - 46.0 % 18.2 (L)  MCV Latest Ref Range: 78.0 - 100.0 fL 83.5  MCH Latest Ref Range: 26.0 - 34.0 pg 29.8  MCHC Latest Ref Range: 30.0 - 36.0 g/dL 35.7  RDW Latest Ref Range: 11.5 - 15.5 % 17.8 (H)  Platelets Latest Ref Range: 150 - 400 K/uL 438 (H)  Critical value Called to Dr. Talbert Nan. No new orders given. Toya Smothers, RN

## 2017-03-07 NOTE — Progress Notes (Signed)
Patient transported to Radiology Dept for Abd CT.

## 2017-03-07 NOTE — Progress Notes (Signed)
Abdominal &Pelvic completed.

## 2017-03-07 NOTE — Progress Notes (Signed)
1st dose of Zosyn IV started.

## 2017-03-07 NOTE — Progress Notes (Signed)
Inpatient Diabetes Program Recommendations  AACE/ADA: New Consensus Statement on Inpatient Glycemic Control (2015)  Target Ranges:  Prepandial:   less than 140 mg/dL      Peak postprandial:   less than 180 mg/dL (1-2 hours)      Critically ill patients:  140 - 180 mg/dL   Results for ARLITA, BUFFKIN (MRN [id number]) as of 03/07/2017 08:12  Ref. Range 03/06/2017 20:10  Glucose Latest Ref Range: 65 - 99 mg/dL 185 (H)   Review of Glycemic Control  Diabetes history: DM2 Outpatient Diabetes medications: Metformin XR 500 mg QAM Current orders for Inpatient glycemic control: None  Inpatient Diabetes Program Recommendations: Correction (SSI): While inpatient, please consider ordering CBGs with Novolog correction scale Q4H.  Thanks, Barnie Alderman, RN, MSN, CDE Diabetes Coordinator Inpatient Diabetes Program [phone] (Team Pager from 8am to 5pm)

## 2017-03-08 ENCOUNTER — Encounter (HOSPITAL_COMMUNITY): Payer: Self-pay | Admitting: Internal Medicine

## 2017-03-08 ENCOUNTER — Inpatient Hospital Stay (HOSPITAL_COMMUNITY)
Admission: AD | Admit: 2017-03-08 | Discharge: 2017-03-10 | Disposition: A | Discharge status: Home / Self Care | Payer: 59 | Source: Ambulatory Visit | Attending: Internal Medicine | Admitting: Internal Medicine

## 2017-03-08 ENCOUNTER — Encounter (HOSPITAL_COMMUNITY): Payer: Self-pay | Admitting: *Deleted

## 2017-03-08 DIAGNOSIS — D739 Disease of spleen, unspecified: Secondary | ICD-10-CM | POA: Diagnosis not present

## 2017-03-08 DIAGNOSIS — I10 Essential (primary) hypertension: Secondary | ICD-10-CM | POA: Diagnosis not present

## 2017-03-08 DIAGNOSIS — D589 Hereditary hemolytic anemia, unspecified: Secondary | ICD-10-CM

## 2017-03-08 DIAGNOSIS — Z90710 Acquired absence of both cervix and uterus: Secondary | ICD-10-CM | POA: Diagnosis not present

## 2017-03-08 DIAGNOSIS — D591 Autoimmune hemolytic anemia, unspecified: Secondary | ICD-10-CM | POA: Diagnosis present

## 2017-03-08 DIAGNOSIS — R5383 Other fatigue: Secondary | ICD-10-CM | POA: Diagnosis present

## 2017-03-08 DIAGNOSIS — E0590 Thyrotoxicosis, unspecified without thyrotoxic crisis or storm: Secondary | ICD-10-CM | POA: Diagnosis present

## 2017-03-08 DIAGNOSIS — E119 Type 2 diabetes mellitus without complications: Secondary | ICD-10-CM

## 2017-03-08 DIAGNOSIS — Z803 Family history of malignant neoplasm of breast: Secondary | ICD-10-CM | POA: Diagnosis not present

## 2017-03-08 DIAGNOSIS — D72829 Elevated white blood cell count, unspecified: Secondary | ICD-10-CM | POA: Diagnosis not present

## 2017-03-08 DIAGNOSIS — E785 Hyperlipidemia, unspecified: Secondary | ICD-10-CM | POA: Diagnosis not present

## 2017-03-08 DIAGNOSIS — E1169 Type 2 diabetes mellitus with other specified complication: Secondary | ICD-10-CM

## 2017-03-08 DIAGNOSIS — E7800 Pure hypercholesterolemia, unspecified: Secondary | ICD-10-CM | POA: Diagnosis present

## 2017-03-08 DIAGNOSIS — R188 Other ascites: Secondary | ICD-10-CM | POA: Diagnosis not present

## 2017-03-08 DIAGNOSIS — D598 Other acquired hemolytic anemias: Secondary | ICD-10-CM | POA: Diagnosis not present

## 2017-03-08 DIAGNOSIS — D7389 Other diseases of spleen: Secondary | ICD-10-CM | POA: Diagnosis present

## 2017-03-08 LAB — TYPE AND SCREEN
ABO/RH(D): A POS
ANTIBODY SCREEN: NEGATIVE
UNIT DIVISION: 0
UNIT DIVISION: 0
Unit division: 0
Unit division: 0

## 2017-03-08 LAB — HEPATITIS C ANTIBODY: HCV Ab: <0.1 s/co ratio (ref 0.0–0.9)

## 2017-03-08 LAB — CBC WITH DIFFERENTIAL/PLATELET
BASOS ABS: 0.1 10*3/uL (ref 0.0–0.1)
Basophils Absolute: 0 10*3/uL (ref 0.0–0.1)
Basophils Relative: 0 %
Basophils Relative: 0 %
EOS PCT: 0 %
Eosinophils Absolute: 0.3 10*3/uL (ref 0.0–0.7)
Eosinophils Absolute: 0.1 10*3/uL (ref 0.0–0.7)
Eosinophils Relative: 1 %
HCT: 18.2 % — ABNORMAL LOW (ref 36.0–46.0)
HEMATOCRIT: 27.9 % — AB (ref 36.0–46.0)
HEMOGLOBIN: 6.5 g/dL — AB (ref 12.0–15.0)
HEMOGLOBIN: 9.7 g/dL — AB (ref 12.0–15.0)
LYMPHS ABS: 2.1 10*3/uL (ref 0.7–4.0)
LYMPHS PCT: 8 %
Lymphocytes Relative: 13 %
Lymphs Abs: 4 10*3/uL (ref 0.7–4.0)
MCH: 29.8 pg (ref 26.0–34.0)
MCH: 30 pg (ref 26.0–34.0)
MCHC: 35.7 g/dL (ref 30.0–36.0)
MCHC: 34.8 g/dL (ref 30.0–36.0)
MCV: 83.5 fL (ref 78.0–100.0)
MCV: 86.4 fL (ref 78.0–100.0)
MONOS PCT: 3 %
Monocytes Absolute: 0.9 10*3/uL (ref 0.1–1.0)
Monocytes Absolute: 0.2 10*3/uL (ref 0.1–1.0)
Monocytes Relative: 1 %
NEUTROS ABS: 23.7 10*3/uL — AB (ref 1.7–7.7)
NEUTROS PCT: 83 %
NEUTROS PCT: 91 %
Neutro Abs: 25.6 10*3/uL — ABNORMAL HIGH (ref 1.7–7.7)
PLATELETS: 362 10*3/uL (ref 150–400)
Platelets: 438 10*3/uL — ABNORMAL HIGH (ref 150–400)
RBC: 2.18 MIL/uL — AB (ref 3.87–5.11)
RBC: 3.23 MIL/uL — AB (ref 3.87–5.11)
RDW: 17.8 % — ABNORMAL HIGH (ref 11.5–15.5)
RDW: 16.1 % — ABNORMAL HIGH (ref 11.5–15.5)
WBC: 30.8 10*3/uL — AB (ref 4.0–10.5)
WBC: 26.1 10*3/uL — AB (ref 4.0–10.5)

## 2017-03-08 LAB — COMPREHENSIVE METABOLIC PANEL
ALK PHOS: 64 U/L (ref 38–126)
ALT: 160 U/L — AB (ref 14–54)
AST: 91 U/L — AB (ref 15–41)
Albumin: 4.2 g/dL (ref 3.5–5.0)
Anion gap: 9 (ref 5–15)
BILIRUBIN TOTAL: 12.8 mg/dL — AB (ref 0.3–1.2)
BUN: 27 mg/dL — AB (ref 6–20)
CALCIUM: 9.5 mg/dL (ref 8.9–10.3)
CHLORIDE: 105 mmol/L (ref 101–111)
CO2: 24 mmol/L (ref 22–32)
CREATININE: 0.73 mg/dL (ref 0.44–1.00)
Glucose, Bld: 205 mg/dL — ABNORMAL HIGH (ref 65–99)
Potassium: 3.6 mmol/L (ref 3.5–5.1)
Sodium: 138 mmol/L (ref 135–145)
Total Protein: 7.7 g/dL (ref 6.5–8.1)

## 2017-03-08 LAB — BPAM FFP
BLOOD PRODUCT EXPIRATION DATE: 201807142359
Blood Product Expiration Date: 201807142359
ISSUE DATE / TIME: 201807090748
UNIT TYPE AND RH: 6200
Unit Type and Rh: 8400

## 2017-03-08 LAB — GLUCOSE, CAPILLARY
GLUCOSE-CAPILLARY: 182 mg/dL — AB (ref 65–99)
GLUCOSE-CAPILLARY: 136 mg/dL — AB (ref 65–99)
GLUCOSE-CAPILLARY: 151 mg/dL — AB (ref 65–99)
Glucose-Capillary: 188 mg/dL — ABNORMAL HIGH (ref 65–99)

## 2017-03-08 LAB — PREPARE FRESH FROZEN PLASMA
Unit division: 0
Unit division: 0

## 2017-03-08 LAB — CBC
HCT: 24.2 % — ABNORMAL LOW (ref 36.0–46.0)
HEMATOCRIT: 26.4 % — AB (ref 36.0–46.0)
HEMOGLOBIN: 8.6 g/dL — AB (ref 12.0–15.0)
HEMOGLOBIN: 9.3 g/dL — AB (ref 12.0–15.0)
MCH: 30.6 pg (ref 26.0–34.0)
MCH: 31.5 pg (ref 26.0–34.0)
MCHC: 35.2 g/dL (ref 30.0–36.0)
MCHC: 35.5 g/dL (ref 30.0–36.0)
MCV: 86.8 fL (ref 78.0–100.0)
MCV: 88.6 fL (ref 78.0–100.0)
PLATELETS: 346 10*3/uL (ref 150–400)
Platelets: 372 10*3/uL (ref 150–400)
RBC: 3.04 MIL/uL — AB (ref 3.87–5.11)
RBC: 2.73 MIL/uL — ABNORMAL LOW (ref 3.87–5.11)
RDW: 16 % — ABNORMAL HIGH (ref 11.5–15.5)
RDW: 16.1 % — ABNORMAL HIGH (ref 11.5–15.5)
WBC: 21.3 10*3/uL — AB (ref 4.0–10.5)
WBC: 24.7 10*3/uL — ABNORMAL HIGH (ref 4.0–10.5)

## 2017-03-08 LAB — BPAM RBC
BLOOD PRODUCT EXPIRATION DATE: 201807282359
BLOOD PRODUCT EXPIRATION DATE: 201807282359
BLOOD PRODUCT EXPIRATION DATE: 201807312359
Blood Product Expiration Date: 201807312359
ISSUE DATE / TIME: 201807082325
ISSUE DATE / TIME: 201807090234
ISSUE DATE / TIME: 201807091406
ISSUE DATE / TIME: 201807091746
UNIT TYPE AND RH: 6200
Unit Type and Rh: 6200
Unit Type and Rh: 6200
Unit Type and Rh: 6200

## 2017-03-08 LAB — HEPATITIS A ANTIBODY, IGM: HEP A IGM: NEGATIVE

## 2017-03-08 LAB — LACTATE DEHYDROGENASE: LDH: 618 U/L — ABNORMAL HIGH (ref 98–192)

## 2017-03-08 LAB — HEPATITIS A ANTIBODY, TOTAL: HEP A TOTAL AB: NEGATIVE

## 2017-03-08 LAB — HAPTOGLOBIN: Haptoglobin: <10 mg/dL — ABNORMAL LOW (ref 34–200)

## 2017-03-08 NOTE — Progress Notes (Signed)
Pt states she having family bring her lunch.  Instructed to inform RN when her lunch arrives so that we may check CBG.  Pt verb understanding.

## 2017-03-08 NOTE — H&P (Signed)
History and Physical    FAIGY STRETCH RAX:[id number] DOB: 15-Jan-1966 DOA: 03/08/2017  PCP: Renato Shin, MD Consultants:  Concha Norway Talbert Nan; Burr Medico - oncology Patient coming from: Home - lives with son (sometimes); NOK: mother, [phone]  Chief Complaint: transfer from [facility] per Dr. Burr Medico  HPI: Michelle Castillo is a 51 y.o. female with medical history significant of DM; HLD; HTN; hyperthyroidism; and recent hysterectomy who returned to [facility] with symptomatic anemia and was transferred to [facility] today.  She reports that Sunday, she didn't feel well - very weak, difficulty standing.  She was unable to stand up, called on-call GYN (TAH 2 weeks ago).  She encouraged patient to eat.  Her mother came over and looked at her, said she looked bad and sent her to [place], her Hgb was really low and they kept her.  Transfused 4 units PRBC, steroids, CT, antitbiotic for "a little abrasion where I had my surgery". Hgb improved but then decreased again.  So they sent her here for heme to assume care.  She feels pretty well.  Symptoms from  Sunday have improved, now able to get up and walk to the bathroom without assistance.  No bleeding.  No SOB.  No fevers.  Per Dr. Ernestina Penna note from today, she responded well to the transfusion with Hgb increase from 6.5 to 9.5 after 2 units PRBC, but she was 8.6 this afternoon.  She has diagnosed the patient with autoimmune hemolytic anemia and there are concerns for a splenic lesion and a possible pelvic abscess.  The patient has been receiving IV Solumedrol which should be continued for another 1-2 days before switching to oral prednisone.  Persistently high bili and LDH indicate ongoing hemolysis.  She recommends trending CBC q8h with transfusion for Hgb <7.  Consider Rituxan if patient is not responding to steroids in a few days, as well as splenectomy.  Needs IR consult for LN biopsy.    Review of Systems: As per HPI; otherwise review of systems reviewed and  negative.   Ambulatory Status:  Ambulates without asssitance  Past Medical History:  Diagnosis Date  . ANEMIA, IRON DEFICIENCY   . DIABETES MELLITUS, TYPE II   . Headache(784.0)   . HYPERCHOLESTEROLEMIA   . HYPERTENSION   . Hyperthyroidism    no meds, slightly elevated, MD will recheck in 3 months  . Migraines   . URINARY INCONTINENCE     Past Surgical History:  Procedure Laterality Date  . ABDOMINAL HYSTERECTOMY    . APPENDECTOMY    . BARTHOLIN CYST MARSUPIALIZATION Right 02/22/2017   Procedure: BARTHOLIN CYST MARSUPIALIZATION;  Surgeon: Salvadore Dom, MD;  Location: [facility];  Service: Gynecology;  Laterality: Right;  . CESAREAN SECTION  1998  . CYSTOSCOPY N/A 02/22/2017   Procedure: CYSTOSCOPY;  Surgeon: Salvadore Dom, MD;  Location: [facility];  Service: Gynecology;  Laterality: N/A;  . LAPAROSCOPIC LYSIS OF ADHESIONS N/A 02/22/2017   Procedure: EXTENSIVE LAPAROSCOPIC LYSIS OF ADHESIONS;  Surgeon: Salvadore Dom, MD;  Location: [facility];  Service: Gynecology;  Laterality: N/A;  Dr. Kieth Brightly   . OOPHORECTOMY Left 02/22/2017   Procedure: OOPHORECTOMY;  Surgeon: Salvadore Dom, MD;  Location: [facility];  Service: Gynecology;  Laterality: Left;  . TONSILLECTOMY AND ADENOIDECTOMY    . TOTAL LAPAROSCOPIC HYSTERECTOMY WITH SALPINGECTOMY Bilateral 02/22/2017   Procedure: HYSTERECTOMY TOTAL LAPAROSCOPIC WITH SALPINGECTOMY;  Surgeon: Salvadore Dom, MD;  Location: [facility];  Service: Gynecology;  Laterality: Bilateral;  Social History   Social History  . Marital status: Married    Spouse name: N/A  . Number of children: 1  . Years of education: N/A   Occupational History  . Development worker, [employer]    Social History Main Topics  . Smoking status: Never Smoker  . Smokeless tobacco: Never Used  . Alcohol use 0.0 oz/week     Comment: occasionally  . Drug use: No  . Sexual activity: Yes    Partners: Male    Birth control/ protection: Surgical   Other Topics  Concern  . Not on file   Social History Narrative   Occupation: Works Orthoptist   Regular exercise-yes          No Known Allergies  Family History  Problem Relation Age of Onset  . Diabetes Father   . Colon cancer Father 64       2002  . Hypertension Father   . Stroke Father   . Heart disease Father        stints  . Diabetes Mother   . Thyroid disease Mother   . Hypertension Mother   . Breast cancer Maternal Aunt   . Breast cancer Maternal Aunt   . Breast cancer Maternal Aunt   . Breast cancer Maternal Aunt   . Breast cancer Maternal Aunt     Prior to Admission medications   Medication Sig Start Date End Date Taking? Authorizing Provider  docusate sodium (COLACE) 100 MG capsule Take 1 capsule (100 mg total) by mouth 2 (two) times daily. Patient taking differently: Take 100 mg by mouth daily.  02/23/17  Yes Salvadore Dom, MD  hydrochlorothiazide (HYDRODIURIL) 25 MG tablet Take 1 tablet (25 mg total) by mouth daily. for high blood pressure 06/28/16  Yes Renato Shin, MD  ibuprofen (ADVIL,MOTRIN) 800 MG tablet Take 1 tablet (800 mg total) by mouth every 8 (eight) hours as needed. Patient taking differently: Take 800 mg by mouth every 8 (eight) hours as needed for moderate pain.  02/23/17  Yes Salvadore Dom, MD  metFORMIN (GLUCOPHAGE-XR) 500 MG 24 hr tablet TAKE (2) TABLETS TWICE DAILY. Patient taking differently: Take 1,000 mg by mouth 2 (two) times daily.  06/28/16  Yes Renato Shin, MD  oxyCODONE-acetaminophen (PERCOCET/ROXICET) 5-325 MG tablet Take 1-2 tablets by mouth every 4 (four) hours as needed (moderate to severe pain (when tolerating fluids)). 02/23/17  Yes Salvadore Dom, MD  potassium chloride SA (K-DUR,KLOR-CON) 20 MEQ tablet Take 1 tablet (20 mEq total) by mouth daily. 02/23/17  Yes Salvadore Dom, MD  SUMAtriptan (IMITREX) 100 MG tablet TAKE 1 TABLET AT ONSET-MAY REPEAT IN 2 HOURS AS NEEDED- NO MORE THAN 2 IN 24 HOURS. Patient  taking differently: Take 100 mg by mouth every 2 (two) hours as needed for migraine.  06/28/16  Yes Renato Shin, MD    Physical Exam: Vitals:   03/08/17 2139  BP: 137/90  Pulse: 86  Resp: 18  Temp: 98.7 F (37.1 C)  TempSrc: Oral  SpO2: 100%     General:  Appears calm and comfortable and is NAD Eyes:  PERRL, EOMI, normal lids, iris ENT:  grossly normal hearing, lips & tongue, mmm Neck:  no LAD, masses or thyromegaly Cardiovascular:  RRR, 2/6 systolic murmur, no r/g. No LE edema.  Respiratory:  CTA bilaterally, no w/r/r. Normal respiratory effort. Abdomen:  soft, ntnd, NABS Skin:  no rash or induration seen on limited exam Musculoskeletal:  grossly normal tone BUE/BLE, good ROM, no  bony abnormality Psychiatric:  grossly normal mood and affect, speech fluent and appropriate, AOx3 Neurologic:  CN 2-12 grossly intact, moves all extremities in coordinated fashion, sensation intact  Labs on Admission: I have personally reviewed following labs and imaging studies  CBC:  Recent Labs Lab 03/06/17 2329 03/07/17 0819 03/07/17 2131 03/08/17 0001 03/08/17 0546 03/08/17 1515 03/09/17 0054  WBC 41.1* 30.8* 22.4* 21.3* 26.1* 24.7* 20.5*  NEUTROABS 34.6* 25.6*  --   --  23.7*  --   --   HGB 4.7* 6.5* 9.5* 9.3* 9.7* 8.6* 8.4*  HCT 13.7* 18.2* 26.6* 26.4* 27.9* 24.2* 23.7*  MCV 80.1 83.5 86.6 86.8 86.4 88.6 87.1  PLT 575*  575* 438* 359 346 362 372 270   Basic Metabolic Panel:  Recent Labs Lab 03/06/17 2010 03/07/17 0938 03/07/17 2131 03/08/17 0546 03/09/17 0054  NA 136 138 137 138 139  K 3.7 3.4* 3.8 3.6 3.7  CL 102 107 105 105 107  CO2 25 26 25 24 24   GLUCOSE 185* 135* 209* 205* 253*  BUN 22* 23* 27* 27* 22*  CREATININE 0.57 0.58 0.61 0.73 0.68  CALCIUM 10.1 9.1 9.1 9.5 9.0   GFR: Estimated Creatinine Clearance: 88.2 mL/min (by C-G formula based on SCr of 0.68 mg/dL). Liver Function Tests:  Recent Labs Lab 03/06/17 2010 03/07/17 0938 03/07/17 2131  03/08/17 0546  AST 153* 179* 142* 91*  ALT 159* 148* 162* 160*  ALKPHOS 57 51 55 64  BILITOT 8.0* 11.3* 16.2* 12.8*  PROT 7.6 6.5 7.2 7.7  ALBUMIN 4.4 3.6 3.9 4.2   No results for input(s): LIPASE, AMYLASE in the last 168 hours. No results for input(s): AMMONIA in the last 168 hours. Coagulation Profile:  Recent Labs Lab 03/06/17 2329  INR 1.23   Cardiac Enzymes: No results for input(s): CKTOTAL, CKMB, CKMBINDEX, TROPONINI in the last 168 hours. BNP (last 3 results) No results for input(s): PROBNP in the last 8760 hours. HbA1C: No results for input(s): HGBA1C in the last 72 hours. CBG:  Recent Labs Lab 03/07/17 2320 03/08/17 0753 03/08/17 1412 03/08/17 1851 03/08/17 2138  GLUCAP 205* 182* 136* 151* 188*   Lipid Profile: No results for input(s): CHOL, HDL, LDLCALC, TRIG, CHOLHDL, LDLDIRECT in the last 72 hours. Thyroid Function Tests: No results for input(s): TSH, T4TOTAL, FREET4, T3FREE, THYROIDAB in the last 72 hours. Anemia Panel:  Recent Labs  03/07/17 0938 03/07/17 2131  FERRITIN 1,071*  --   TIBC 274  --   IRON 246*  --   RETICCTPCT 6.1* 4.2*   Urine analysis:    Component Value Date/Time   COLORURINE AMBER (A) 03/06/2017 2045   APPEARANCEUR CLEAR 03/06/2017 2045   LABSPEC 1.021 03/06/2017 2045   PHURINE 5.0 03/06/2017 2045   GLUCOSEU 150 (A) 03/06/2017 2045   GLUCOSEU NEGATIVE 06/28/2016 1552   HGBUR LARGE (A) 03/06/2017 2045   BILIRUBINUR NEGATIVE 03/06/2017 2045   KETONESUR NEGATIVE 03/06/2017 2045   PROTEINUR 100 (A) 03/06/2017 2045   UROBILINOGEN 0.2 06/28/2016 1552   NITRITE NEGATIVE 03/06/2017 2045   LEUKOCYTESUR NEGATIVE 03/06/2017 2045    Creatinine Clearance: Estimated Creatinine Clearance: 88.2 mL/min (by C-G formula based on SCr of 0.68 mg/dL).  Sepsis Labs: @LABRCNTIP (procalcitonin:4,lacticidven:4) ) Recent Results (from the past 240 hour(s))  Culture, blood (Routine X 2) w Reflex to ID Panel     Status: None (Preliminary  result)   Collection Time: 03/07/17  5:30 AM  Result Value Ref Range Status   Specimen Description BLOOD RIGHT ARM  Final   Special Requests   Final    BOTTLES DRAWN AEROBIC AND ANAEROBIC Blood Culture adequate volume   Culture   Final    NO GROWTH 1 DAY Performed at [facility], [place] [address], [place], [place] [address]    Report Status PENDING  Incomplete  Culture, blood (Routine X 2) w Reflex to ID Panel     Status: None (Preliminary result)   Collection Time: 03/07/17  5:40 AM  Result Value Ref Range Status   Specimen Description BLOOD RIGHT ARM  Final   Special Requests   Final    BOTTLES DRAWN AEROBIC AND ANAEROBIC Blood Culture adequate volume   Culture   Final    NO GROWTH 1 DAY Performed at [facility], [place] [address] [place],  [address]    Report Status PENDING  Incomplete     Radiological Exams on Admission: Ct Abdomen Pelvis W Contrast  Result Date: 03/07/2017 CLINICAL DATA:  History of hysterectomy, elevated white count low hemoglobin EXAM: CT ABDOMEN AND PELVIS WITH CONTRAST TECHNIQUE: Multidetector CT imaging of the abdomen and pelvis was performed using the standard protocol following bolus administration of intravenous contrast. CONTRAST:  163mL ISOVUE-300 IOPAMIDOL (ISOVUE-300) INJECTION 61%, 33mL ISOVUE-300 IOPAMIDOL (ISOVUE-300) INJECTION 61% COMPARISON:  Pelvic ultrasound 01/18/2017 FINDINGS: Lower chest: Coarse calcified left posterior subpleural nodule, measuring 13 mm. No pleural effusion. Borderline cardiomegaly. Trace pericardial effusion. Hepatobiliary: No focal liver abnormality is seen. No gallstones, gallbladder wall thickening, or biliary dilatation. Pancreas: Unremarkable. No pancreatic ductal dilatation or surrounding inflammatory changes. Spleen: Vague hypodense mass with calcification in the medial aspect of the spleen measuring 4 by 3.6 cm. Adrenals/Urinary Tract: Adrenal glands are unremarkable. Kidneys are normal, without renal  calculi, focal lesion, or hydronephrosis. Bladder is unremarkable. Stomach/Bowel: Stomach is within normal limits. Appendix consistent with history of appendectomy. No evidence of bowel wall thickening, distention, or inflammatory changes. Vascular/Lymphatic: Non aneurysmal aorta. subcentimeter retroperitoneal nodes. Reproductive: Status post hysterectomy. Other: No free air. Gas and fluid collection between the bladder and vaginal cuff, measuring approximately 4.4 by 1 cm. In the right pelvis, there is a 3.2 cm hypodense lesion. Which presumably represents a cyst within the right ovary. Musculoskeletal: No acute or significant osseous findings. IMPRESSION: 1. Status post hysterectomy. Small irregular fluid collection containing small gas bubbles between the bladder and upper vaginal cuff, could represent resolving postop fluid collection however other consideration would be pelvic abscess. 2. 3.2 cm hypodensity in the right pelvis presumably represents a cyst in the right ovary. 3. Indeterminate 4 cm hypodense lesion in the posterior spleen. Recommend nonemergent MRI for further evaluation. 4. 13 mm coarsely calcified left lung base nodule could represent hamartoma or large granuloma Electronically Signed   By: Donavan Foil M.D.   On: 03/07/2017 03:01    EKG: Not done  Assessment/Plan Principal Problem:   Autoimmune hemolytic anemia (HCC) Active Problems:   Hyperthyroidism   Diabetes ([place])   HYPERCHOLESTEROLEMIA   Essential hypertension   Autoimmune hemolytic anemia -WBC 20.5; down from 24.7, 26.1 on 7/10 -Hgb 8.4; prior 8.6 at 1515; 9.7 on 7/10; 9.3 on 7/10; 9.5 on 7/9 -AST 91/ALT 160 -Bilirubin 12.8 -LDH 618 -Hopefully, current slow downtrend in Hgb is due to equilibration rather than ongoing hemolysis -However, patient does have ongoing hyperbilirubinemia and LDL which is suggestive of persistent disease  -CT indicated concern for both splenic lesion and pelvic abscess; she does not have  apparent fever or other infectious concerns at this  time and do the abscess may not need urgent evaluation.  The radiologist recommendation was for splenic MRI non-urgently, as well.  The decision has not yet been made about whether patient will need splenectomy, so would defer MRI until this is decided. -Will continue IV Solumedrol 1gm daily for now with plan to transition to PO Prednisone once patient is responding. -If no improvement, she may need Rituxan; will defer to Dr. Burr Medico. -Will trend CBC q8h with plan for transfusion for Hgb <7. -If ongoing downtrend tomorrow, consider PICC line placement. -Will consult IR for possible inguinal LN biopsy; NPO after midnight.  DM -Glucose 188 -Hold Metformin -Cover with SSI -Recent A1c <6 so suspect that current hyperglycemia is steroid-induced  Hyperthyroidism -Abnormal thyroid testing in 4/18 with TSH 0.25.   -Will repeat TSH and free T4 now.  HTN -Takes HCTZ at home, will continue   DVT prophylaxis:  SCDs Code Status:  Full - confirmed with patient/family Family Communication: Son (?) present throughout evaluation  Disposition Plan:  Home once clinically improved Consults called: Heme/onc; IR  Admission status: Admit - It is my clinical opinion that admission to INPATIENT is reasonable and necessary because this patient will require at least 2 midnights in the hospital to treat this condition based on the medical complexity of the problems presented.  Given the aforementioned information, the predictability of an adverse outcome is felt to be significant.    Karmen Bongo MD [facility]  If 7PM-7AM, please contact night-coverage [email or web address] Password [email or web address]  03/09/2017, 1:31 AM

## 2017-03-08 NOTE — Progress Notes (Signed)
Dr Talbert Nan notified of IV infiltration & pt concern of getting more IV therapy & blood draws.  Request made for consideration of PICC line vs peripheral IV.  Awaiting MD order.

## 2017-03-08 NOTE — Progress Notes (Addendum)
POD#14 s/p TLH/LOA/LSO/RS, fibroid uterus, stage 4 endometriosis. Admitted yesterday with Hemolytic anemia, possible pelvic abscess. S/p PRBC x 4 Subjective: Patient reports she is feeling better this morning, not as weak. No pain, eating, voiding, +BM.   Objective: I have reviewed patient's vital signs, intake and output and labs.  Today's Vitals   03/08/17 0625 03/08/17 0626 03/08/17 0627 03/08/17 0700  BP:    126/76  Pulse: 81 80  86  Resp: 16 15  (!) 23  Temp:      TempSrc:      SpO2: 100% 100% 100% 100%  Weight:      Height:      PainSc:    0-No pain   Lab Results  Component Value Date   WBC 26.1 (H) 03/08/2017   HGB 9.7 (L) 03/08/2017   HCT 27.9 (L) 03/08/2017   MCV 86.4 03/08/2017   PLT 362 03/08/2017   CMP abnormals: glucose 205, AST 91 (down from 142), ALT 160 (162), bili 12.8 (down from 16.2)  General: alert, cooperative, icteric and no distress Resp: clear to auscultation bilaterally Cardio: regular rate and rhythm, S1, S2 normal, no murmur, click, rub or gallop GI: soft, non-tender; bowel sounds normal; no masses,  no organomegaly Extremities: extremities normal, atraumatic, no cyanosis or edema   Assessment/Plan: Hemolytic anemia, being followed by hematology. Responding to steroids Possible pelvic abscess, not clear (no fevers, no pain). Will continue antibiotics WBC back up this am, likely from the steroids (still down from admission) AODM, typically controlled with metformin, given possible infection and steroids glucose is elevated, on insulin sliding scale LFT's, stable or coming down Will continue to follow labs, vitals, exam, antibiotics and steroids    LOS: 1 day    Salvadore Dom 03/08/2017, 7:18 AM   Addendum:  The patient's hgb is now dropping despite steroids. Now 8.6 gm/dl. I have spoken with Dr Burr Medico, will transfer to [facility].

## 2017-03-08 NOTE — Progress Notes (Signed)
Brianne, RN house supervisor made aware of need for PICC line. Recommendation made to call anesthesia if access needed immediately secondary to IV team not being in house currently.  Will notify MD.

## 2017-03-08 NOTE — Progress Notes (Signed)
Dr Talbert Nan aware that PICC line will not be placed at this time & that transfer may be approx 1-2hrs ETA.  Orders rec'd to hold peripheral IV/CL placement until evaluated by IV team at [facility].

## 2017-03-08 NOTE — Progress Notes (Signed)
Michelle Castillo   DOB:1966-02-03   TD#:[id number]   TGG#:[id number]  Hematology follow up   Subjective: Pt responded well to blood transfusion, Hb increased from 6.5 to 9.5g/dl after 2u RBC, Hb dropped to 8.6g/dl this afternoon. She feels better overall after transfusion, no other complains.     Objective:  Vitals:   03/08/17 1619 03/08/17 1956  BP: 129/83 (!) 149/92  Pulse: 95 96  Resp: 16 20  Temp: 98.7 F (37.1 C) 98.1 F (36.7 C)    Body mass index is 26.15 kg/m.  Intake/Output Summary (Last 24 hours) at 03/08/17 2001 Last data filed at 03/08/17 1100  Gross per 24 hour  Intake              300 ml  Output              950 ml  Net             -650 ml     Sclerae unicteric  Oropharynx clear  No peripheral adenopathy  Lungs clear -- no rales or rhonchi  Heart regular rate and rhythm  Abdomen benign  MSK no focal spinal tenderness, no peripheral edema  Neuro nonfocal   CBG (last 3)   Recent Labs  03/08/17 0753 03/08/17 1412 03/08/17 1851  GLUCAP 182* 136* 151*     Labs:  Lab Results  Component Value Date   WBC 24.7 (H) 03/08/2017   HGB 8.6 (L) 03/08/2017   HCT 24.2 (L) 03/08/2017   MCV 88.6 03/08/2017   PLT 372 03/08/2017   NEUTROABS 23.7 (H) 03/08/2017   CMP Latest Ref Rng & Units 03/08/2017 03/07/2017 03/07/2017  Glucose 65 - 99 mg/dL 205(H) 209(H) 135(H)  BUN 6 - 20 mg/dL 27(H) 27(H) 23(H)  Creatinine 0.44 - 1.00 mg/dL 0.73 0.61 0.58  Sodium 135 - 145 mmol/L 138 137 138  Potassium 3.5 - 5.1 mmol/L 3.6 3.8 3.4(L)  Chloride 101 - 111 mmol/L 105 105 107  CO2 22 - 32 mmol/L 24 25 26   Calcium 8.9 - 10.3 mg/dL 9.5 9.1 9.1  Total Protein 6.5 - 8.1 g/dL 7.7 7.2 6.5  Total Bilirubin 0.3 - 1.2 mg/dL 12.8(H) 16.2(H) 11.3(H)  Alkaline Phos 38 - 126 U/L 64 55 51  AST 15 - 41 U/L 91(H) 142(H) 179(H)  ALT 14 - 54 U/L 160(H) 162(H) 148(H)     Urine Studies No results for input(s): UHGB, CRYS in the last 72 hours.  Invalid input(s): UACOL, UAPR, USPG, UPH, UTP,  UGL, UKET, UBIL, UNIT, UROB, [place], UEPI, UWBC, Duwayne Heck [place], [place]  Basic Metabolic Panel:  Recent Labs Lab 03/06/17 2010 03/07/17 0938 03/07/17 2131 03/08/17 0546  NA 136 138 137 138  K 3.7 3.4* 3.8 3.6  CL 102 107 105 105  CO2 25 26 25 24   GLUCOSE 185* 135* 209* 205*  BUN 22* 23* 27* 27*  CREATININE 0.57 0.58 0.61 0.73  CALCIUM 10.1 9.1 9.1 9.5   GFR Estimated Creatinine Clearance: 88.2 mL/min (by C-G formula based on SCr of 0.73 mg/dL). Liver Function Tests:  Recent Labs Lab 03/06/17 2010 03/07/17 0938 03/07/17 2131 03/08/17 0546  AST 153* 179* 142* 91*  ALT 159* 148* 162* 160*  ALKPHOS 57 51 55 64  BILITOT 8.0* 11.3* 16.2* 12.8*  PROT 7.6 6.5 7.2 7.7  ALBUMIN 4.4 3.6 3.9 4.2   No results for input(s): LIPASE, AMYLASE in the last 168 hours. No results for input(s): AMMONIA in the last 168 hours. Coagulation profile  Recent Labs Lab 03/06/17 2329  INR 1.23    CBC:  Recent Labs Lab 03/06/17 2329 03/07/17 0819 03/07/17 2131 03/08/17 0001 03/08/17 0546 03/08/17 1515  WBC 41.1* 30.8* 22.4* 21.3* 26.1* 24.7*  NEUTROABS 34.6* 25.6*  --   --  23.7*  --   HGB 4.7* 6.5* 9.5* 9.3* 9.7* 8.6*  HCT 13.7* 18.2* 26.6* 26.4* 27.9* 24.2*  MCV 80.1 83.5 86.6 86.8 86.4 88.6  PLT 575*  575* 438* 359 346 362 372   Cardiac Enzymes: No results for input(s): CKTOTAL, CKMB, CKMBINDEX, TROPONINI in the last 168 hours. BNP: Invalid input(s): POCBNP CBG:  Recent Labs Lab 03/07/17 1845 03/07/17 2320 03/08/17 0753 03/08/17 1412 03/08/17 1851  GLUCAP 88 205* 182* 136* 151*   D-Dimer  Recent Labs  03/06/17 2329  DDIMER 1.81*   Hgb A1c No results for input(s): HGBA1C in the last 72 hours. Lipid Profile No results for input(s): CHOL, HDL, LDLCALC, TRIG, CHOLHDL, LDLDIRECT in the last 72 hours. Thyroid function studies No results for input(s): TSH, T4TOTAL, T3FREE, THYROIDAB in the last 72 hours.  Invalid input(s): FREET3 Anemia work up  [employer]  03/07/17 0938 03/07/17 2131  FERRITIN 1,071*  --   TIBC 274  --   IRON 246*  --   RETICCTPCT 6.1* 4.2*   Microbiology Recent Results (from the past 240 hour(s))  Culture, blood (Routine X 2) w Reflex to ID Panel     Status: None (Preliminary result)   Collection Time: 03/07/17  5:30 AM  Result Value Ref Range Status   Specimen Description BLOOD RIGHT ARM  Final   Special Requests   Final    BOTTLES DRAWN AEROBIC AND ANAEROBIC Blood Culture adequate volume   Culture   Final    NO GROWTH 1 DAY Performed at [facility], [place] [address], [place], [place] [address]    Report Status PENDING  Incomplete  Culture, blood (Routine X 2) w Reflex to ID Panel     Status: None (Preliminary result)   Collection Time: 03/07/17  5:40 AM  Result Value Ref Range Status   Specimen Description BLOOD RIGHT ARM  Final   Special Requests   Final    BOTTLES DRAWN AEROBIC AND ANAEROBIC Blood Culture adequate volume   Culture   Final    NO GROWTH 1 DAY Performed at [facility], [place] [address] [place], [place] [address]    Report Status PENDING  Incomplete      Studies:  Ct Abdomen Pelvis W Contrast  Result Date: 03/07/2017 CLINICAL DATA:  History of hysterectomy, elevated white count low hemoglobin EXAM: CT ABDOMEN AND PELVIS WITH CONTRAST TECHNIQUE: Multidetector CT imaging of the abdomen and pelvis was performed using the standard protocol following bolus administration of intravenous contrast. CONTRAST:  160mL ISOVUE-300 IOPAMIDOL (ISOVUE-300) INJECTION 61%, 45mL ISOVUE-300 IOPAMIDOL (ISOVUE-300) INJECTION 61% COMPARISON:  Pelvic ultrasound 01/18/2017 FINDINGS: Lower chest: Coarse calcified left posterior subpleural nodule, measuring 13 mm. No pleural effusion. Borderline cardiomegaly. Trace pericardial effusion. Hepatobiliary: No focal liver abnormality is seen. No gallstones, gallbladder wall thickening, or biliary dilatation. Pancreas: Unremarkable. No pancreatic ductal  dilatation or surrounding inflammatory changes. Spleen: Vague hypodense mass with calcification in the medial aspect of the spleen measuring 4 by 3.6 cm. Adrenals/Urinary Tract: Adrenal glands are unremarkable. Kidneys are normal, without renal calculi, focal lesion, or hydronephrosis. Bladder is unremarkable. Stomach/Bowel: Stomach is within normal limits. Appendix consistent with history of appendectomy. No evidence of bowel wall thickening, distention,  or inflammatory changes. Vascular/Lymphatic: Non aneurysmal aorta. subcentimeter retroperitoneal nodes. Reproductive: Status post hysterectomy. Other: No free air. Gas and fluid collection between the bladder and vaginal cuff, measuring approximately 4.4 by 1 cm. In the right pelvis, there is a 3.2 cm hypodense lesion. Which presumably represents a cyst within the right ovary. Musculoskeletal: No acute or significant osseous findings. IMPRESSION: 1. Status post hysterectomy. Small irregular fluid collection containing small gas bubbles between the bladder and upper vaginal cuff, could represent resolving postop fluid collection however other consideration would be pelvic abscess. 2. 3.2 cm hypodensity in the right pelvis presumably represents a cyst in the right ovary. 3. Indeterminate 4 cm hypodense lesion in the posterior spleen. Recommend nonemergent MRI for further evaluation. 4. 13 mm coarsely calcified left lung base nodule could represent hamartoma or large granuloma Electronically Signed   By: Donavan Foil M.D.   On: 03/07/2017 03:01    Assessment: 51 y.o. 51 year old female with recent history of hysterectomy, presented with severe anemia and fatigue  1. 1. Autoimmune hemolytic anemia 2. Leukocytosis  3. Spleen lesion, rule out lymphoma  4. Pelvis fluid collection, ? abacess 5. Diabetes   Plan: -continue iv solumedrom today, D2, may switch to oral prednisone in 1-2 days -She still has very high total bilirubin with majority indirect  bilirubin, and persistent high LDH, indicating ongoing hemolysis  -Due to her down trending H/H this afternoon, I recommend to transfer her to [facility] hospital, hospitalist Dr. Maylene Roes has kindly accepted her. Dr. Talbert Nan agrees with the plan, I facilitated the transfer  -will continue monitoring CBC every 8 hours, and consider blood transfusion if Hb<7.0 -Will consider rituxan if she does not respond well to steroids in a few days, and may also consider splenectomy if needed -will speak IR to see if we can biopsy her inguinal lymph node to ruled out lymphoma -I will follow up her daily. Please call me for questions    Truitt Merle, MD 03/08/2017  8:01 PM Cell: [phone]

## 2017-03-08 NOTE — Progress Notes (Signed)
[facility] here to pick up pt. Report given.

## 2017-03-08 NOTE — Progress Notes (Signed)
Dr Burr Medico called & orders clarified. Order for CT Scan was erroneously placed & will be removed by MD.

## 2017-03-08 NOTE — Progress Notes (Signed)
Message left with RN for Dr Talbert Nan to call this RN when available re: order placed by Dr Burr Medico.

## 2017-03-09 DIAGNOSIS — R188 Other ascites: Secondary | ICD-10-CM

## 2017-03-09 DIAGNOSIS — I10 Essential (primary) hypertension: Secondary | ICD-10-CM

## 2017-03-09 DIAGNOSIS — E119 Type 2 diabetes mellitus without complications: Secondary | ICD-10-CM

## 2017-03-09 DIAGNOSIS — D591 Other autoimmune hemolytic anemias: Principal | ICD-10-CM

## 2017-03-09 DIAGNOSIS — E0590 Thyrotoxicosis, unspecified without thyrotoxic crisis or storm: Secondary | ICD-10-CM

## 2017-03-09 DIAGNOSIS — D649 Anemia, unspecified: Secondary | ICD-10-CM | POA: Insufficient documentation

## 2017-03-09 DIAGNOSIS — D598 Other acquired hemolytic anemias: Secondary | ICD-10-CM

## 2017-03-09 DIAGNOSIS — D72829 Elevated white blood cell count, unspecified: Secondary | ICD-10-CM

## 2017-03-09 DIAGNOSIS — D739 Disease of spleen, unspecified: Secondary | ICD-10-CM

## 2017-03-09 LAB — CBC
HEMATOCRIT: 23.7 % — AB (ref 36.0–46.0)
HEMATOCRIT: 24.6 % — AB (ref 36.0–46.0)
HEMATOCRIT: 25.2 % — AB (ref 36.0–46.0)
HEMOGLOBIN: 8.4 g/dL — AB (ref 12.0–15.0)
HEMOGLOBIN: 8.6 g/dL — AB (ref 12.0–15.0)
Hemoglobin: 8.7 g/dL — ABNORMAL LOW (ref 12.0–15.0)
MCH: 30.9 pg (ref 26.0–34.0)
MCH: 30.9 pg (ref 26.0–34.0)
MCH: 31.4 pg (ref 26.0–34.0)
MCHC: 34.5 g/dL (ref 30.0–36.0)
MCHC: 35 g/dL (ref 30.0–36.0)
MCHC: 35.4 g/dL (ref 30.0–36.0)
MCV: 87.1 fL (ref 78.0–100.0)
MCV: 88.5 fL (ref 78.0–100.0)
MCV: 91 fL (ref 78.0–100.0)
PLATELETS: 349 10*3/uL (ref 150–400)
Platelets: 329 10*3/uL (ref 150–400)
Platelets: 372 10*3/uL (ref 150–400)
RBC: 2.72 MIL/uL — AB (ref 3.87–5.11)
RBC: 2.77 MIL/uL — ABNORMAL LOW (ref 3.87–5.11)
RBC: 2.78 MIL/uL — ABNORMAL LOW (ref 3.87–5.11)
RDW: 16.1 % — AB (ref 11.5–15.5)
RDW: 16.2 % — ABNORMAL HIGH (ref 11.5–15.5)
RDW: 16.9 % — AB (ref 11.5–15.5)
WBC: 20.5 10*3/uL — AB (ref 4.0–10.5)
WBC: 21.2 10*3/uL — ABNORMAL HIGH (ref 4.0–10.5)
WBC: 21.6 10*3/uL — AB (ref 4.0–10.5)

## 2017-03-09 LAB — COMPREHENSIVE METABOLIC PANEL
ALBUMIN: 3.7 g/dL (ref 3.5–5.0)
ALK PHOS: 56 U/L (ref 38–126)
ALT: 101 U/L — ABNORMAL HIGH (ref 14–54)
ANION GAP: 10 (ref 5–15)
AST: 33 U/L (ref 15–41)
BILIRUBIN TOTAL: 2.3 mg/dL — AB (ref 0.3–1.2)
BUN: 21 mg/dL — AB (ref 6–20)
CALCIUM: 9.3 mg/dL (ref 8.9–10.3)
CO2: 25 mmol/L (ref 22–32)
Chloride: 106 mmol/L (ref 101–111)
Creatinine, Ser: 0.73 mg/dL (ref 0.44–1.00)
GFR calc Af Amer: >60 mL/min (ref >60)
GLUCOSE: 203 mg/dL — AB (ref 65–99)
Potassium: 3.6 mmol/L (ref 3.5–5.1)
Sodium: 141 mmol/L (ref 135–145)
TOTAL PROTEIN: 6.9 g/dL (ref 6.5–8.1)

## 2017-03-09 LAB — BASIC METABOLIC PANEL
ANION GAP: 8 (ref 5–15)
BUN: 22 mg/dL — AB (ref 6–20)
CHLORIDE: 107 mmol/L (ref 101–111)
CO2: 24 mmol/L (ref 22–32)
Calcium: 9 mg/dL (ref 8.9–10.3)
Creatinine, Ser: 0.68 mg/dL (ref 0.44–1.00)
Glucose, Bld: 253 mg/dL — ABNORMAL HIGH (ref 65–99)
POTASSIUM: 3.7 mmol/L (ref 3.5–5.1)
SODIUM: 139 mmol/L (ref 135–145)

## 2017-03-09 LAB — ABO/RH: ABO/RH(D): A POS

## 2017-03-09 LAB — GLUCOSE, CAPILLARY
Glucose-Capillary: 191 mg/dL — ABNORMAL HIGH (ref 65–99)
Glucose-Capillary: 185 mg/dL — ABNORMAL HIGH (ref 65–99)
Glucose-Capillary: 119 mg/dL — ABNORMAL HIGH (ref 65–99)
Glucose-Capillary: 183 mg/dL — ABNORMAL HIGH (ref 65–99)

## 2017-03-09 LAB — TYPE AND SCREEN
ABO/RH(D): A POS
Antibody Screen: NEGATIVE

## 2017-03-09 LAB — LACTATE DEHYDROGENASE: LDH: 383 U/L — AB (ref 98–192)

## 2017-03-09 LAB — HEMOGLOBINOPATHY EVALUATION
HGB S QUANTITAION: 0 %
Hgb A2 Quant: 2.4 % (ref 1.8–3.2)
Hgb A: 97.6 % (ref 96.4–98.8)
Hgb C: 0 %
Hgb F Quant: 0 % (ref 0.0–2.0)
Hgb Variant: 0 %

## 2017-03-09 LAB — T4, FREE: Free T4: 1.03 ng/dL (ref 0.61–1.12)

## 2017-03-09 LAB — TSH: TSH: 0.202 u[IU]/mL — ABNORMAL LOW (ref 0.350–4.500)

## 2017-03-09 MED ORDER — INSULIN ASPART 100 UNIT/ML ~~LOC~~ SOLN: 0.0000 [IU] | Freq: Every day | SUBCUTANEOUS | Status: DC

## 2017-03-09 MED ORDER — SODIUM CHLORIDE 0.9 % IV SOLN
1000.0000 mg | INTRAVENOUS | Status: DC
  Filled 2017-03-09: qty 8

## 2017-03-09 MED ORDER — ONDANSETRON HCL 4 MG/2ML IJ SOLN: 4.0000 mg | Freq: Four times a day (QID) | INTRAMUSCULAR | Status: DC | PRN

## 2017-03-09 MED ORDER — DOCUSATE SODIUM 100 MG PO CAPS
100.0000 mg | ORAL_CAPSULE | Freq: Every day | ORAL | Status: DC
  Administered 2017-03-09 – 2017-03-10 (×2): 100 mg via ORAL
  Filled 2017-03-09 (×2): qty 1

## 2017-03-09 MED ORDER — HYDROCHLOROTHIAZIDE 25 MG PO TABS
25.0000 mg | ORAL_TABLET | Freq: Every day | ORAL | Status: DC
  Administered 2017-03-09 – 2017-03-10 (×2): 25 mg via ORAL
  Filled 2017-03-09 (×2): qty 1

## 2017-03-09 MED ORDER — INSULIN ASPART 100 UNIT/ML ~~LOC~~ SOLN
0.0000 [IU] | Freq: Three times a day (TID) | SUBCUTANEOUS | Status: DC
  Administered 2017-03-09 – 2017-03-10 (×3): 3 [IU] via SUBCUTANEOUS

## 2017-03-09 MED ORDER — OXYCODONE-ACETAMINOPHEN 5-325 MG PO TABS: 1.0000 | ORAL_TABLET | ORAL | Status: DC | PRN

## 2017-03-09 MED ORDER — AMOXICILLIN-POT CLAVULANATE 875-125 MG PO TABS
1.0000 | ORAL_TABLET | Freq: Two times a day (BID) | ORAL | Status: DC
  Administered 2017-03-09 – 2017-03-10 (×3): 1 via ORAL
  Filled 2017-03-09 (×3): qty 1

## 2017-03-09 MED ORDER — POTASSIUM CHLORIDE CRYS ER 20 MEQ PO TBCR
20.0000 meq | EXTENDED_RELEASE_TABLET | Freq: Every day | ORAL | Status: DC
  Administered 2017-03-09 – 2017-03-10 (×2): 20 meq via ORAL
  Filled 2017-03-09 (×2): qty 1

## 2017-03-09 MED ORDER — PREDNISONE 20 MG PO TABS
80.0000 mg | ORAL_TABLET | Freq: Every day | ORAL | Status: DC
  Administered 2017-03-09 – 2017-03-10 (×2): 80 mg via ORAL
  Filled 2017-03-09 (×2): qty 4

## 2017-03-09 MED ORDER — ACETAMINOPHEN 650 MG RE SUPP: 650.0000 mg | Freq: Four times a day (QID) | RECTAL | Status: DC | PRN

## 2017-03-09 MED ORDER — ONDANSETRON HCL 4 MG PO TABS: 4.0000 mg | ORAL_TABLET | Freq: Four times a day (QID) | ORAL | Status: DC | PRN

## 2017-03-09 MED ORDER — ACETAMINOPHEN 325 MG PO TABS: 650.0000 mg | ORAL_TABLET | Freq: Four times a day (QID) | ORAL | Status: DC | PRN

## 2017-03-09 NOTE — Progress Notes (Signed)
Michelle Castillo   DOB:[birth year]   XT#:[id number]   XKP#:[id number]  Hematology follow up   Subjective: Pt feels well, no new complains. H/H has been stable since yesterday afternoon, Hb around 8.6. She noticed her urine color is slightly lighter.  LDH trending down    Objective:  Vitals:   03/09/17 1445 03/09/17 2100  BP: 140/79 133/77  Pulse: 87 90  Resp: 18 18  Temp: 99.5 F (37.5 C) 98.6 F (37 C)    Body mass index is 26.15 kg/m.  Intake/Output Summary (Last 24 hours) at 03/09/17 2322 Last data filed at 03/09/17 2135  Gross per 24 hour  Intake              300 ml  Output             1100 ml  Net             -800 ml     Sclerae unicteric  Oropharynx clear  No peripheral adenopathy  Lungs clear -- no rales or rhonchi  Heart regular rate and rhythm  Abdomen benign  MSK no focal spinal tenderness, no peripheral edema  Neuro nonfocal   CBG (last 3)   Recent Labs  03/09/17 1156 03/09/17 1609 03/09/17 2132  GLUCAP 185* 119* 183*     Labs:  Lab Results  Component Value Date   WBC 21.6 (H) 03/09/2017   HGB 8.7 (L) 03/09/2017   HCT 25.2 (L) 03/09/2017   MCV 91.0 03/09/2017   PLT 349 03/09/2017   NEUTROABS 23.7 (H) 03/08/2017   CMP Latest Ref Rng & Units 03/09/2017 03/09/2017 03/08/2017  Glucose 65 - 99 mg/dL 203(H) 253(H) 205(H)  BUN 6 - 20 mg/dL 21(H) 22(H) 27(H)  Creatinine 0.44 - 1.00 mg/dL 0.73 0.68 0.73  Sodium 135 - 145 mmol/L 141 139 138  Potassium 3.5 - 5.1 mmol/L 3.6 3.7 3.6  Chloride 101 - 111 mmol/L 106 107 105  CO2 22 - 32 mmol/L 25 24 24   Calcium 8.9 - 10.3 mg/dL 9.3 9.0 9.5  Total Protein 6.5 - 8.1 g/dL 6.9 - 7.7  Total Bilirubin 0.3 - 1.2 mg/dL 2.3(H) - 12.8(H)  Alkaline Phos 38 - 126 U/L 56 - 64  AST 15 - 41 U/L 33 - 91(H)  ALT 14 - 54 U/L 101(H) - 160(H)     Urine Studies No results for input(s): UHGB, CRYS in the last 72 hours.  Invalid input(s): UACOL, UAPR, USPG, UPH, UTP, UGL, UKET, UBIL, UNIT, UROB, [place], UEPI, UWBC, Duwayne Heck [place], [place]  Basic Metabolic Panel:  Recent Labs Lab 03/07/17 [phone] 03/07/17 2131 03/08/17 0546 03/09/17 0054 03/09/17 0635  NA 138 137 138 139 141  K 3.4* 3.8 3.6 3.7 3.6  CL 107 105 105 107 106  CO2 26 25 24 24 25   GLUCOSE 135* 209* 205* 253* 203*  BUN 23* 27* 27* 22* 21*  CREATININE 0.58 0.61 0.73 0.68 0.73  CALCIUM 9.1 9.1 9.5 9.0 9.3   GFR Estimated Creatinine Clearance: 88.2 mL/min (by C-G formula based on SCr of 0.73 mg/dL). Liver Function Tests:  Recent Labs Lab 03/06/17 2010 03/07/17 0938 03/07/17 2131 03/08/17 0546 03/09/17 0635  AST 153* 179* 142* 91* 33  ALT 159* 148* 162* 160* 101*  ALKPHOS 57 51 55 64 56  BILITOT 8.0* 11.3* 16.2* 12.8* 2.3*  PROT 7.6 6.5 7.2 7.7 6.9  ALBUMIN 4.4 3.6 3.9 4.2 3.7   No results for input(s): LIPASE, AMYLASE in the last 168  hours. No results for input(s): AMMONIA in the last 168 hours. Coagulation profile  Recent Labs Lab 03/06/17 2329  INR 1.23    CBC:  Recent Labs Lab 03/06/17 2329 03/07/17 0819  03/08/17 0546 03/08/17 1515 03/09/17 0054 03/09/17 0635 03/09/17 1600  WBC 41.1* 30.8*  < > 26.1* 24.7* 20.5* 21.2* 21.6*  NEUTROABS 34.6* 25.6*  --  23.7*  --   --   --   --   HGB 4.7* 6.5*  < > 9.7* 8.6* 8.4* 8.6* 8.7*  HCT 13.7* 18.2*  < > 27.9* 24.2* 23.7* 24.6* 25.2*  MCV 80.1 83.5  < > 86.4 88.6 87.1 88.5 91.0  PLT 575*  575* 438*  < > 362 372 329 372 349  < > = values in this interval not displayed. Cardiac Enzymes: No results for input(s): CKTOTAL, CKMB, CKMBINDEX, TROPONINI in the last 168 hours. BNP: Invalid input(s): POCBNP CBG:  Recent Labs Lab 03/08/17 2138 03/09/17 0800 03/09/17 1156 03/09/17 1609 03/09/17 2132  GLUCAP 188* 191* 185* 119* 183*   D-Dimer  Recent Labs  03/06/17 2329  DDIMER 1.81*   Hgb A1c No results for input(s): HGBA1C in the last 72 hours. Lipid Profile No results for input(s): CHOL, HDL, LDLCALC, TRIG, CHOLHDL, LDLDIRECT in the last 72 hours. Thyroid  function studies  Recent Labs  03/09/17 0631  TSH 0.202*   Anemia work up  Recent Labs  03/07/17 0938 03/07/17 2131  FERRITIN 1,071*  --   TIBC 274  --   IRON 246*  --   RETICCTPCT 6.1* 4.2*   Microbiology Recent Results (from the past 240 hour(s))  Culture, blood (Routine X 2) w Reflex to ID Panel     Status: None (Preliminary result)   Collection Time: 03/07/17  5:30 AM  Result Value Ref Range Status   Specimen Description BLOOD RIGHT ARM  Final   Special Requests   Final    BOTTLES DRAWN AEROBIC AND ANAEROBIC Blood Culture adequate volume   Culture   Final    NO GROWTH 2 DAYS Performed at [facility], [place] [address] [place], [place] [address]    Report Status PENDING  Incomplete  Culture, blood (Routine X 2) w Reflex to ID Panel     Status: None (Preliminary result)   Collection Time: 03/07/17  5:40 AM  Result Value Ref Range Status   Specimen Description BLOOD RIGHT ARM  Final   Special Requests   Final    BOTTLES DRAWN AEROBIC AND ANAEROBIC Blood Culture adequate volume   Culture   Final    NO GROWTH 2 DAYS Performed at [facility], [place] [address] [place], [place] [address]    Report Status PENDING  Incomplete      Studies:  No results found.  Assessment: 51 y.o. 51 year old female with recent history of hysterectomy, presented with severe anemia 49 and fatigue  1. 1. Autoimmune hemolytic anemia 2. Leukocytosis  3. Spleen lesion, rule out lymphoma  4. Pelvis fluid collection, ? abacess 5. Diabetes   Plan: -Her hemoglobin has been stable, LDH trending down, total bilirubin has significantly dropped from 12.8 to 2.3 today, indicating improving of her hemoptysis.  -I'll change her salmeterol to oral prednisone 80 mg daily, she received first dose around 6pm today -if her CBC stable, LDH and tbil continue improving, then I would hold on Rituxan or splenectomy, possible discharge tomorrow  -will follow up tomorrow   Truitt Merle, MD 03/09/2017   11:22 PM Cell:  [phone]  

## 2017-03-09 NOTE — Progress Notes (Signed)
Subjective: Patient reports she is feeling okay, fatigue is better. No pain, normal BM and voiding normally.   Objective: I have reviewed patient's vital signs, intake and output and labs.  General: alert, cooperative, icteric and no distress Resp: clear to auscultation bilaterally Cardio: regular rate and rhythm, S1, S2 normal, no murmur, click, rub or gallop GI: soft, non-tender; bowel sounds normal; no masses,  no organomegaly Extremities: extremities normal, atraumatic, no cyanosis or edema   Today's Vitals   03/09/17 0012 03/09/17 0100 03/09/17 0137 03/09/17 0526  BP:    (!) 147/82  Pulse:    93  Resp:    18  Temp:    98.4 F (36.9 C)  TempSrc:    Oral  SpO2:    100%  Weight:  162 lb 0.6 oz (73.5 kg)    Height:  5\' 6"  (1.676 m)    PainSc: 0-No pain  Asleep     Lab Results  Component Value Date   WBC 21.2 (H) 03/09/2017   HGB 8.6 (L) 03/09/2017   HCT 24.6 (L) 03/09/2017   MCV 88.5 03/09/2017   PLT 372 03/09/2017      Assessment/Plan: POD #15 s/p TLH/LSO/RS/LOA for fibroid uterus and stage 4 endometriosis. She was admitted to [facility] hospital Sunday night with a Hgb of 4.7 and a WBC of 41.  1)Hemolytic anemia, s/p 4 units of PRBC, on steroids. Hgb went down slightly last night, stable this morning. Plan per hematology 2)Possible pelvic abscess, CT scan with a 4.4 x 1 cm collection at the vaginal cuff. The patient was started on Zosyn on admission given her CT scan and elevated WBC. She did not present in the typical fashion with pain and fevers. Her WBC has come down since she was on antibiotics.  -Her IV infiltrated last night, she only finished 1/2 of her Zosyn dose yesterday evening (started at 1600)  -I would recommend continuing her Zosyn, then changing to oral antibiotics (Augmentin) to complete 14 days of antibiotics. Typically you would stop her IV antibiotics when she shows clinical improvement, WBC is improving and abscess is shrinking on CT. Given that she has  never had a fever or abdominal pain, the plan to change her to oral antibiotics is more complicated. A CT could be done, or given her improving WBC and benign exam she could be switched to oral antibiotics  3)AODM, typically controlled on metformin, currently on sliding scale 4)HTN, she was not needing her antihypertensives at [facility], BP increased some this am. Per hospitalist note meds have been restarted 5)Elevated LFT's, improved on lab work this morning  I will contact Dr Rockne Menghini to discuss her antibiotics.   LOS: 1 day    Salvadore Dom, MD 03/09/2017, 8:01 AM

## 2017-03-09 NOTE — Progress Notes (Signed)
Progress Note    Michelle Castillo  CHE:[id number] DOB: [birth year]  DOA: 03/08/2017 PCP: Renato Shin, MD    Brief Narrative:   Chief complaint: Follow-up anemia  Medical records reviewed and are as summarized below:  Michelle Castillo is an 51 y.o. female with a PMH of diabetes, hyperlipidemia, hypertension, hyperthyroidism, symptomatic anemia status post hysterectomy who was admitted to [facility] on 03/06/17 for treatment of recurrent anemia status post 4 units of PRBCs, subsequently transferred to [facility] upon the request of her hematologist due to what appears to be autoimmune hemolytic anemia. In addition, she appears to have a possible pelvic abscess as well as a splenic lesion that needs further evaluation. She has been placed on Solu-Medrol but has had high bilirubin and LDH indicating ongoing hemolysis.  Assessment/Plan:   Principal Problem:   Autoimmune hemolytic anemia ([place]) Being managed by hematologist. Continue frequent CBC checks. Transfuse PRBCs for hemoglobin less than 7. Hemoglobin currently stable at 8.6 mg/dL after 4 units of PRBCs. Lost IV access. Dr. Burr Medico notified, and will likely switch to oral prednisone.  Active Problems:   Abnormal CT scan findings concerning for pelvic abscess/splenic lesion WBC is elevated however she is afebrile. May need an MRI for further evaluation. Awaiting decision concerning need for splenectomy before proceeding. IR consulted for possible inguinal lymph node biopsy. Discussed care with Dr. Terance Hart who recommended keeping the patient on antibiotics, so Augmentin began. 14 days of therapy recommended.    Hyperthyroidism Thyroid function studies repeated. TSH suppressed, but free T4 within normal limits.    Diabetes ([place]) Metformin on hold, currently being managed with insulin sensitive SSI. Appears to be, in part, steroid induced. Recent hemoglobin A1c was less than 6. CBGs 136-191.    Essential hypertension Continue HCTZ.  HIV  screening The patient falls between the ages of 13-64 and should be screened for HIV, Done 06/28/16: Nonreactive.  Family Communication/Anticipated D/C date and plan/Code Status   DVT prophylaxis: SCDs ordered. Code Status: Full Code.  Family Communication: Visitor sleep at the bedside. Disposition Plan: Home when cleared by hematologist.   Medical Consultants:    Hematology  Gynecology   Anti-Infectives:    Augmentin 03/09/17--->  Subjective:   No complaints.Feels well. Review of systems negative for pain, nausea, vomiting, shortness of breath. Fatigue has improved.  Objective:    Vitals:   03/08/17 2139 03/09/17 0100 03/09/17 0526  BP: 137/90  (!) 147/82  Pulse: 86  93  Resp: 18  18  Temp: 98.7 F (37.1 C)  98.4 F (36.9 C)  TempSrc: Oral  Oral  SpO2: 100%  100%  Weight:  73.5 kg (162 lb 0.6 oz)   Height:  5\' 6"  (1.676 m)     Intake/Output Summary (Last 24 hours) at 03/09/17 0804 Last data filed at 03/09/17 5361  Gross per 24 hour  Intake                0 ml  Output              300 ml  Net             -300 ml   Filed Weights   03/09/17 0100  Weight: 73.5 kg (162 lb 0.6 oz)    Exam: General exam: No distress, looks her stated age. Respiratory system: Lungs diminished in the bases. Cardiovascular system: Heart sounds mildly tachycardic. No JVD. Gastrointestinal system: Abdomen is soft, non-tender. Central nervous system: Alert and oriented 3. Nonfocal.  Extremities: No clubbing, edema, or cyanosis. Skin: No rashes. Skin warm and dry. Icteric skin. Psychiatry: Mood and affect normal.  Data Reviewed:   I have personally reviewed following labs and imaging studies:  Labs: Labs show the following: Glucose elevated at 253. BUN 22, creatinine 0.68. AST 91, ALT 160, total bilirubin 12.8. WBC 21.2, hemoglobin 8.6, platelets 372. LDH 383, down from 618. TSH 0.202, free T4 normal at 1.03.  Blood cultures negative to date.  Procedures and diagnostic  studies:  No results found.  Medications:   . docusate sodium  100 mg Oral Daily  . hydrochlorothiazide  25 mg Oral Daily  . insulin aspart  0-15 Units Subcutaneous TID WC  . insulin aspart  0-5 Units Subcutaneous QHS  . potassium chloride SA  20 mEq Oral Daily   Continuous Infusions: . methylPREDNISolone (SOLU-MEDROL) injection     Medical decision making is moderately complex therefore this is a level 2 visit. (> 3 problem points, >3 data points, moderate risk: Need 2 out of 3)    Problems/DDx Points   Self limited or minor (max 2)       1   Established problem, stable       1  2  Established problem, worsening       2   New problem, no additional W/U planned (max 1)       3   New problem, additional W/U planned        4  4   Data Reviewed Points   Review/order clinical lab tests       1  1  Review/order x-rays       1   Review/order tests (Echo, EKG, PFTs, etc)       1   Discussion of test results w/ performing MD       1    Independent review of image, tracing or specimen       2   Decision to obtain old records       1   Review and summation of old records       2  2   Level of risk Presenting prob Diagnostics Management   Minimal 1 self limited/minor Labs CXR EKG/EEG U/A U/S Rest Gargles Bandages Dressings   Low 2 or more self limited/minor 1 stable chronic Acute uncomplicated illness Tests (PFTS) Non-CV imaging Arterial labs Biopsies of skin OTC drugs Minor surgery-no risk PT OT IVF without additives    Moderate 1 or more chronic illnesses w/ mild exac, progression or S/E from tx 2 or more stable chronic illnesses Undiagnosed new problem w/ uncertain prognosis Acute complicated injury  Stress tests Endoscopies with no risk factors Deep needle or incisional bx CV imaging without risk LP Thoracentesis Paracentesis Minor surgery w/ risks Elective major surgery w/ no risk (open, percutaneous or endoscopic) Prescription drugs Therapeutic nucl  med IVF with additives Closed tx of fracture/dislocation    High Severe exac of chronic illness Acute or chronic illness/injury may pose a threat to life or bodily function (ARF) Change in neuro status    CV imaging w/ contrast and risk Cardio electophysiologic tests Endoscopies w/ risk Discography Elective major surgery Emergency major surgery Parenteral controlled substances Drug therapy req monitoring for toxicity DNR/de-escalation of care    MDM Prob points Data points Risk   Straightforward    <1    <1    Min   Low complexity    2    2  Low   Moderate    3    3    Mod   High Complexity    4 or more    4 or more    High      LOS: 1 day   RAMA,CHRISTINA  [facility] Pager [phone]. If unable to reach me by pager, please call my cell phone at [phone].  *Please refer to [email or web address], password [email or web address] to get updated schedule on who will round on this patient, as hospitalists switch teams weekly. If 7PM-7AM, please contact night-coverage at [email or web address], password [email or web address] for any overnight needs.  03/09/2017, 8:04 AM

## 2017-03-10 ENCOUNTER — Telehealth: Payer: Self-pay | Admitting: Obstetrics and Gynecology

## 2017-03-10 LAB — CBC
HCT: 25.5 % — ABNORMAL LOW (ref 36.0–46.0)
HCT: 26.7 % — ABNORMAL LOW (ref 36.0–46.0)
HEMOGLOBIN: 8.8 g/dL — AB (ref 12.0–15.0)
HEMOGLOBIN: 9.2 g/dL — AB (ref 12.0–15.0)
MCH: 31.4 pg (ref 26.0–34.0)
MCH: 30.9 pg (ref 26.0–34.0)
MCHC: 34.5 g/dL (ref 30.0–36.0)
MCHC: 34.5 g/dL (ref 30.0–36.0)
MCV: 91.1 fL (ref 78.0–100.0)
MCV: 89.5 fL (ref 78.0–100.0)
PLATELETS: 362 10*3/uL (ref 150–400)
Platelets: 324 10*3/uL (ref 150–400)
RBC: 2.93 MIL/uL — ABNORMAL LOW (ref 3.87–5.11)
RBC: 2.85 MIL/uL — AB (ref 3.87–5.11)
RDW: 17.1 % — ABNORMAL HIGH (ref 11.5–15.5)
RDW: 17.2 % — AB (ref 11.5–15.5)
WBC: 15.9 10*3/uL — ABNORMAL HIGH (ref 4.0–10.5)
WBC: 18.9 10*3/uL — ABNORMAL HIGH (ref 4.0–10.5)

## 2017-03-10 LAB — BILIRUBIN, FRACTIONATED(TOT/DIR/INDIR)
BILIRUBIN DIRECT: 0.3 mg/dL (ref 0.1–0.5)
BILIRUBIN INDIRECT: 0.9 mg/dL (ref 0.3–0.9)
BILIRUBIN TOTAL: 1.2 mg/dL (ref 0.3–1.2)

## 2017-03-10 LAB — LACTATE DEHYDROGENASE: LDH: 294 U/L — AB (ref 98–192)

## 2017-03-10 LAB — GLUCOSE, CAPILLARY: GLUCOSE-CAPILLARY: 155 mg/dL — AB (ref 65–99)

## 2017-03-10 MED ORDER — PANTOPRAZOLE SODIUM 40 MG PO TBEC: 40.0000 mg | DELAYED_RELEASE_TABLET | Freq: Every day | ORAL | 1 refills | Status: DC

## 2017-03-10 MED ORDER — PREDNISONE 20 MG PO TABS: 80.0000 mg | ORAL_TABLET | Freq: Every day | ORAL | 0 refills | Status: DC

## 2017-03-10 MED ORDER — AMOXICILLIN-POT CLAVULANATE 875-125 MG PO TABS: 1.0000 | ORAL_TABLET | Freq: Two times a day (BID) | ORAL | 0 refills | Status: DC

## 2017-03-10 NOTE — Telephone Encounter (Signed)
The patient was d/c'd from the hospital today. Is feeling okay. She went home with antibiotics and steroids. She will f/u with heme in the next few days. She will f/u here early next week.

## 2017-03-10 NOTE — Discharge Summary (Signed)
Physician Discharge Summary  Michelle Castillo ASN:[id number] DOB: Jul 22, 1966 DOA: 03/08/2017  PCP: Renato Shin, MD  Admit date: 03/08/2017 Discharge date: 03/10/2017  Admitted From: Home Discharge disposition: Home   Recommendations for Outpatient Follow-Up:   1. The patient will F/U with Dr. Burr Medico next week for repeat blood work.   Discharge Diagnosis:   Principal Problem:    Autoimmune hemolytic anemia (HCC) Active Problems:    Hyperthyroidism    Diabetes ([place])    Essential hypertension    Fluid collection in pelvis worrisome for infection  Discharge Condition: Improved.  Diet recommendation:  Carbohydrate-modified.    History of Present Illness:   Michelle Castillo is an 51 y.o. female with a PMH of diabetes, hyperlipidemia, hypertension, hyperthyroidism, symptomatic anemia status post hysterectomy who was admitted to [facility] on 03/06/17 for treatment of recurrent anemia status post 4 units of PRBCs, subsequently transferred to [facility] upon the request of her hematologist due to what appears to be autoimmune hemolytic anemia. In addition, she appears to have a possible pelvic abscess as well as a splenic lesion that needs further evaluation. She has been placed on Solu-Medrol but has had high bilirubin and LDH indicating ongoing hemolysis.  Hospital Course by Problem:   Principal Problem:   Autoimmune hemolytic anemia (HCC)  Hemoglobin currently stable at 9.2 mg/dL after 4 units of PRBCs. Intially treated with high dose Solumedrol, switched to PO prednisone 03/09/17. LDH/bilirubin improved. Added Protonix for GI prophylaxis, F/U with Dr. Burr Medico.  Active Problems:   Abnormal CT scan findings concerning for pelvic abscess/splenic lesion WBC is elevated however she is afebrile. May need an MRI for further evaluation but no immediate indication. Discussed care with Dr. Terance Hart who recommended keeping the patient on antibiotics, so Augmentin began. 14 days of therapy  recommended which will be completed 03/20/17.    Hyperthyroidism Thyroid function studies repeated. TSH suppressed, but free T4 within normal limits.    Diabetes ([place]) Metformin on hold, currently being managed with insulin sensitive SSI. Appears to be, in part, steroid induced. Recent hemoglobin A1c was less than 6. Resume metformin at D/C.    Essential hypertension Continue HCTZ.  HIV screening The patient falls between the ages of 13-64 and should be screened for HIV, Done 06/28/16: Nonreactive.  Medical Consultants:    Oncology  OB-GYN   Discharge Exam:   Vitals:   03/09/17 2100 03/10/17 0300  BP: 133/77 (!) 154/92  Pulse: 90 77  Resp: 18 18  Temp: 98.6 F (37 C) 98.4 F (36.9 C)   Vitals:   03/09/17 0526 03/09/17 1445 03/09/17 2100 03/10/17 0300  BP: (!) 147/82 140/79 133/77 (!) 154/92  Pulse: 93 87 90 77  Resp: 18 18 18 18   Temp: 98.4 F (36.9 C) 99.5 F (37.5 C) 98.6 F (37 C) 98.4 F (36.9 C)  TempSrc: Oral Oral Oral Oral  SpO2: 100% 100% 100% 100%  Weight:      Height:        General exam: Appears calm and comfortable.  Respiratory system: Clear to auscultation. Respiratory effort normal. Cardiovascular system: S1 & S2 heard, RRR. No JVD,  rubs, gallops or clicks. No murmurs. Gastrointestinal system: Abdomen is nondistended, soft and nontender. No organomegaly or masses felt. Normal bowel sounds heard. Central nervous system: Alert and oriented. No focal neurological deficits. Extremities: No clubbing,  or cyanosis. No edema. Skin: No rashes, lesions or ulcers. Icterus resolved. Psychiatry: Judgement and insight appear normal. Mood & affect appropriate.  The results of significant diagnostics from this hospitalization (including imaging, microbiology, ancillary and laboratory) are listed below for reference.     Procedures and Diagnostic Studies:   No results found.   Labs:   Basic Metabolic Panel:  Recent Labs Lab 03/07/17 0938  03/07/17 2131 03/08/17 0546 03/09/17 0054 03/09/17 0635  NA 138 137 138 139 141  K 3.4* 3.8 3.6 3.7 3.6  CL 107 105 105 107 106  CO2 26 25 24 24 25   GLUCOSE 135* 209* 205* 253* 203*  BUN 23* 27* 27* 22* 21*  CREATININE 0.58 0.61 0.73 0.68 0.73  CALCIUM 9.1 9.1 9.5 9.0 9.3   GFR Estimated Creatinine Clearance: 88.2 mL/min (by C-G formula based on SCr of 0.73 mg/dL). Liver Function Tests:  Recent Labs Lab 03/06/17 2010 03/07/17 0240 03/07/17 2131 03/08/17 0546 03/09/17 0635 03/10/17 0436  AST 153* 179* 142* 91* 33  --   ALT 159* 148* 162* 160* 101*  --   ALKPHOS 57 51 55 64 56  --   BILITOT 8.0* 11.3* 16.2* 12.8* 2.3* 1.2  PROT 7.6 6.5 7.2 7.7 6.9  --   ALBUMIN 4.4 3.6 3.9 4.2 3.7  --    No results for input(s): LIPASE, AMYLASE in the last 168 hours. No results for input(s): AMMONIA in the last 168 hours. Coagulation profile  Recent Labs Lab 03/06/17 2329  INR 1.23    CBC:  Recent Labs Lab 03/06/17 2329 03/07/17 0819  03/08/17 0546  03/09/17 0054 03/09/17 0635 03/09/17 1600 03/10/17 0003 03/10/17 0850  WBC 41.1* 30.8*  < > 26.1*  < > 20.5* 21.2* 21.6* 15.9* 18.9*  NEUTROABS 34.6* 25.6*  --  23.7*  --   --   --   --   --   --   HGB 4.7* 6.5*  < > 9.7*  < > 8.4* 8.6* 8.7* 8.8* 9.2*  HCT 13.7* 18.2*  < > 27.9*  < > 23.7* 24.6* 25.2* 25.5* 26.7*  MCV 80.1 83.5  < > 86.4  < > 87.1 88.5 91.0 89.5 91.1  PLT 575*  575* 438*  < > 362  < > 329 372 349 324 362  < > = values in this interval not displayed. Cardiac Enzymes: No results for input(s): CKTOTAL, CKMB, CKMBINDEX, TROPONINI in the last 168 hours. BNP: Invalid input(s): POCBNP CBG:  Recent Labs Lab 03/09/17 0800 03/09/17 1156 03/09/17 1609 03/09/17 2132 03/10/17 0821  GLUCAP 191* 185* 119* 183* 155*   D-Dimer No results for input(s): DDIMER in the last 72 hours. Hgb A1c No results for input(s): HGBA1C in the last 72 hours. Lipid Profile No results for input(s): CHOL, HDL, LDLCALC, TRIG,  CHOLHDL, LDLDIRECT in the last 72 hours. Thyroid function studies  Recent Labs  03/09/17 0631  TSH 0.202*   Anemia work up  Recent Labs  03/07/17 2131  RETICCTPCT 4.2*   Microbiology Recent Results (from the past 240 hour(s))  Culture, blood (Routine X 2) w Reflex to ID Panel     Status: None (Preliminary result)   Collection Time: 03/07/17  5:30 AM  Result Value Ref Range Status   Specimen Description BLOOD RIGHT ARM  Final   Special Requests   Final    BOTTLES DRAWN AEROBIC AND ANAEROBIC Blood Culture adequate volume   Culture   Final    NO GROWTH 2 DAYS Performed at [facility], [address] [place], [place] [address]    Report Status PENDING  Incomplete  Culture,  blood (Routine X 2) w Reflex to ID Panel     Status: None (Preliminary result)   Collection Time: 03/07/17  5:40 AM  Result Value Ref Range Status   Specimen Description BLOOD RIGHT ARM  Final   Special Requests   Final    BOTTLES DRAWN AEROBIC AND ANAEROBIC Blood Culture adequate volume   Culture   Final    NO GROWTH 2 DAYS Performed at [facility], [address] [place], [place] [address]    Report Status PENDING  Incomplete     Discharge Instructions:   Discharge Instructions    Call MD for:  difficulty breathing, headache or visual disturbances    Complete by:  As directed    Call MD for:  extreme fatigue    Complete by:  As directed    Call MD for:  persistant dizziness or light-headedness    Complete by:  As directed    Call MD for:  persistant nausea and vomiting    Complete by:  As directed    Call MD for:  severe uncontrolled pain    Complete by:  As directed    Call MD for:  temperature >100.4    Complete by:  As directed    Diet Carb Modified    Complete by:  As directed    Increase activity slowly    Complete by:  As directed      Allergies as of 03/10/2017   No Known Allergies     Medication List    STOP taking these medications   ibuprofen 800 MG  tablet Commonly known as:  ADVIL,MOTRIN     TAKE these medications   amoxicillin-clavulanate 875-125 MG tablet Commonly known as:  AUGMENTIN Take 1 tablet by mouth every 12 (twelve) hours.   docusate sodium 100 MG capsule Commonly known as:  COLACE Take 1 capsule (100 mg total) by mouth 2 (two) times daily. What changed:  when to take this   hydrochlorothiazide 25 MG tablet Commonly known as:  HYDRODIURIL Take 1 tablet (25 mg total) by mouth daily. for high blood pressure   metFORMIN 500 MG 24 hr tablet Commonly known as:  GLUCOPHAGE-XR TAKE (2) TABLETS TWICE DAILY. What changed:  how much to take  how to take this  when to take this  additional instructions   oxyCODONE-acetaminophen 5-325 MG tablet Commonly known as:  PERCOCET/ROXICET Take 1-2 tablets by mouth every 4 (four) hours as needed (moderate to severe pain (when tolerating fluids)).   pantoprazole 40 MG tablet Commonly known as:  PROTONIX Take 1 tablet (40 mg total) by mouth daily.   potassium chloride SA 20 MEQ tablet Commonly known as:  K-DUR,KLOR-CON Take 1 tablet (20 mEq total) by mouth daily.   predniSONE 20 MG tablet Commonly known as:  DELTASONE Take 4 tablets (80 mg total) by mouth daily with breakfast.   SUMAtriptan 100 MG tablet Commonly known as:  IMITREX TAKE 1 TABLET AT ONSET-MAY REPEAT IN 2 HOURS AS NEEDED- NO MORE THAN 2 IN 24 HOURS. What changed:  how much to take  how to take this  when to take this  reasons to take this  additional instructions         Time coordinating discharge: 35 minutes.  Signed:  RAMA,CHRISTINA  Pager [phone] [facility] 03/10/2017, 11:27 AM

## 2017-03-10 NOTE — Progress Notes (Signed)
Patient is alert and oriented, vital signs are stable, discharge instructions reviewed with patient, questions and concerns answered Neta Mends RN 11:52 AM 03-10-2017

## 2017-03-11 ENCOUNTER — Telehealth: Payer: Self-pay | Admitting: Obstetrics and Gynecology

## 2017-03-11 NOTE — Telephone Encounter (Signed)
Spoke with Caryl Pina at Dr.Feng's office. Caryl Pina will contact Dr.Ennever's office to have him speak with Dr.Feng. Per Caryl Pina since they are located in the same practice at different locations patient transfer in care has to be cleared through the physicians.

## 2017-03-11 NOTE — Telephone Encounter (Signed)
Patient states she is unable to schedule appointment with hematologists that she saw in the hospital (Dr. Burr Medico). Patient would like assistance is scheduling this appointment. Please advise. Dr. Ernestina Penna office told patient she needs to see hematologist doctor in [facility].   Please advise.

## 2017-03-12 LAB — CULTURE, BLOOD (ROUTINE X 2)
CULTURE: NO GROWTH
CULTURE: NO GROWTH
SPECIAL REQUESTS: ADEQUATE
SPECIAL REQUESTS: ADEQUATE

## 2017-03-14 ENCOUNTER — Encounter: Payer: Self-pay | Admitting: Obstetrics and Gynecology

## 2017-03-14 ENCOUNTER — Telehealth: Payer: Self-pay | Admitting: Hematology

## 2017-03-14 ENCOUNTER — Telehealth: Payer: Self-pay | Admitting: *Deleted

## 2017-03-14 ENCOUNTER — Ambulatory Visit (INDEPENDENT_AMBULATORY_CARE_PROVIDER_SITE_OTHER): Payer: 59 | Admitting: Obstetrics and Gynecology

## 2017-03-14 VITALS — BP 144/84 | HR 84 | Resp 16 | Wt 152.0 lb

## 2017-03-14 DIAGNOSIS — R188 Other ascites: Secondary | ICD-10-CM | POA: Diagnosis not present

## 2017-03-14 DIAGNOSIS — D598 Other acquired hemolytic anemias: Secondary | ICD-10-CM | POA: Diagnosis not present

## 2017-03-14 LAB — CBC WITH DIFFERENTIAL/PLATELET
Basophils Absolute: 0.1 10*3/uL (ref 0.0–0.2)
Basos: 1 %
HEMATOCRIT: 33.6 % — AB (ref 34.0–46.6)
Hemoglobin: 11.7 g/dL (ref 11.1–15.9)
LYMPHS: 7 %
Lymphocytes Absolute: 0.8 10*3/uL (ref 0.7–3.1)
MCH: 30.8 pg (ref 26.6–33.0)
MCHC: 34.8 g/dL (ref 31.5–35.7)
MCV: 88 fL (ref 79–97)
MONOCYTES: 2 %
Monocytes Absolute: 0.2 10*3/uL (ref 0.1–0.9)
NEUTROS ABS: 10.7 10*3/uL — AB (ref 1.4–7.0)
Neutrophils: 90 %
Platelets: 441 10*3/uL — ABNORMAL HIGH (ref 150–379)
RBC: 3.8 x10E6/uL (ref 3.77–5.28)
RDW: 16.4 % — ABNORMAL HIGH (ref 12.3–15.4)
WBC: 11.9 10*3/uL — AB (ref 3.4–10.8)

## 2017-03-14 NOTE — Telephone Encounter (Signed)
Appt has been scheduled for the pt to see Dr. Burr Medico on 7/20 at 345pm. Pt aware to arrive 30 minutes for labs. Address verified.

## 2017-03-14 NOTE — Progress Notes (Signed)
GYNECOLOGY  VISIT   HPI: 51 y.o.   Married  [place] American  female   [phone] with Patient's last menstrual period was 12/29/2016.   here for follow up from recent hospital stay and anemia.  The patient was admitted to the hospital 8 days ago with a hemolytic anemia and possible pelvic abscess. She was never febrile, didn't have abdominal pain. She was d/c'd home on prednisone and Augmentin on 03/10/17. She feels okay, the prednisone is not agreeing with her. She has a decreased appetite and loose stool. She has one loose stool a day after taking the prednisone. No pain, no fevers.  She just doesn't feel like herself, "feels off". Very bored, wants to go back to work.  She has an appointment to see Dr Burr Medico this Friday.   GYNECOLOGIC HISTORY: Patient's last menstrual period was 12/29/2016. Contraception:hysterectomy  Menopausal hormone therapy: none         OB History    Gravida Para Term Preterm AB Living   2 1     1 1    SAB TAB Ectopic Multiple Live Births     1     1         Patient Active Problem List   Diagnosis Date Noted  . Symptomatic anemia 03/09/2017  . Autoimmune hemolytic anemia ([place]) 03/07/2017  . Leukemoid reaction   . Anemia 03/06/2017  . Status post laparoscopic hysterectomy 02/22/2017  . Hypokalemia 02/14/2017  . Pain in joint, lower leg 03/15/2013  . Encounter for long-term (current) use of other medications 06/23/2012  . Routine general medical examination at a health care facility 06/23/2012  . Screening examination for infectious disease 06/23/2012  . HYPERCHOLESTEROLEMIA 07/30/2010  . Iron deficiency anemia 07/30/2010  . HEADACHE 03/19/2008  . Hyperthyroidism 07/10/2007  . MYALGIA 07/10/2007  . Diabetes ([place]) 04/06/2007  . Essential hypertension 04/06/2007  . URINARY INCONTINENCE 04/06/2007    Past Medical History:  Diagnosis Date  . ANEMIA, IRON DEFICIENCY   . DIABETES MELLITUS, TYPE II   . Headache(784.0)   . HYPERCHOLESTEROLEMIA   .  HYPERTENSION   . Hyperthyroidism    no meds, slightly elevated, MD will recheck in 3 months  . Migraines   . URINARY INCONTINENCE     Past Surgical History:  Procedure Laterality Date  . ABDOMINAL HYSTERECTOMY    . APPENDECTOMY    . BARTHOLIN CYST MARSUPIALIZATION Right 02/22/2017   Procedure: BARTHOLIN CYST MARSUPIALIZATION;  Surgeon: Salvadore Dom, MD;  Location: [facility];  Service: Gynecology;  Laterality: Right;  . CESAREAN SECTION  1998  . CYSTOSCOPY N/A 02/22/2017   Procedure: CYSTOSCOPY;  Surgeon: Salvadore Dom, MD;  Location: [facility];  Service: Gynecology;  Laterality: N/A;  . LAPAROSCOPIC LYSIS OF ADHESIONS N/A 02/22/2017   Procedure: EXTENSIVE LAPAROSCOPIC LYSIS OF ADHESIONS;  Surgeon: Salvadore Dom, MD;  Location: [facility];  Service: Gynecology;  Laterality: N/A;  Dr. Kieth Brightly   . OOPHORECTOMY Left 02/22/2017   Procedure: OOPHORECTOMY;  Surgeon: Salvadore Dom, MD;  Location: [facility];  Service: Gynecology;  Laterality: Left;  . TONSILLECTOMY AND ADENOIDECTOMY    . TOTAL LAPAROSCOPIC HYSTERECTOMY WITH SALPINGECTOMY Bilateral 02/22/2017   Procedure: HYSTERECTOMY TOTAL LAPAROSCOPIC WITH SALPINGECTOMY;  Surgeon: Salvadore Dom, MD;  Location: [facility];  Service: Gynecology;  Laterality: Bilateral;    Current Outpatient Prescriptions  Medication Sig Dispense Refill  . amoxicillin-clavulanate (AUGMENTIN) 875-125 MG tablet Take 1 tablet by mouth every 12 (twelve) hours. 20 tablet 0  . docusate sodium (  COLACE) 100 MG capsule Take 1 capsule (100 mg total) by mouth 2 (two) times daily. (Patient taking differently: Take 100 mg by mouth daily. ) 10 capsule 0  . hydrochlorothiazide (HYDRODIURIL) 25 MG tablet Take 1 tablet (25 mg total) by mouth daily. for high blood pressure 30 tablet 11  . metFORMIN (GLUCOPHAGE-XR) 500 MG 24 hr tablet TAKE (2) TABLETS TWICE DAILY. (Patient taking differently: Take 1,000 mg by mouth 2 (two) times daily. ) 120 tablet 11  .  pantoprazole (PROTONIX) 40 MG tablet Take 1 tablet (40 mg total) by mouth daily. 30 tablet 1  . potassium chloride SA (K-DUR,KLOR-CON) 20 MEQ tablet Take 1 tablet (20 mEq total) by mouth daily. 30 tablet 0  . predniSONE (DELTASONE) 20 MG tablet Take 4 tablets (80 mg total) by mouth daily with breakfast. 30 tablet 0  . SUMAtriptan (IMITREX) 100 MG tablet TAKE 1 TABLET AT ONSET-MAY REPEAT IN 2 HOURS AS NEEDED- NO MORE THAN 2 IN 24 HOURS. (Patient taking differently: Take 100 mg by mouth every 2 (two) hours as needed for migraine. ) 30 tablet 2   No current facility-administered medications for this visit.      ALLERGIES: Patient has no known allergies.  Family History  Problem Relation Age of Onset  . Diabetes Father   . Colon cancer Father 51       2002  . Hypertension Father   . Stroke Father   . Heart disease Father        stints  . Diabetes Mother   . Thyroid disease Mother   . Hypertension Mother   . Breast cancer Maternal Aunt   . Breast cancer Maternal Aunt   . Breast cancer Maternal Aunt   . Breast cancer Maternal Aunt   . Breast cancer Maternal Aunt     Social History   Social History  . Marital status: Married    Spouse name: N/A  . Number of children: 1  . Years of education: N/A   Occupational History  . Development worker, [employer]    Social History Main Topics  . Smoking status: Never Smoker  . Smokeless tobacco: Never Used  . Alcohol use 0.0 oz/week     Comment: occasionally  . Drug use: No  . Sexual activity: Yes    Partners: Male    Birth control/ protection: Surgical   Other Topics Concern  . Not on file   Social History Narrative   Occupation: Works Orthoptist   Regular exercise-yes          Review of Systems  Constitutional: Positive for weight loss.  HENT: Negative.   Eyes: Negative.   Respiratory: Negative.   Cardiovascular: Negative.   Gastrointestinal: Negative.   Genitourinary: Negative.   Musculoskeletal: Negative.   Skin:  Negative.   Neurological: Negative.   Endo/Heme/Allergies: Negative.   Psychiatric/Behavioral: Negative.     PHYSICAL EXAMINATION:    BP (!) 144/84 (BP Location: Right Arm, Patient Position: Sitting, Cuff Size: Normal)   Pulse 84   Resp 16   Wt 152 lb (68.9 kg)   LMP 12/29/2016   BMI 24.53 kg/m     General appearance: alert, cooperative and appears stated age Abdomen: soft, non-tender; non distended, no masses,  no organomegaly   ASSESSMENT Hemolytic anemia, being followed by Dr Burr Medico. The patient has an appointment with Dr Burr Medico on Friday. Will run out of prednisone on Thursday Possible pelvic abscess. From that standpoint she is feeling fine, no pain, no fevers  PLAN CBC with diff Continue with the Augmentin to complete a 14 day course of antibiotics F/U here next week, sooner if needed.  I will get in contact with Dr Burr Medico about the patient's prednisone She really wants to go back to work. Requests starting at 6 hours a day.    An After Visit Summary was printed and given to the patient.  Addendum: I spoke with Dr Burr Medico, she will call the patient after she see's her CBC later today to discuss options.

## 2017-03-14 NOTE — Telephone Encounter (Signed)
Spoke with Seth Bake who spoke with Dr.Feng regarding patient's appointment. Patient is scheduled to see Dr.Feng on 03/18/2017 at 3:45 pm. Spoke with patient who is aware of appointment date and time. Patient is concerned about this being so far out. Advised will return call to Dr.Feng's office to verify Dr.Feng's recommendations for appointment date.  Spoke with Seth Bake who states Dr.Feng asked for the patient to be placed on the schedule for 03/18/2017 at 3:45 pm. Advised will notify patient.   Left a detailed message for the patient advising of information regarding appointment date and time. Advised to return call to the office with any further questions.

## 2017-03-14 NOTE — Telephone Encounter (Signed)
Dr. Burr Medico reviewed lab results today.   Spoke with pt and was informed that pt is taking Prednisone 80 mg daily.  Stated she has loose mushy stools each time after taking Prednisone.   Instructed pt to decrease Prednisone to 60 mg daily - starting  Tues  03/15/17 until next office visit on  Fri  03/18/17.   Pt voiced understanding.

## 2017-03-15 ENCOUNTER — Telehealth: Payer: Self-pay | Admitting: *Deleted

## 2017-03-15 NOTE — Telephone Encounter (Signed)
Spoke with patient regarding STAT CBC results. Gave results. Patient is scheduled with hematologist on 03-18-17

## 2017-03-17 NOTE — Progress Notes (Signed)
[name]  [phone] [phone]  Clinic follow up Note   Patient Care Team: Renato Shin, MD as PCP - General Regina Eck, CNM as Referring Physician (Certified Nurse Midwife) 03/18/2017  CHIEF COMPLAINTS/PURPOSE OF CONSULTATION:  Follow up autoimmune hemolytic anemia   HISTORY OF PRESENTING ILLNESS:  Michelle Castillo 51 y.o. female is here because of hemolytic anemia.  The patient was recently hospitalized from 03/06/17 - 03/10/17 for autoimmune hemolytic anemia. She underwent hysterectomy on 02/22/17 because of symptomatic fibroid uterus with menorrhagia leading to anemia, and was feeling well until 7/8 when she experienced extreme fatigue. She was originally admitted to [facility], but was transferred to [facility] on 03/08/17. I initially consulted with her as an inpatient on 03/09/17. She was diagnosed with autoimmune hemolytic anemia, and I started her on steroids in the hospital. She responded well, was discharged home on 03/10/17 with follow up appointment in this clinic.  The patient followed up with Dr. Talbert Nan on 03/14/17. The patient reports she has lost her appetite, and has lost 5 lbs in the last week. The patient believes this is caused by Prednisone. She recently tapered this to 60 mg daily. She also reports diarrhea, which may be contributing to her weight loss. She reports she is sleeping well at night. She denies pain. She has almost completed her prescribed course of antibiotics.    MEDICAL HISTORY:  Past Medical History:  Diagnosis Date  . ANEMIA, IRON DEFICIENCY   . DIABETES MELLITUS, TYPE II   . Headache(784.0)   . HYPERCHOLESTEROLEMIA   . HYPERTENSION   . Hyperthyroidism    no meds, slightly elevated, MD will recheck in 3 months  . Migraines   . URINARY INCONTINENCE     SURGICAL HISTORY: Past Surgical History:  Procedure Laterality Date  . ABDOMINAL HYSTERECTOMY    . APPENDECTOMY    . BARTHOLIN CYST  MARSUPIALIZATION Right 02/22/2017   Procedure: BARTHOLIN CYST MARSUPIALIZATION;  Surgeon: Salvadore Dom, MD;  Location: [facility];  Service: Gynecology;  Laterality: Right;  . CESAREAN SECTION  1998  . CYSTOSCOPY N/A 02/22/2017   Procedure: CYSTOSCOPY;  Surgeon: Salvadore Dom, MD;  Location: [facility];  Service: Gynecology;  Laterality: N/A;  . LAPAROSCOPIC LYSIS OF ADHESIONS N/A 02/22/2017   Procedure: EXTENSIVE LAPAROSCOPIC LYSIS OF ADHESIONS;  Surgeon: Salvadore Dom, MD;  Location: [facility];  Service: Gynecology;  Laterality: N/A;  Dr. Kieth Brightly   . OOPHORECTOMY Left 02/22/2017   Procedure: OOPHORECTOMY;  Surgeon: Salvadore Dom, MD;  Location: [facility];  Service: Gynecology;  Laterality: Left;  . TONSILLECTOMY AND ADENOIDECTOMY    . TOTAL LAPAROSCOPIC HYSTERECTOMY WITH SALPINGECTOMY Bilateral 02/22/2017   Procedure: HYSTERECTOMY TOTAL LAPAROSCOPIC WITH SALPINGECTOMY;  Surgeon: Salvadore Dom, MD;  Location: [facility];  Service: Gynecology;  Laterality: Bilateral;    SOCIAL HISTORY: Social History   Social History  . Marital status: Married    Spouse name: N/A  . Number of children: 1  . Years of education: N/A   Occupational History  . Development worker, international aid    Social History Main Topics  . Smoking status: Never Smoker  . Smokeless tobacco: Never Used  . Alcohol use 0.0 oz/week     Comment: occasionally  . Drug use: No  . Sexual activity: Yes    Partners: Male    Birth control/ protection: Surgical   Other Topics Concern  . Not on file   Social History Narrative   Occupation: Works Science writer  Services   Regular exercise-yes          FAMILY HISTORY: Family History  Problem Relation Age of Onset  . Diabetes Father   . Colon cancer Father 46       2002  . Hypertension Father   . Stroke Father   . Heart disease Father        stints  . Diabetes Mother   . Thyroid disease Mother   . Hypertension Mother   . Breast cancer Maternal Aunt   . Breast cancer  Maternal Aunt   . Breast cancer Maternal Aunt   . Breast cancer Maternal Aunt   . Breast cancer Maternal Aunt     ALLERGIES:  has No Known Allergies.  MEDICATIONS:  Current Outpatient Prescriptions  Medication Sig Dispense Refill  . amoxicillin-clavulanate (AUGMENTIN) 875-125 MG tablet Take 1 tablet by mouth every 12 (twelve) hours. 20 tablet 0  . docusate sodium (COLACE) 100 MG capsule Take 1 capsule (100 mg total) by mouth 2 (two) times daily. (Patient taking differently: Take 100 mg by mouth daily. ) 10 capsule 0  . folic acid (FOLVITE) 1 MG tablet Take 1 tablet (1 mg total) by mouth daily. 30 tablet 0  . hydrochlorothiazide (HYDRODIURIL) 25 MG tablet Take 1 tablet (25 mg total) by mouth daily. for high blood pressure 30 tablet 11  . metFORMIN (GLUCOPHAGE-XR) 500 MG 24 hr tablet TAKE (2) TABLETS TWICE DAILY. (Patient taking differently: Take 1,000 mg by mouth 2 (two) times daily. ) 120 tablet 11  . pantoprazole (PROTONIX) 40 MG tablet Take 1 tablet (40 mg total) by mouth daily. 30 tablet 1  . potassium chloride SA (K-DUR,KLOR-CON) 20 MEQ tablet Take 1 tablet (20 mEq total) by mouth daily. 30 tablet 0  . predniSONE (DELTASONE) 20 MG tablet Take 4 tablets (80 mg total) by mouth daily with breakfast. 30 tablet 0  . predniSONE (STERAPRED UNI-PAK 48 TAB) 10 MG (48) TBPK tablet Take 4 tab daily for 4 days then 3 tab daily for 4 days then 2 tab daily for 4 days then 1 tab daily for 4 days then 1/2 tab daily for 4 days then stop 48 tablet 0  . SUMAtriptan (IMITREX) 100 MG tablet TAKE 1 TABLET AT ONSET-MAY REPEAT IN 2 HOURS AS NEEDED- NO MORE THAN 2 IN 24 HOURS. (Patient taking differently: Take 100 mg by mouth every 2 (two) hours as needed for migraine. ) 30 tablet 2   No current facility-administered medications for this visit.     REVIEW OF SYSTEMS:  Constitutional: Denies fevers, pain, chills or abnormal night sweats, (+) loss of appetite, (+) 5 lb weight loss Eyes: Denies blurriness of  vision, double vision or watery eyes Ears, nose, mouth, throat, and face: Denies mucositis or sore throat Respiratory: Denies cough, dyspnea or wheezes Cardiovascular: Denies palpitation, chest discomfort or lower extremity swelling Gastrointestinal:  Denies nausea, heartburn, (+) occasional diarrhea Skin: Denies abnormal skin rashes Lymphatics: Denies new lymphadenopathy or easy bruising Neurological:Denies numbness, tingling or new weaknesses Behavioral/Psych: Mood is stable, no new changes  All other systems were reviewed with the patient and are negative.  PHYSICAL EXAMINATION: ECOG PERFORMANCE STATUS: 1 - Symptomatic but completely ambulatory  Vitals:   03/18/17 1548  BP: 127/83  Pulse: (!) 102  Resp: (!) 22  Temp: (!) 97.5 F (36.4 C)   Filed Weights   03/18/17 1548  Weight: 147 lb 4.8 oz (66.8 kg)    GENERAL: alert, no distress and comfortable SKIN: skin  color, texture, turgor are normal, no rashes or significant lesions EYES: normal, conjunctiva are pink and non-injected, sclera clear OROPHARYNX: no exudate, no erythema and lips, buccal mucosa, and tongue normal  NECK: supple, thyroid normal size, non-tender, without nodularity LYMPH:  no palpable lymphadenopathy in the cervical, axillary or inguinal LUNGS: clear to auscultation and percussion with normal breathing effort HEART: regular rate & rhythm and no murmurs and no lower extremity edema ABDOMEN: abdomen soft, non-tender and normal bowel sounds Musculoskeletal: no cyanosis of digits and no clubbing  PSYCH: alert & oriented x 3 with fluent speech NEURO: no focal motor/sensory deficits  LABORATORY DATA:  I have reviewed the data as listed CBC Latest Ref Rng & Units 03/18/2017 03/14/2017 03/10/2017  WBC 3.9 - 10.3 10e3/uL 15.2(H) 11.9(H) 18.9(H)  Hemoglobin 11.6 - 15.9 g/dL 12.7 11.7 9.2(L)  Hematocrit 34.8 - 46.6 % 38.1 33.6(L) 26.7(L)  Platelets 145 - 400 10e3/uL 464(H) 441(H) 362    CMP Latest Ref Rng &  Units 03/18/2017 03/10/2017 03/09/2017  Glucose 70 - 140 mg/dl 317(H) - 203(H)  BUN 7.0 - 26.0 mg/dL 17.6 - 21(H)  Creatinine 0.6 - 1.1 mg/dL 1.2(H) - 0.73  Sodium 136 - 145 mEq/L 134(L) - 141  Potassium 3.5 - 5.1 mEq/L 3.8 - 3.6  Chloride 101 - 111 mmol/L - - 106  CO2 22 - 29 mEq/L 25 - 25  Calcium 8.4 - 10.4 mg/dL 10.0 - 9.3  Total Protein 6.4 - 8.3 g/dL 7.3 - 6.9  Total Bilirubin 0.20 - 1.20 mg/dL 1.01 1.2 2.3(H)  Alkaline Phos 40 - 150 U/L 62 - 56  AST 5 - 34 U/L 15 - 33  ALT 0 - 55 U/L 24 - 101(H)     RADIOGRAPHIC STUDIES: I have personally reviewed the radiological images as listed and agreed with the findings in the report. Ct Abdomen Pelvis W Contrast  Result Date: 03/07/2017 CLINICAL DATA:  History of hysterectomy, elevated white count low hemoglobin EXAM: CT ABDOMEN AND PELVIS WITH CONTRAST TECHNIQUE: Multidetector CT imaging of the abdomen and pelvis was performed using the standard protocol following bolus administration of intravenous contrast. CONTRAST:  156mL ISOVUE-300 IOPAMIDOL (ISOVUE-300) INJECTION 61%, 30mL ISOVUE-300 IOPAMIDOL (ISOVUE-300) INJECTION 61% COMPARISON:  Pelvic ultrasound 01/18/2017 FINDINGS: Lower chest: Coarse calcified left posterior subpleural nodule, measuring 13 mm. No pleural effusion. Borderline cardiomegaly. Trace pericardial effusion. Hepatobiliary: No focal liver abnormality is seen. No gallstones, gallbladder wall thickening, or biliary dilatation. Pancreas: Unremarkable. No pancreatic ductal dilatation or surrounding inflammatory changes. Spleen: Vague hypodense mass with calcification in the medial aspect of the spleen measuring 4 by 3.6 cm. Adrenals/Urinary Tract: Adrenal glands are unremarkable. Kidneys are normal, without renal calculi, focal lesion, or hydronephrosis. Bladder is unremarkable. Stomach/Bowel: Stomach is within normal limits. Appendix consistent with history of appendectomy. No evidence of bowel wall thickening, distention, or  inflammatory changes. Vascular/Lymphatic: Non aneurysmal aorta. subcentimeter retroperitoneal nodes. Reproductive: Status post hysterectomy. Other: No free air. Gas and fluid collection between the bladder and vaginal cuff, measuring approximately 4.4 by 1 cm. In the right pelvis, there is a 3.2 cm hypodense lesion. Which presumably represents a cyst within the right ovary. Musculoskeletal: No acute or significant osseous findings. IMPRESSION: 1. Status post hysterectomy. Small irregular fluid collection containing small gas bubbles between the bladder and upper vaginal cuff, could represent resolving postop fluid collection however other consideration would be pelvic abscess. 2. 3.2 cm hypodensity in the right pelvis presumably represents a cyst in the right ovary. 3. Indeterminate 4 cm  hypodense lesion in the posterior spleen. Recommend nonemergent MRI for further evaluation. 4. 13 mm coarsely calcified left lung base nodule could represent hamartoma or large granuloma Electronically Signed   By: Donavan Foil M.D.   On: 03/07/2017 03:01    ASSESSMENT & PLAN:  No problem-specific Assessment & Plan notes found for this encounter.  YURITZI KAMP is a 51 y.o. woman with history of iron deficiency due to menorrhagia, recent hysterectomy on 02/22/2017, and was found to have severe anemia and required hospitalization and multiple blood transfusion on 03/06/2017.  1. Autoimmune hemolytic anemia  - We reviewed the patient's lab results, which is consistent with hemolytic anemia, Coombs test was positive. -She has responded well to steroids, currently on prednisone 60 mg daily, anemia has resolved, hemoglobin 12.7 today. Her LDH has decreased normal also today, which indicating resolving hemolysis She has not required any blood transfusions since she started steroids. - the patient reports Prednisone 60 mg is making her lose her appetite, and causing diarrhea. She has lost 5 lbs recently. -I will taper her  Prednisone to 40 mg on 7/23, and decrease 10mg  every 4 days, until 10mg  daily for 4 days then 5mg  daily for 4 days then stop, she was provided with verbal and written instructions. This will taper off completely in the next 20 days. - We reviewed the patient's labs today. Kidney/liver function is normal today (7/20).  - Platelet count is elevated at 464 today (7/20). The patient will resume iron supplements. - Her blood glucose is elevated at 317 today (7/20). The patient may increase her metformin to 1000 mg twice daily as needed to manage her blood glucose. - I will prescribe folic acid supplement. - She will continue to take prescribed Protonix as needed. - The patient will continue to stay well hydrated to combat dehydration from diarrhea and high blood glucose.  2. History of iron deficiency secondary to menorrhagia -She is status post hysterectomy in June 2018 -She was previously on oral iron. -I will recommend her to resume oral iron, and I'll check her level on next visit.  3. Pelvic fluid collection, rule out abscess -She was previously treated with broad antibiotics in the hospital, and still on oral Augmentin - I will order a CT pelvis with contrast on 8/6 to follow up the fluid collection in the pelvis. -She is afebrile, no abdominal tenderness.  4. Leukocytosis -Likely reactive, to her hemolytic anemia -WBC is trending down, 15 K today -Continue follow-up  PLAN  - Labs reviewed today.  - Continue Prednisone 60 mg until 7/23, then taper to 40 mg and gradually taper off in 20 days; she was provided with verbal and written instructions. Refill prescription today. - Patient will begin folic acid, I will prescribe this today. She will also resume oral iron supplements. - Return for labs weekly on Fridays x3. - I will order a CT pelvis with contrast on 8/6 to review fluid collection in the pelvis. - Follow up in 3 weeks to review labs and CT pelvis.   All questions were  answered. The patient knows to call the clinic with any problems, questions or concerns.  I spent 25 minutes counseling the patient face to face. The total time spent in the appointment was 30 minutes and more than 50% was on counseling.  This document serves as a record of services personally performed by Truitt Merle, MD. It was created on her behalf by Maryla Morrow, a trained medical scribe. The creation of  this record is based on the scribe's personal observations and the provider's statements to them. This document has been checked and approved by the attending provider.     Truitt Merle, MD 03/18/17

## 2017-03-18 ENCOUNTER — Other Ambulatory Visit (HOSPITAL_BASED_OUTPATIENT_CLINIC_OR_DEPARTMENT_OTHER): Payer: 59

## 2017-03-18 ENCOUNTER — Telehealth: Payer: Self-pay | Admitting: Hematology

## 2017-03-18 ENCOUNTER — Ambulatory Visit (HOSPITAL_BASED_OUTPATIENT_CLINIC_OR_DEPARTMENT_OTHER): Payer: 59 | Admitting: Hematology

## 2017-03-18 VITALS — BP 127/83 | HR 102 | Temp 97.5°F | Resp 22 | Ht 66.0 in | Wt 147.3 lb

## 2017-03-18 DIAGNOSIS — N920 Excessive and frequent menstruation with regular cycle: Secondary | ICD-10-CM

## 2017-03-18 DIAGNOSIS — D591 Autoimmune hemolytic anemia, unspecified: Secondary | ICD-10-CM

## 2017-03-18 DIAGNOSIS — R188 Other ascites: Secondary | ICD-10-CM

## 2017-03-18 DIAGNOSIS — D500 Iron deficiency anemia secondary to blood loss (chronic): Secondary | ICD-10-CM | POA: Diagnosis not present

## 2017-03-18 DIAGNOSIS — D72829 Elevated white blood cell count, unspecified: Secondary | ICD-10-CM | POA: Diagnosis not present

## 2017-03-18 DIAGNOSIS — N739 Female pelvic inflammatory disease, unspecified: Secondary | ICD-10-CM

## 2017-03-18 LAB — COMPREHENSIVE METABOLIC PANEL
ALBUMIN: 4.3 g/dL (ref 3.5–5.0)
ALK PHOS: 62 U/L (ref 40–150)
ALT: 24 U/L (ref 0–55)
AST: 15 U/L (ref 5–34)
Anion Gap: 12 mEq/L — ABNORMAL HIGH (ref 3–11)
BUN: 17.6 mg/dL (ref 7.0–26.0)
CO2: 25 mEq/L (ref 22–29)
Calcium: 10 mg/dL (ref 8.4–10.4)
Chloride: 98 mEq/L (ref 98–109)
Creatinine: 1.2 mg/dL — ABNORMAL HIGH (ref 0.6–1.1)
EGFR: 62 mL/min/{1.73_m2} — ABNORMAL LOW (ref >90)
GLUCOSE: 317 mg/dL — AB (ref 70–140)
POTASSIUM: 3.8 meq/L (ref 3.5–5.1)
SODIUM: 134 meq/L — AB (ref 136–145)
TOTAL PROTEIN: 7.3 g/dL (ref 6.4–8.3)
Total Bilirubin: 1.01 mg/dL (ref 0.20–1.20)

## 2017-03-18 LAB — CBC & DIFF AND RETIC
BASO%: 0.1 % (ref 0.0–2.0)
BASOS ABS: 0 10*3/uL (ref 0.0–0.1)
EOS ABS: 0 10*3/uL (ref 0.0–0.5)
EOS%: 0 % (ref 0.0–7.0)
HEMATOCRIT: 38.1 % (ref 34.8–46.6)
HEMOGLOBIN: 12.7 g/dL (ref 11.6–15.9)
Immature Retic Fract: 2.9 % (ref 1.60–10.00)
LYMPH#: 0.7 10*3/uL — AB (ref 0.9–3.3)
LYMPH%: 4.3 % — ABNORMAL LOW (ref 14.0–49.7)
MCH: 31.1 pg (ref 25.1–34.0)
MCHC: 33.3 g/dL (ref 31.5–36.0)
MCV: 93.2 fL (ref 79.5–101.0)
MONO#: 0.3 10*3/uL (ref 0.1–0.9)
MONO%: 1.9 % (ref 0.0–14.0)
NEUT#: 14.2 10*3/uL — ABNORMAL HIGH (ref 1.5–6.5)
NEUT%: 93.7 % — ABNORMAL HIGH (ref 38.4–76.8)
Platelets: 464 10*3/uL — ABNORMAL HIGH (ref 145–400)
RBC: 4.09 10*6/uL (ref 3.70–5.45)
RDW: 15.7 % — AB (ref 11.2–14.5)
RETIC %: 2.72 % — AB (ref 0.70–2.10)
RETIC CT ABS: 111.25 10*3/uL — AB (ref 33.70–90.70)
WBC: 15.2 10*3/uL — ABNORMAL HIGH (ref 3.9–10.3)

## 2017-03-18 LAB — LACTATE DEHYDROGENASE: LDH: 202 U/L (ref 125–245)

## 2017-03-18 MED ORDER — PREDNISONE 10 MG (48) PO TBPK: ORAL_TABLET | ORAL | 0 refills | Status: DC

## 2017-03-18 MED ORDER — FOLIC ACID 1 MG PO TABS: 1.0000 mg | ORAL_TABLET | Freq: Every day | ORAL | 0 refills | Status: DC

## 2017-03-18 NOTE — Telephone Encounter (Signed)
Scheduled appt per 7/20 los - Gave patient AVS and calender per los.  

## 2017-03-20 ENCOUNTER — Encounter: Payer: Self-pay | Admitting: Hematology

## 2017-03-20 ENCOUNTER — Emergency Department (HOSPITAL_COMMUNITY)
Admission: EM | Admit: 2017-03-20 | Discharge: 2017-03-20 | Disposition: A | Discharge status: Home / Self Care | Payer: 59 | Attending: Emergency Medicine | Admitting: Emergency Medicine

## 2017-03-20 DIAGNOSIS — D649 Anemia, unspecified: Secondary | ICD-10-CM | POA: Diagnosis not present

## 2017-03-20 DIAGNOSIS — Z79899 Other long term (current) drug therapy: Secondary | ICD-10-CM | POA: Diagnosis not present

## 2017-03-20 DIAGNOSIS — F419 Anxiety disorder, unspecified: Secondary | ICD-10-CM | POA: Diagnosis not present

## 2017-03-20 DIAGNOSIS — Z7984 Long term (current) use of oral hypoglycemic drugs: Secondary | ICD-10-CM | POA: Diagnosis not present

## 2017-03-20 DIAGNOSIS — F309 Manic episode, unspecified: Secondary | ICD-10-CM | POA: Diagnosis not present

## 2017-03-20 DIAGNOSIS — E119 Type 2 diabetes mellitus without complications: Secondary | ICD-10-CM | POA: Diagnosis not present

## 2017-03-20 DIAGNOSIS — I10 Essential (primary) hypertension: Secondary | ICD-10-CM | POA: Insufficient documentation

## 2017-03-20 MED ORDER — LORAZEPAM 2 MG/ML IJ SOLN
2.0000 mg | Freq: Once | INTRAMUSCULAR | Status: AC
Start: 2017-03-20 — End: 2017-03-20
  Administered 2017-03-20: 2 mg via INTRAMUSCULAR
  Filled 2017-03-20: qty 1

## 2017-03-20 MED ORDER — ZOLPIDEM TARTRATE 5 MG PO TABS
5.0000 mg | ORAL_TABLET | Freq: Once | ORAL | Status: AC
  Administered 2017-03-20: 5 mg via ORAL
  Filled 2017-03-20: qty 1

## 2017-03-20 NOTE — Discharge Instructions (Signed)
We really think that you need to be seen by the therapist at [facility]. Please go to their walk in clinic early in the morning (8am). Please come to the ER if the symptoms get severe.

## 2017-03-20 NOTE — ED Notes (Signed)
Pt brought in by friend, pt very hesitant to talk to this RN, all she will state is that she is highly anxious and has not been eating/sleeping past few days and she is worried about her job. Pt will not state exact reason why she is anxious. Very withdrawn. Denies SI/HI but apparently she told friend yesterday "she cant do this anymore"

## 2017-03-20 NOTE — ED Notes (Signed)
Per family patient is very anxious.  Lying still on bed.  When asked questions family answering for her.  No distress noted at this time.

## 2017-03-21 ENCOUNTER — Ambulatory Visit (INDEPENDENT_AMBULATORY_CARE_PROVIDER_SITE_OTHER): Payer: 59 | Admitting: Obstetrics and Gynecology

## 2017-03-21 ENCOUNTER — Encounter: Payer: Self-pay | Admitting: Obstetrics and Gynecology

## 2017-03-21 VITALS — BP 112/60 | HR 120 | Resp 16 | Wt 146.0 lb

## 2017-03-21 DIAGNOSIS — Z90710 Acquired absence of both cervix and uterus: Secondary | ICD-10-CM

## 2017-03-21 DIAGNOSIS — F329 Major depressive disorder, single episode, unspecified: Secondary | ICD-10-CM | POA: Diagnosis not present

## 2017-03-21 DIAGNOSIS — Z862 Personal history of diseases of the blood and blood-forming organs and certain disorders involving the immune mechanism: Secondary | ICD-10-CM | POA: Diagnosis not present

## 2017-03-21 DIAGNOSIS — F419 Anxiety disorder, unspecified: Secondary | ICD-10-CM

## 2017-03-21 MED ORDER — CITALOPRAM HYDROBROMIDE 20 MG PO TABS: ORAL_TABLET | ORAL | 1 refills | Status: DC

## 2017-03-21 MED ORDER — LORAZEPAM 1 MG PO TABS: ORAL_TABLET | ORAL | 0 refills | Status: DC

## 2017-03-21 NOTE — Progress Notes (Signed)
GYNECOLOGY  VISIT   HPI: 51 y.o.   Married  [place] American  female   [phone] with Patient's last menstrual period was 12/29/2016.   here for 1 month F/U [facility]. Patient is having severe anxiety    She was admitted 2 weeks post op with severe hemolytic anemia. She was transfused and started on steroids. She was also treated for a possible pelvic abscess with IV antibiotics for an elevated WBC and pelvic collection on CT. She never had fevers or abdominal/pelvic pain.  She was seen in the ER last night with anxiety and the inability to sleep for days, worried about her job. The ER doctor recommended she f/u at the walk in [facility] clinic today. She works in [employer] and doesn't want to go to the clinic. She states that she feels a loss of direction, she is anxious and scared. No thoughts of hurting herself or others.  Some depression She is seeing Dr Burr Medico in hematology and is currently on a steroid taper. Her last hgb was normal. She continues to loose weight. No appetite.  She still doesn't know what caused the hemolytic anemia.  As of the time the note was written, no notes from the ER MD are in the chart.    GYNECOLOGIC HISTORY: Patient's last menstrual period was 12/29/2016. Contraception:hysterectomy  Menopausal hormone therapy: none         OB History    Gravida Para Term Preterm AB Living   2 1     1 1    SAB TAB Ectopic Multiple Live Births     1     1         Patient Active Problem List   Diagnosis Date Noted  . Symptomatic anemia 03/09/2017  . Autoimmune hemolytic anemia ([place]) 03/07/2017  . Leukemoid reaction   . Anemia 03/06/2017  . Status post laparoscopic hysterectomy 02/22/2017  . Hypokalemia 02/14/2017  . Pain in joint, lower leg 03/15/2013  . Encounter for long-term (current) use of other medications 06/23/2012  . Routine general medical examination at a health care facility 06/23/2012  . Screening examination for infectious disease 06/23/2012  .  HYPERCHOLESTEROLEMIA 07/30/2010  . Iron deficiency anemia 07/30/2010  . HEADACHE 03/19/2008  . Hyperthyroidism 07/10/2007  . MYALGIA 07/10/2007  . Diabetes ([place]) 04/06/2007  . Essential hypertension 04/06/2007  . URINARY INCONTINENCE 04/06/2007    Past Medical History:  Diagnosis Date  . ANEMIA, IRON DEFICIENCY   . DIABETES MELLITUS, TYPE II   . Headache(784.0)   . HYPERCHOLESTEROLEMIA   . HYPERTENSION   . Hyperthyroidism    no meds, slightly elevated, MD will recheck in 3 months  . Migraines   . URINARY INCONTINENCE     Past Surgical History:  Procedure Laterality Date  . ABDOMINAL HYSTERECTOMY    . APPENDECTOMY    . BARTHOLIN CYST MARSUPIALIZATION Right 02/22/2017   Procedure: BARTHOLIN CYST MARSUPIALIZATION;  Surgeon: Salvadore Dom, MD;  Location: [facility];  Service: Gynecology;  Laterality: Right;  . CESAREAN SECTION  1998  . CYSTOSCOPY N/A 02/22/2017   Procedure: CYSTOSCOPY;  Surgeon: Salvadore Dom, MD;  Location: [facility];  Service: Gynecology;  Laterality: N/A;  . LAPAROSCOPIC LYSIS OF ADHESIONS N/A 02/22/2017   Procedure: EXTENSIVE LAPAROSCOPIC LYSIS OF ADHESIONS;  Surgeon: Salvadore Dom, MD;  Location: [facility];  Service: Gynecology;  Laterality: N/A;  Dr. Kieth Brightly   . OOPHORECTOMY Left 02/22/2017   Procedure: OOPHORECTOMY;  Surgeon: Salvadore Dom, MD;  Location: [facility];  Service: Gynecology;  Laterality: Left;  . TONSILLECTOMY AND ADENOIDECTOMY    . TOTAL LAPAROSCOPIC HYSTERECTOMY WITH SALPINGECTOMY Bilateral 02/22/2017   Procedure: HYSTERECTOMY TOTAL LAPAROSCOPIC WITH SALPINGECTOMY;  Surgeon: Salvadore Dom, MD;  Location: [facility];  Service: Gynecology;  Laterality: Bilateral;    Current Outpatient Prescriptions  Medication Sig Dispense Refill  . amoxicillin-clavulanate (AUGMENTIN) 875-125 MG tablet Take 1 tablet by mouth every 12 (twelve) hours. 20 tablet 0  . folic acid (FOLVITE) 1 MG tablet Take 1 tablet (1 mg total) by mouth daily. 30  tablet 0  . hydrochlorothiazide (HYDRODIURIL) 25 MG tablet Take 1 tablet (25 mg total) by mouth daily. for high blood pressure 30 tablet 11  . metFORMIN (GLUCOPHAGE-XR) 500 MG 24 hr tablet TAKE (2) TABLETS TWICE DAILY. (Patient taking differently: Take 1,000 mg by mouth 2 (two) times daily. ) 120 tablet 11  . pantoprazole (PROTONIX) 40 MG tablet Take 1 tablet (40 mg total) by mouth daily. 30 tablet 1  . potassium chloride SA (K-DUR,KLOR-CON) 20 MEQ tablet Take 1 tablet (20 mEq total) by mouth daily. 30 tablet 0  . predniSONE (STERAPRED UNI-PAK 48 TAB) 10 MG (48) TBPK tablet Take 4 tab daily for 4 days then 3 tab daily for 4 days then 2 tab daily for 4 days then 1 tab daily for 4 days then 1/2 tab daily for 4 days then stop 48 tablet 0  . SUMAtriptan (IMITREX) 100 MG tablet TAKE 1 TABLET AT ONSET-MAY REPEAT IN 2 HOURS AS NEEDED- NO MORE THAN 2 IN 24 HOURS. (Patient taking differently: Take 100 mg by mouth every 2 (two) hours as needed for migraine. ) 30 tablet 2   No current facility-administered medications for this visit.      ALLERGIES: Patient has no known allergies.  Family History  Problem Relation Age of Onset  . Diabetes Father   . Colon cancer Father 59       2002  . Hypertension Father   . Stroke Father   . Heart disease Father        stints  . Diabetes Mother   . Thyroid disease Mother   . Hypertension Mother   . Breast cancer Maternal Aunt   . Breast cancer Maternal Aunt   . Breast cancer Maternal Aunt   . Breast cancer Maternal Aunt   . Breast cancer Maternal Aunt     Social History   Social History  . Marital status: Married    Spouse name: N/A  . Number of children: 1  . Years of education: N/A   Occupational History  . Development worker, international aid    Social History Main Topics  . Smoking status: Never Smoker  . Smokeless tobacco: Never Used  . Alcohol use 0.0 oz/week     Comment: occasionally  . Drug use: No  . Sexual activity: Yes    Partners: Male    Birth  control/ protection: Surgical   Other Topics Concern  . Not on file   Social History Narrative   Occupation: Works Orthoptist   Regular exercise-yes          Review of Systems  Eyes: Negative.   Respiratory: Negative.   Cardiovascular: Negative.   Gastrointestinal: Negative.   Genitourinary: Negative.   Musculoskeletal: Negative.   Neurological: Negative.   Endo/Heme/Allergies: Negative.   Psychiatric/Behavioral:       Severe anxiety     PHYSICAL EXAMINATION:    BP 112/60 (BP Location: Right Arm, Patient Position: Sitting, Cuff Size:  Normal)   Pulse (!) 120   Resp 16   Wt 146 lb (66.2 kg)   LMP 12/29/2016   BMI 22.87 kg/m     General appearance: alert, cooperative and appears stated age Abdomen: soft, non-tender; bowel sounds normal; no masses,  no organomegaly Incisions well healed  Pelvic: External genitalia:  no lesions              Urethra:  normal appearing urethra with no masses, tenderness or lesions              Bartholins and Skenes: normal                 Vagina: normal appearing vagina with normal color and discharge, no lesions              Cervix: absent   Vaginal cuff is healing well, min amount of brown d/c, no granulation tissue  Not tender              Bimanual Exam:  Uterus:  uterus absent              Adnexa: no mass, fullness, tenderness                Chaperone was present for exam.  ASSESSMENT 4 weeks s/p [facility] Hemolytic anemia, helped with steroids. Currently weaning Anxiety, depression Loss of appetite, weight loss. 163 pre-op, now 146.     PLAN She is being followed by Dr Burr Medico with weekly labs, steroid taper Has a f/u CT scan on 04/06/17 (lesion in her spleen) She declines f/u with mental health clinic today She is willing to try Celexa, will start at 10 mg a day, increase to 20 mg a day if she is tolerating it Ativan for sleep just as she is getting on the Celexa, #10, no refills Names of counselors given She will F/U with Dr  Burr Medico F/U here in a month, sooner if needed    An After Visit Summary was printed and given to the patient.  In addition to her post op check, over 30 minutes was spent  Face to face, 100% in counseling about depression and anxiety.   CC: Dr Burr Medico, Dr Loanne Drilling

## 2017-03-23 ENCOUNTER — Telehealth: Payer: Self-pay | Admitting: Hematology

## 2017-03-23 ENCOUNTER — Telehealth: Payer: Self-pay | Admitting: *Deleted

## 2017-03-23 ENCOUNTER — Other Ambulatory Visit: Payer: 59

## 2017-03-23 NOTE — Telephone Encounter (Signed)
Verbal order received and read back from Dr. Burr Medico for prednisone to be stopped/discontinued.  F/U will be rescheduled for this week.  Order given to patient at this time.  Instructed to expect a call with appointment information.  With this call sounds well.  "I've not taken prednisone since yesterday.  It's very hard to concentrate."

## 2017-03-23 NOTE — Telephone Encounter (Signed)
sw pt to confirm 7/26 appt at 330 pm per sch msg

## 2017-03-23 NOTE — Telephone Encounter (Signed)
"  Son Michelle Castillo of the home calling for my mom.   1. Need to stop the prednisone today  The taper is taking too long.  Today will be day one of taking three pills for four days.   2. She shakes and cries with anxiety.  No rhyme or reason or grounds for her stress.  Just occurs out of nowhere.  She didn't have this before starting prednisone.  Had work stress but knew how to handle this.  She was to go to a meeting at 11:00 and can't dress herself.  She's in the floor rolling back and forth.  Sunday she was in the ED for an anxiety attack.  Now she sleeps better but during the day she still bad. 3. Is there a different medicine to take instead of prednisone? 4. She needs to be seen before 04-08-2017.  Return number [phone]."   Will notify provider.    Lorazepam and Ambien received in ED.  Does not want to reported to [facility].  Nurse instructions to not stop prednisone due to side effects and symptoms sudden discontinuation can be unpleasant.

## 2017-03-24 ENCOUNTER — Ambulatory Visit (HOSPITAL_BASED_OUTPATIENT_CLINIC_OR_DEPARTMENT_OTHER): Payer: 59 | Admitting: Hematology

## 2017-03-24 ENCOUNTER — Other Ambulatory Visit (HOSPITAL_BASED_OUTPATIENT_CLINIC_OR_DEPARTMENT_OTHER): Payer: 59

## 2017-03-24 ENCOUNTER — Encounter: Payer: Self-pay | Admitting: Hematology

## 2017-03-24 VITALS — BP 140/124 | HR 106 | Temp 98.5°F | Resp 19 | Ht 67.0 in | Wt 143.1 lb

## 2017-03-24 DIAGNOSIS — D591 Autoimmune hemolytic anemia, unspecified: Secondary | ICD-10-CM

## 2017-03-24 DIAGNOSIS — D72829 Elevated white blood cell count, unspecified: Secondary | ICD-10-CM

## 2017-03-24 DIAGNOSIS — D500 Iron deficiency anemia secondary to blood loss (chronic): Secondary | ICD-10-CM | POA: Diagnosis not present

## 2017-03-24 DIAGNOSIS — N920 Excessive and frequent menstruation with regular cycle: Secondary | ICD-10-CM | POA: Diagnosis not present

## 2017-03-24 LAB — COMPREHENSIVE METABOLIC PANEL
ALT: 19 U/L (ref 0–55)
AST: 9 U/L (ref 5–34)
Albumin: 4 g/dL (ref 3.5–5.0)
Alkaline Phosphatase: 51 U/L (ref 40–150)
Anion Gap: 10 mEq/L (ref 3–11)
BILIRUBIN TOTAL: 0.66 mg/dL (ref 0.20–1.20)
BUN: 14.9 mg/dL (ref 7.0–26.0)
CHLORIDE: 102 meq/L (ref 98–109)
CO2: 27 meq/L (ref 22–29)
CREATININE: 1.1 mg/dL (ref 0.6–1.1)
Calcium: 10 mg/dL (ref 8.4–10.4)
EGFR: 65 mL/min/{1.73_m2} — ABNORMAL LOW (ref >90)
GLUCOSE: 166 mg/dL — AB (ref 70–140)
Potassium: 3.3 mEq/L — ABNORMAL LOW (ref 3.5–5.1)
Sodium: 139 mEq/L (ref 136–145)
Total Protein: 6.9 g/dL (ref 6.4–8.3)

## 2017-03-24 LAB — CBC & DIFF AND RETIC
BASO%: 1 % (ref 0.0–2.0)
Basophils Absolute: 0.1 10*3/uL (ref 0.0–0.1)
EOS ABS: 0.1 10*3/uL (ref 0.0–0.5)
EOS%: 1.5 % (ref 0.0–7.0)
HEMATOCRIT: 39.3 % (ref 34.8–46.6)
HEMOGLOBIN: 13.1 g/dL (ref 11.6–15.9)
Immature Retic Fract: 0.7 % — ABNORMAL LOW (ref 1.60–10.00)
LYMPH%: 23.8 % (ref 14.0–49.7)
MCH: 31 pg (ref 25.1–34.0)
MCHC: 33.3 g/dL (ref 31.5–36.0)
MCV: 93.1 fL (ref 79.5–101.0)
MONO#: 0.6 10*3/uL (ref 0.1–0.9)
MONO%: 6.7 % (ref 0.0–14.0)
NEUT%: 67 % (ref 38.4–76.8)
NEUTROS ABS: 6.3 10*3/uL (ref 1.5–6.5)
Platelets: 348 10*3/uL (ref 145–400)
RBC: 4.22 10*6/uL (ref 3.70–5.45)
RDW: 14.6 % — AB (ref 11.2–14.5)
RETIC %: 1.21 % (ref 0.70–2.10)
Retic Ct Abs: 51.06 10*3/uL (ref 33.70–90.70)
WBC: 9.4 10*3/uL (ref 3.9–10.3)
lymph#: 2.2 10*3/uL (ref 0.9–3.3)

## 2017-03-24 LAB — LACTATE DEHYDROGENASE: LDH: 142 U/L (ref 125–245)

## 2017-03-24 NOTE — Progress Notes (Signed)
[place]  Telephone:([phone]) [phone] Fax:(336) [phone]  Clinic follow up Note   Patient Care Team: Renato Shin, MD as PCP - General Regina Eck, CNM as Referring Physician (Certified Nurse Midwife) 03/24/2017  CHIEF COMPLAINTS:  Follow up autoimmune hemolytic anemia   HISTORY OF PRESENTING ILLNESS:  Michelle Castillo 51 y.o. female is here because of hemolytic anemia.  The patient was recently hospitalized from 03/06/17 - 03/10/17 for autoimmune hemolytic anemia. She underwent hysterectomy on 02/22/17 because of symptomatic fibroid uterus with menorrhagia leading to anemia, and was feeling well until 7/8 when she experienced extreme fatigue. She was originally admitted to [facility], but was transferred to [facility] on 03/08/17. I initially consulted with her as an inpatient on 03/09/17. She was diagnosed with autoimmune hemolytic anemia, and I started her on steroids in the hospital. She responded well, was discharged home on 03/10/17 with follow up appointment in this clinic.  The patient followed up with Dr. Talbert Nan on 03/14/17. The patient reports she has lost her appetite, and has lost 5 lbs in the last week. The patient believes this is caused by Prednisone. She recently tapered this to 60 mg daily. She also reports diarrhea, which may be contributing to her weight loss. She reports she is sleeping well at night. She denies pain. She has almost completed her prescribed course of antibiotics.    CURRENT THERAPY: Surveillance    INTERVAL HISTORY:  Michelle Castillo is here for a follow-up. She presents to the clinic today with her son and mother. She has developed worsening anxiety and depression, shakes a lot, cannot sleep. She called a few days ago and I have stopped her prednisone 2 days ago. She will sleep with ativan and she will eat a little bit. She is losing weight. Her BP is up with anxiety. She does not feel dizzy. Her mother feels she is not herself, her  thought process with depressed. She does not feel she will hurt herself.  She and her mother wonder if she can take off more time from work. She is more concerned with being off and not getting paid. She requests no ore than one week off.  Her mother is stressed due to her daughters current condition.    MEDICAL HISTORY:  Past Medical History:  Diagnosis Date  . ANEMIA, IRON DEFICIENCY   . DIABETES MELLITUS, TYPE II   . Headache(784.0)   . HYPERCHOLESTEROLEMIA   . HYPERTENSION   . Hyperthyroidism    no meds, slightly elevated, MD will recheck in 3 months  . Migraines   . URINARY INCONTINENCE     SURGICAL HISTORY: Past Surgical History:  Procedure Laterality Date  . ABDOMINAL HYSTERECTOMY    . APPENDECTOMY    . BARTHOLIN CYST MARSUPIALIZATION Right 02/22/2017   Procedure: BARTHOLIN CYST MARSUPIALIZATION;  Surgeon: Salvadore Dom, MD;  Location: [facility];  Service: Gynecology;  Laterality: Right;  . CESAREAN SECTION  1998  . CYSTOSCOPY N/A 02/22/2017   Procedure: CYSTOSCOPY;  Surgeon: Salvadore Dom, MD;  Location: [facility];  Service: Gynecology;  Laterality: N/A;  . LAPAROSCOPIC LYSIS OF ADHESIONS N/A 02/22/2017   Procedure: EXTENSIVE LAPAROSCOPIC LYSIS OF ADHESIONS;  Surgeon: Salvadore Dom, MD;  Location: [facility];  Service: Gynecology;  Laterality: N/A;  Dr. Kieth Brightly   . OOPHORECTOMY Left 02/22/2017   Procedure: OOPHORECTOMY;  Surgeon: Salvadore Dom, MD;  Location: [facility];  Service: Gynecology;  Laterality: Left;  . TONSILLECTOMY AND ADENOIDECTOMY    .  TOTAL LAPAROSCOPIC HYSTERECTOMY WITH SALPINGECTOMY Bilateral 02/22/2017   Procedure: HYSTERECTOMY TOTAL LAPAROSCOPIC WITH SALPINGECTOMY;  Surgeon: Salvadore Dom, MD;  Location: [facility];  Service: Gynecology;  Laterality: Bilateral;    SOCIAL HISTORY: Social History   Social History  . Marital status: Married    Spouse name: N/A  . Number of children: 1  . Years of education: N/A   Occupational  History  . Development worker, [employer]    Social History Main Topics  . Smoking status: Never Smoker  . Smokeless tobacco: Never Used  . Alcohol use 0.0 oz/week     Comment: occasionally  . Drug use: No  . Sexual activity: Yes    Partners: Male    Birth control/ protection: Surgical   Other Topics Concern  . Not on file   Social History Narrative   Occupation: Works Orthoptist   Regular exercise-yes          FAMILY HISTORY: Family History  Problem Relation Age of Onset  . Diabetes Father   . Colon cancer Father 68       2002  . Hypertension Father   . Stroke Father   . Heart disease Father        stints  . Diabetes Mother   . Thyroid disease Mother   . Hypertension Mother   . Breast cancer Maternal Aunt   . Breast cancer Maternal Aunt   . Breast cancer Maternal Aunt   . Breast cancer Maternal Aunt   . Breast cancer Maternal Aunt     ALLERGIES:  has No Known Allergies.  MEDICATIONS:  Current Outpatient Prescriptions  Medication Sig Dispense Refill  . amoxicillin-clavulanate (AUGMENTIN) 875-125 MG tablet Take 1 tablet by mouth every 12 (twelve) hours. 20 tablet 0  . citalopram (CELEXA) 20 MG tablet Take 1/2 a tablet a day for one week, if tolerating then increase to one tablet a day 30 tablet 1  . folic acid (FOLVITE) 1 MG tablet Take 1 tablet (1 mg total) by mouth daily. 30 tablet 0  . hydrochlorothiazide (HYDRODIURIL) 25 MG tablet Take 1 tablet (25 mg total) by mouth daily. for high blood pressure 30 tablet 11  . LORazepam (ATIVAN) 1 MG tablet Take 1/2 to 1 tablet at hs prn 10 tablet 0  . metFORMIN (GLUCOPHAGE-XR) 500 MG 24 hr tablet TAKE (2) TABLETS TWICE DAILY. (Patient taking differently: Take 1,000 mg by mouth 2 (two) times daily. ) 120 tablet 11  . pantoprazole (PROTONIX) 40 MG tablet Take 1 tablet (40 mg total) by mouth daily. 30 tablet 1  . potassium chloride SA (K-DUR,KLOR-CON) 20 MEQ tablet Take 1 tablet (20 mEq total) by mouth daily. 30 tablet 0  .  predniSONE (STERAPRED UNI-PAK 48 TAB) 10 MG (48) TBPK tablet Take 4 tab daily for 4 days then 3 tab daily for 4 days then 2 tab daily for 4 days then 1 tab daily for 4 days then 1/2 tab daily for 4 days then stop 48 tablet 0  . SUMAtriptan (IMITREX) 100 MG tablet TAKE 1 TABLET AT ONSET-MAY REPEAT IN 2 HOURS AS NEEDED- NO MORE THAN 2 IN 24 HOURS. (Patient taking differently: Take 100 mg by mouth every 2 (two) hours as needed for migraine. ) 30 tablet 2   No current facility-administered medications for this visit.     REVIEW OF SYSTEMS:  Constitutional: Denies fevers, pain, chills or abnormal night sweats, (+) loss of appetite, (+) 5 lb weight loss (+) shakes/rocking Eyes: Denies blurriness of  vision, double vision or watery eyes Ears, nose, mouth, throat, and face: Denies mucositis or sore throat Respiratory: Denies cough, dyspnea or wheezes Cardiovascular: Denies palpitation, chest discomfort or lower extremity swelling Gastrointestinal:  Denies nausea, heartburn, (+) occasional diarrhea Skin: Denies abnormal skin rashes Lymphatics: Denies new lymphadenopathy or easy bruising Neurological:Denies numbness, tingling or new weaknesses Behavioral/Psych: (+) depression/anxiety  All other systems were reviewed with the patient and are negative.  PHYSICAL EXAMINATION: ECOG PERFORMANCE STATUS: 1 - Symptomatic but completely ambulatory  Vitals:   03/24/17 1604  BP: (!) 140/124  Pulse: (!) 106  Resp: 19  Temp: 98.5 F (36.9 C)   Filed Weights   03/24/17 1604  Weight: 143 lb 1.6 oz (64.9 kg)    GENERAL: alert, no distress and comfortable SKIN: skin color, texture, turgor are normal, no rashes or significant lesions EYES: normal, conjunctiva are pink and non-injected, sclera clear OROPHARYNX: no exudate, no erythema and lips, buccal mucosa, and tongue normal  NECK: supple, thyroid normal size, non-tender, without nodularity LYMPH:  no palpable lymphadenopathy in the cervical, axillary or  inguinal LUNGS: clear to auscultation and percussion with normal breathing effort HEART: regular rate & rhythm and no murmurs and no lower extremity edema ABDOMEN: abdomen soft, non-tender and normal bowel sounds Musculoskeletal: no cyanosis of digits and no clubbing  PSYCH: alert & oriented x 3 with fluent speech NEURO: no focal motor/sensory deficits  LABORATORY DATA:  I have reviewed the data as listed CBC Latest Ref Rng & Units 03/24/2017 03/18/2017 03/14/2017  WBC 3.9 - 10.3 10e3/uL 9.4 15.2(H) 11.9(H)  Hemoglobin 11.6 - 15.9 g/dL 13.1 12.7 11.7  Hematocrit 34.8 - 46.6 % 39.3 38.1 33.6(L)  Platelets 145 - 400 10e3/uL 348 464(H) 441(H)    CMP Latest Ref Rng & Units 03/24/2017 03/18/2017 03/10/2017  Glucose 70 - 140 mg/dl 166(H) 317(H) -  BUN 7.0 - 26.0 mg/dL 14.9 17.6 -  Creatinine 0.6 - 1.1 mg/dL 1.1 1.2(H) -  Sodium 136 - 145 mEq/L 139 134(L) -  Potassium 3.5 - 5.1 mEq/L 3.3(L) 3.8 -  Chloride 101 - 111 mmol/L - - -  CO2 22 - 29 mEq/L 27 25 -  Calcium 8.4 - 10.4 mg/dL 10.0 10.0 -  Total Protein 6.4 - 8.3 g/dL 6.9 7.3 -  Total Bilirubin 0.20 - 1.20 mg/dL 0.66 1.01 1.2  Alkaline Phos 40 - 150 U/L 51 62 -  AST 5 - 34 U/L 9 15 -  ALT 0 - 55 U/L 19 24 -     RADIOGRAPHIC STUDIES: I have personally reviewed the radiological images as listed and agreed with the findings in the report. Ct Abdomen Pelvis W Contrast  Result Date: 03/07/2017 CLINICAL DATA:  History of hysterectomy, elevated white count low hemoglobin EXAM: CT ABDOMEN AND PELVIS WITH CONTRAST TECHNIQUE: Multidetector CT imaging of the abdomen and pelvis was performed using the standard protocol following bolus administration of intravenous contrast. CONTRAST:  165mL ISOVUE-300 IOPAMIDOL (ISOVUE-300) INJECTION 61%, 75mL ISOVUE-300 IOPAMIDOL (ISOVUE-300) INJECTION 61% COMPARISON:  Pelvic ultrasound 01/18/2017 FINDINGS: Lower chest: Coarse calcified left posterior subpleural nodule, measuring 13 mm. No pleural effusion.  Borderline cardiomegaly. Trace pericardial effusion. Hepatobiliary: No focal liver abnormality is seen. No gallstones, gallbladder wall thickening, or biliary dilatation. Pancreas: Unremarkable. No pancreatic ductal dilatation or surrounding inflammatory changes. Spleen: Vague hypodense mass with calcification in the medial aspect of the spleen measuring 4 by 3.6 cm. Adrenals/Urinary Tract: Adrenal glands are unremarkable. Kidneys are normal, without renal calculi, focal lesion, or hydronephrosis. Bladder  is unremarkable. Stomach/Bowel: Stomach is within normal limits. Appendix consistent with history of appendectomy. No evidence of bowel wall thickening, distention, or inflammatory changes. Vascular/Lymphatic: Non aneurysmal aorta. subcentimeter retroperitoneal nodes. Reproductive: Status post hysterectomy. Other: No free air. Gas and fluid collection between the bladder and vaginal cuff, measuring approximately 4.4 by 1 cm. In the right pelvis, there is a 3.2 cm hypodense lesion. Which presumably represents a cyst within the right ovary. Musculoskeletal: No acute or significant osseous findings. IMPRESSION: 1. Status post hysterectomy. Small irregular fluid collection containing small gas bubbles between the bladder and upper vaginal cuff, could represent resolving postop fluid collection however other consideration would be pelvic abscess. 2. 3.2 cm hypodensity in the right pelvis presumably represents a cyst in the right ovary. 3. Indeterminate 4 cm hypodense lesion in the posterior spleen. Recommend nonemergent MRI for further evaluation. 4. 13 mm coarsely calcified left lung base nodule could represent hamartoma or large granuloma Electronically Signed   By: Donavan Foil M.D.   On: 03/07/2017 03:01    ASSESSMENT & PLAN:  No problem-specific Assessment & Plan notes found for this encounter.  Michelle Castillo is a 51 y.o. woman with history of iron deficiency due to menorrhagia, recent hysterectomy on  02/22/2017, and was found to have severe anemia and required hospitalization and multiple blood transfusion on 03/06/2017.  1. Autoimmune hemolytic anemia  - We reviewed the patient's lab results, which is consistent with hemolytic anemia, Coombs test was positive. -She has responded well to steroids, currently on prednisone 60 mg daily, anemia has resolved. Her LDH has decreased normal also previously, which indicating resolving hemolysis She has not required any blood transfusions since she started steroids. - the patient reports Prednisone 60 mg is making her lose her appetite, and causing diarrhea. She has lost 5 lbs recently. -I will taper her Prednisone to 40 mg on 7/23, and decrease 10mg  every 4 days, until 10mg  daily for 4 days then 5mg  daily for 4 days then stop, she was provided with verbal and written instructions. This will taper off completely in the next 20 days. - Her blood glucose is elevated at 317 previously (7/20). The patient may increase her metformin to 1000 mg twice daily as needed to manage her blood glucose. - I previously prescribed folic acid supplement. - She will continue to take prescribed Protonix as needed. -Due to her worsening anxiety and depression, she is not able tolerate the prednisone and will, she stopped on 03/23/2017. -She has been on prednisone for about 2 weeks, we reviewed potential side effects from abrupt stopping of prednisone, such as fatigue, low blood pressure. I encouraged him to drink more fluids and watch her symptoms. -Labs reviewed and her levels have overall resolved. Her CBC remains to be normal, LDH normal, no sign of hemolysis recurrence. Her Cr. Was elevated so I highly suggest she drinks more water so she does not become dehydrated. Her potassium is low so I recommend she take two potassium pills today and go beck to once a day tomorrow. -I'll continue monitor her CBC closely on weekly basis. -Repeat CT Pelvis scan in 2 weeks and f/u     2. History of iron deficiency secondary to menorrhagia -She is status post hysterectomy in June 2018 -She was previously on oral iron. -I will recommend her to resume oral iron, and I'll check her level on next visit.   3. Pelvic fluid collection, rule out abscess -She was previously treated with broad antibiotics in the  hospital, and still on oral Augmentin - I will order a CT pelvis with contrast on 8/6 to follow up the fluid collection in the pelvis. -She is afebrile, no abdominal tenderness.  4. Leukocytosis -Likely reactive, to her hemolytic anemia -WBC is trending down previously, resolved now. -Continue follow-up  5. Depression/Anxiety/Weight loss -Secondary to Steroids -She does not have thoughts of harming herself.  -I will give her Ativan as needed at night. I recommend not to take it in the day as it can make her drowsy -Due to her worsening depression and anxiety, I have stopped her prednisone.  PLAN  -Letter to take off work.  -Labs reviewed today, currently stable, CBC normal, no signs of hemolysis recurrence -Move lab appointment from 8/3 to 8/1  -Please schedule CT scan for 8/1 or 8/3 -Move lab and f/u form 8/10 to 8/8, see me at 4 pm     All questions were answered. The patient knows to call the clinic with any problems, questions or concerns.  I spent 25 minutes counseling the patient face to face. The total time spent in the appointment was 30 minutes and more than 50% was on counseling.  This document serves as a record of services personally performed by Truitt Merle, MD. It was created on her behalf by Joslyn Devon, a trained medical scribe. The creation of this record is based on the scribe's personal observations and the provider's statements to them. This document has been checked and approved by the attending provider.      Truitt Merle, MD 03/24/2017

## 2017-03-25 ENCOUNTER — Telehealth: Payer: Self-pay | Admitting: Obstetrics and Gynecology

## 2017-03-25 ENCOUNTER — Other Ambulatory Visit: Payer: 59

## 2017-03-25 NOTE — Telephone Encounter (Signed)
Reviewed with Clinical Nursing Supervisor. Rx for Ativan prescribed on 03/21/17 by Dr. Talbert Nan, should provide coverage through the weekend. Per review of EPIC, patient seen by Dr. Burr Medico on 03/25/17, will give Ativan as needed at night. No refill for Ativan provided from this office at this time. Encourage patient to contact [place] for refills.    Call to patient, advised to f/u with Dr. Burr Medico for refill of Ativan. Rx for Ativan prescribed on 7/23 should provide coverage through weekend, reviewed admin instructions. Patient calm, denies suicidal/homicidal ideations. Advised patient if symptoms worsen or SI/HI develop, seek care at local ER. Patient verbalizes understanding and is agreeable.   Routing to provider for final review. Patient is agreeable to disposition. Will close encounter.  Cc: Dr. Talbert Nan, [place]

## 2017-03-25 NOTE — Telephone Encounter (Signed)
Spoke with patient. Patient requesting refill of Ativan for anxiety.   Patient states she was seen in office on 03/21/17, started  Celexa 1/2 tab daily and Ativan. Has 4 ativan left. Denies any GYN symptoms or pain. States she is "really anxious and can't calm down, Ativan is the only thing helping". Denies suicidal or homicidal ideations. Patient states prednisone stopped 2 days ago, seen by Dr. Burr Medico yesterday. Patient declines f/u with counselor or psych.   Advised patient per Dr. Janae Sauce notes dated 7/23 -Ativan for sleep just as she is getting on the Celexa, #10, no refills. F/U with Dr. Burr Medico.  Advised patient Dr. Talbert Nan is out of the office, will review with covering provider and return call with recommendations, patient is agreeable.    Dr. Sabra Heck -please review and advise?  Cc: Dr. Talbert Nan

## 2017-03-25 NOTE — Telephone Encounter (Signed)
Patient's son called requesting a refill on Lorazapam for the patient. The DPR on file does not authorize sharing of PHI with anyone but the patient. I requested to speak with the patient and she came on the line. Patient's identity verified. Patient gave verbal permission to speak with her son, Ryanna Teschner, about this request. I suggested to the patient that she may want to update her DPR. Patient expressed understanding. The patient's son said his mom is very anxious due to some medication changes by another doctor and that she'd like more Lorazapam to help with this. He said, "She's just sitting and rocking on the floor."  Pharmacy on file confirmed, if needed.

## 2017-03-28 ENCOUNTER — Telehealth: Payer: Self-pay | Admitting: Hematology

## 2017-03-28 NOTE — ED Provider Notes (Signed)
[facility] Provider Note   CSN: [id number] Arrival date & time: 03/20/17  1926     History   Chief Complaint Chief Complaint  Patient presents with  . Anxiety    HPI Michelle Castillo is a 51 y.o. female.  HPI Pt comes in with cc of anxiety. Pt has a PMHx of diabetes, hyperlipidemia, hypertension, hyperthyroidism, symptomatic anemia status post hysterectomy who was admitted to [facility] on 03/06/17 for treatment of recurrent anemia status post 4 units of PRBCs, subsequently transferred to [facility] upon the request of her hematologist due to what appears to be autoimmune hemolytic anemia. Pt was also given steroids. Family reports that pt is off of work, and suddenly pt has been talking about being laid off, and concerned that she might not be able to provide her for self and family. Pt hasnt slept well the last 3 days and keeps pacing the flood at night time and pt has also withdrawn from others. PT has had thoughts of dying, but she has no active SI/plans and denies HI or hearing voices. No hx of similar symptoms. Pt staying with mother.   Past Medical History:  Diagnosis Date  . ANEMIA, IRON DEFICIENCY   . DIABETES MELLITUS, TYPE II   . Headache(784.0)   . HYPERCHOLESTEROLEMIA   . HYPERTENSION   . Hyperthyroidism    no meds, slightly elevated, MD will recheck in 3 months  . Migraines   . URINARY INCONTINENCE     Patient Active Problem List   Diagnosis Date Noted  . Symptomatic anemia 03/09/2017  . Autoimmune hemolytic anemia ([place]) 03/07/2017  . Leukemoid reaction   . Anemia 03/06/2017  . Status post laparoscopic hysterectomy 02/22/2017  . Hypokalemia 02/14/2017  . Pain in joint, lower leg 03/15/2013  . Encounter for long-term (current) use of other medications 06/23/2012  . Routine general medical examination at a health care facility 06/23/2012  . Screening examination for infectious disease 06/23/2012  . HYPERCHOLESTEROLEMIA 07/30/2010  . Iron  deficiency anemia 07/30/2010  . HEADACHE 03/19/2008  . Hyperthyroidism 07/10/2007  . MYALGIA 07/10/2007  . Diabetes ([place]) 04/06/2007  . Essential hypertension 04/06/2007  . URINARY INCONTINENCE 04/06/2007    Past Surgical History:  Procedure Laterality Date  . ABDOMINAL HYSTERECTOMY    . APPENDECTOMY    . BARTHOLIN CYST MARSUPIALIZATION Right 02/22/2017   Procedure: BARTHOLIN CYST MARSUPIALIZATION;  Surgeon: Salvadore Dom, MD;  Location: [facility];  Service: Gynecology;  Laterality: Right;  . CESAREAN SECTION  1998  . CYSTOSCOPY N/A 02/22/2017   Procedure: CYSTOSCOPY;  Surgeon: Salvadore Dom, MD;  Location: [facility];  Service: Gynecology;  Laterality: N/A;  . LAPAROSCOPIC LYSIS OF ADHESIONS N/A 02/22/2017   Procedure: EXTENSIVE LAPAROSCOPIC LYSIS OF ADHESIONS;  Surgeon: Salvadore Dom, MD;  Location: [facility];  Service: Gynecology;  Laterality: N/A;  Dr. Kieth Brightly   . OOPHORECTOMY Left 02/22/2017   Procedure: OOPHORECTOMY;  Surgeon: Salvadore Dom, MD;  Location: [facility];  Service: Gynecology;  Laterality: Left;  . TONSILLECTOMY AND ADENOIDECTOMY    . TOTAL LAPAROSCOPIC HYSTERECTOMY WITH SALPINGECTOMY Bilateral 02/22/2017   Procedure: HYSTERECTOMY TOTAL LAPAROSCOPIC WITH SALPINGECTOMY;  Surgeon: Salvadore Dom, MD;  Location: [facility];  Service: Gynecology;  Laterality: Bilateral;    OB History    Gravida Para Term Preterm AB Living   2 1     1 1    SAB TAB Ectopic Multiple Live Births     1     1  Home Medications    Prior to Admission medications   Medication Sig Start Date End Date Taking? Authorizing Provider  amoxicillin-clavulanate (AUGMENTIN) 875-125 MG tablet Take 1 tablet by mouth every 12 (twelve) hours. 03/10/17  Yes Rama, Venetia Maxon, MD  folic acid (FOLVITE) 1 MG tablet Take 1 tablet (1 mg total) by mouth daily. 03/18/17  Yes Truitt Merle, MD  hydrochlorothiazide (HYDRODIURIL) 25 MG tablet Take 1 tablet (25 mg total) by mouth daily. for high  blood pressure 06/28/16  Yes Renato Shin, MD  metFORMIN (GLUCOPHAGE-XR) 500 MG 24 hr tablet TAKE (2) TABLETS TWICE DAILY. Patient taking differently: Take 1,000 mg by mouth 2 (two) times daily.  06/28/16  Yes Renato Shin, MD  pantoprazole (PROTONIX) 40 MG tablet Take 1 tablet (40 mg total) by mouth daily. 03/10/17 03/10/18 Yes Rama, Venetia Maxon, MD  potassium chloride SA (K-DUR,KLOR-CON) 20 MEQ tablet Take 1 tablet (20 mEq total) by mouth daily. 02/23/17  Yes Salvadore Dom, MD  SUMAtriptan (IMITREX) 100 MG tablet TAKE 1 TABLET AT ONSET-MAY REPEAT IN 2 HOURS AS NEEDED- NO MORE THAN 2 IN 24 HOURS. Patient taking differently: Take 100 mg by mouth every 2 (two) hours as needed for migraine.  06/28/16  Yes Renato Shin, MD  citalopram (CELEXA) 20 MG tablet Take 1/2 a tablet a day for one week, if tolerating then increase to one tablet a day 03/21/17   Salvadore Dom, MD  LORazepam (ATIVAN) 1 MG tablet Take 1/2 to 1 tablet at hs prn 03/21/17   Salvadore Dom, MD    Family History Family History  Problem Relation Age of Onset  . Diabetes Father   . Colon cancer Father 42       2002  . Hypertension Father   . Stroke Father   . Heart disease Father        stints  . Diabetes Mother   . Thyroid disease Mother   . Hypertension Mother   . Breast cancer Maternal Aunt   . Breast cancer Maternal Aunt   . Breast cancer Maternal Aunt   . Breast cancer Maternal Aunt   . Breast cancer Maternal Aunt     Social History Social History  Substance Use Topics  . Smoking status: Never Smoker  . Smokeless tobacco: Never Used  . Alcohol use 0.0 oz/week     Comment: occasionally     Allergies   Patient has no known allergies.   Review of Systems Review of Systems  Psychiatric/Behavioral: Positive for decreased concentration, dysphoric mood, sleep disturbance and suicidal ideas. Negative for self-injury. The patient is nervous/anxious and is hyperactive.   All other systems  reviewed and are negative.    Physical Exam Updated Vital Signs BP 126/78 (BP Location: Right Arm)   Pulse 78   Temp 98.6 F (37 C) (Oral)   Resp 16   Ht 5\' 7"  (1.702 m)   Wt 67.1 kg (148 lb)   LMP 12/29/2016   SpO2 100%   BMI 23.18 kg/m   Physical Exam  Constitutional: She is oriented to person, place, and time. She appears well-developed.  HENT:  Head: Normocephalic and atraumatic.  Eyes: EOM are normal.  Neck: Normal range of motion. Neck supple.  Cardiovascular: Normal rate.   Pulmonary/Chest: Effort normal and breath sounds normal. No respiratory distress.  Abdominal: Bowel sounds are normal. There is no tenderness.  Musculoskeletal: She exhibits no edema.  Neurological: She is alert and oriented to person, place, and time.  Skin:  Skin is warm and dry.  Psychiatric:  Flat affect  Nursing note and vitals reviewed.    ED Treatments / Results  Labs (all labs ordered are listed, but only abnormal results are displayed) Labs Reviewed - No data to display  EKG  EKG Interpretation None       Radiology No results found.  Procedures Procedures (including critical care time)  Medications Ordered in ED Medications  LORazepam (ATIVAN) injection 2 mg (2 mg Intramuscular Given 03/20/17 2230)  zolpidem (AMBIEN) tablet 5 mg (5 mg Oral Given 03/20/17 2230)     Initial Impression / Assessment and Plan / ED Course  I have reviewed the triage vital signs and the nursing notes.  Pertinent labs & imaging results that were available during my care of the patient were reviewed by me and considered in my medical decision making (see chart for details).     Pt with no known psych hx comes in with waht appears to be manic phase or anxiety. We gave patient some anti-anxiety meds, and pt's family will take her to [facility]. Family is reliable. Pt has no active SI. In fact pt works in Librarian, academic area. Pt denies nausea, emesis, fevers, chills, chest pains, shortness of  breath, headaches, abdominal pain, uti like symptom and the surgical site looks clean.  Strict ER return precautions have been discussed, and patient is agreeing with the plan and is comfortable with the workup done and the recommendations from the ER.    Final Clinical Impressions(s) / ED Diagnoses   Final diagnoses:  [facility])  Anxiety    New Prescriptions Discharge Medication List as of 03/20/2017 11:06 PM       Varney Biles, MD 03/28/17 2053

## 2017-03-28 NOTE — Telephone Encounter (Signed)
lvm to inform pt od date/times for lab/MD/ct appts per sch msg. Informed pt to pick up contrast if she has not done so

## 2017-03-30 ENCOUNTER — Ambulatory Visit (HOSPITAL_COMMUNITY)
Admission: RE | Admit: 2017-03-30 | Discharge: 2017-03-30 | Disposition: A | Discharge status: Home / Self Care | Payer: 59 | Source: Ambulatory Visit | Attending: Hematology | Admitting: Hematology

## 2017-03-30 ENCOUNTER — Other Ambulatory Visit (HOSPITAL_BASED_OUTPATIENT_CLINIC_OR_DEPARTMENT_OTHER): Payer: 59

## 2017-03-30 DIAGNOSIS — D7389 Other diseases of spleen: Secondary | ICD-10-CM | POA: Insufficient documentation

## 2017-03-30 DIAGNOSIS — J929 Pleural plaque without asbestos: Secondary | ICD-10-CM | POA: Diagnosis not present

## 2017-03-30 DIAGNOSIS — N920 Excessive and frequent menstruation with regular cycle: Secondary | ICD-10-CM | POA: Diagnosis not present

## 2017-03-30 DIAGNOSIS — D500 Iron deficiency anemia secondary to blood loss (chronic): Secondary | ICD-10-CM

## 2017-03-30 DIAGNOSIS — R918 Other nonspecific abnormal finding of lung field: Secondary | ICD-10-CM | POA: Diagnosis not present

## 2017-03-30 DIAGNOSIS — R1900 Intra-abdominal and pelvic swelling, mass and lump, unspecified site: Secondary | ICD-10-CM | POA: Diagnosis not present

## 2017-03-30 DIAGNOSIS — D591 Autoimmune hemolytic anemia, unspecified: Secondary | ICD-10-CM

## 2017-03-30 DIAGNOSIS — N739 Female pelvic inflammatory disease, unspecified: Secondary | ICD-10-CM | POA: Insufficient documentation

## 2017-03-30 DIAGNOSIS — K651 Peritoneal abscess: Secondary | ICD-10-CM | POA: Diagnosis not present

## 2017-03-30 LAB — CBC & DIFF AND RETIC
BASO%: 0.9 % (ref 0.0–2.0)
Basophils Absolute: 0.1 10*3/uL (ref 0.0–0.1)
EOS ABS: 0.1 10*3/uL (ref 0.0–0.5)
EOS%: 1.7 % (ref 0.0–7.0)
HCT: 38 % (ref 34.8–46.6)
HEMOGLOBIN: 12.8 g/dL (ref 11.6–15.9)
IMMATURE RETIC FRACT: 1.8 % (ref 1.60–10.00)
LYMPH%: 24 % (ref 14.0–49.7)
MCH: 31.1 pg (ref 25.1–34.0)
MCHC: 33.7 g/dL (ref 31.5–36.0)
MCV: 92.2 fL (ref 79.5–101.0)
MONO#: 0.5 10*3/uL (ref 0.1–0.9)
MONO%: 6.8 % (ref 0.0–14.0)
NEUT#: 4.6 10*3/uL (ref 1.5–6.5)
NEUT%: 66.6 % (ref 38.4–76.8)
Platelets: 227 10*3/uL (ref 145–400)
RBC: 4.12 10*6/uL (ref 3.70–5.45)
RDW: 14.3 % (ref 11.2–14.5)
RETIC CT ABS: 44.08 10*3/uL (ref 33.70–90.70)
Retic %: 1.07 % (ref 0.70–2.10)
WBC: 7 10*3/uL (ref 3.9–10.3)
lymph#: 1.7 10*3/uL (ref 0.9–3.3)

## 2017-03-30 LAB — COMPREHENSIVE METABOLIC PANEL
ALT: 20 U/L (ref 0–55)
ANION GAP: 8 meq/L (ref 3–11)
AST: 11 U/L (ref 5–34)
Albumin: 4.1 g/dL (ref 3.5–5.0)
Alkaline Phosphatase: 44 U/L (ref 40–150)
BILIRUBIN TOTAL: 0.49 mg/dL (ref 0.20–1.20)
BUN: 9.9 mg/dL (ref 7.0–26.0)
CO2: 30 meq/L — AB (ref 22–29)
CREATININE: 0.9 mg/dL (ref 0.6–1.1)
Calcium: 10.1 mg/dL (ref 8.4–10.4)
Chloride: 104 mEq/L (ref 98–109)
EGFR: 88 mL/min/{1.73_m2} — ABNORMAL LOW (ref >90)
GLUCOSE: 103 mg/dL (ref 70–140)
Potassium: 3.5 mEq/L (ref 3.5–5.1)
Sodium: 141 mEq/L (ref 136–145)
TOTAL PROTEIN: 6.7 g/dL (ref 6.4–8.3)

## 2017-03-30 LAB — LACTATE DEHYDROGENASE: LDH: 125 U/L (ref 125–245)

## 2017-03-30 MED ORDER — IOPAMIDOL (ISOVUE-300) INJECTION 61%
INTRAVENOUS | Status: AC
  Filled 2017-03-30: qty 100

## 2017-03-30 MED ORDER — IOPAMIDOL (ISOVUE-300) INJECTION 61%
100.0000 mL | Freq: Once | INTRAVENOUS | Status: AC | PRN
  Administered 2017-03-30: 100 mL via INTRAVENOUS

## 2017-04-01 ENCOUNTER — Other Ambulatory Visit: Payer: 59

## 2017-04-01 ENCOUNTER — Telehealth: Payer: Self-pay | Admitting: *Deleted

## 2017-04-01 NOTE — Telephone Encounter (Signed)
-----   Message from Truitt Merle, MD sent at 03/31/2017  8:21 AM EDT ----- Please let pt know lab results, no anemia or evidence of hemolysis, I have no concerns. Thanks  Truitt Merle 03/31/2017

## 2017-04-01 NOTE — Telephone Encounter (Signed)
Called pt & informed of good lab results per Dr Ernestina Penna message.  Pt expressed understanding & had no questions.

## 2017-04-06 ENCOUNTER — Ambulatory Visit (HOSPITAL_BASED_OUTPATIENT_CLINIC_OR_DEPARTMENT_OTHER): Payer: 59 | Admitting: Hematology

## 2017-04-06 ENCOUNTER — Encounter: Payer: Self-pay | Admitting: *Deleted

## 2017-04-06 ENCOUNTER — Other Ambulatory Visit (HOSPITAL_BASED_OUTPATIENT_CLINIC_OR_DEPARTMENT_OTHER): Payer: 59

## 2017-04-06 ENCOUNTER — Encounter: Payer: Self-pay | Admitting: Hematology

## 2017-04-06 VITALS — BP 140/89 | HR 83 | Temp 97.9°F | Resp 18 | Ht 67.0 in | Wt 145.0 lb

## 2017-04-06 DIAGNOSIS — I10 Essential (primary) hypertension: Secondary | ICD-10-CM

## 2017-04-06 DIAGNOSIS — D591 Autoimmune hemolytic anemia, unspecified: Secondary | ICD-10-CM

## 2017-04-06 DIAGNOSIS — F419 Anxiety disorder, unspecified: Secondary | ICD-10-CM | POA: Insufficient documentation

## 2017-04-06 DIAGNOSIS — N920 Excessive and frequent menstruation with regular cycle: Secondary | ICD-10-CM

## 2017-04-06 DIAGNOSIS — D72829 Elevated white blood cell count, unspecified: Secondary | ICD-10-CM

## 2017-04-06 DIAGNOSIS — F329 Major depressive disorder, single episode, unspecified: Secondary | ICD-10-CM

## 2017-04-06 DIAGNOSIS — D500 Iron deficiency anemia secondary to blood loss (chronic): Secondary | ICD-10-CM

## 2017-04-06 LAB — CBC & DIFF AND RETIC
BASO%: 0.4 % (ref 0.0–2.0)
BASOS ABS: 0 10*3/uL (ref 0.0–0.1)
EOS%: 1 % (ref 0.0–7.0)
Eosinophils Absolute: 0.1 10*3/uL (ref 0.0–0.5)
HEMATOCRIT: 37.7 % (ref 34.8–46.6)
HGB: 12.7 g/dL (ref 11.6–15.9)
Immature Retic Fract: 2 % (ref 1.60–10.00)
LYMPH%: 23.7 % (ref 14.0–49.7)
MCH: 30.8 pg (ref 25.1–34.0)
MCHC: 33.7 g/dL (ref 31.5–36.0)
MCV: 91.5 fL (ref 79.5–101.0)
MONO#: 0.5 10*3/uL (ref 0.1–0.9)
MONO%: 7.4 % (ref 0.0–14.0)
NEUT#: 4.7 10*3/uL (ref 1.5–6.5)
NEUT%: 67.5 % (ref 38.4–76.8)
PLATELETS: 247 10*3/uL (ref 145–400)
RBC: 4.12 10*6/uL (ref 3.70–5.45)
RDW: 14 % (ref 11.2–14.5)
Retic %: 1.26 % (ref 0.70–2.10)
Retic Ct Abs: 51.91 10*3/uL (ref 33.70–90.70)
WBC: 7 10*3/uL (ref 3.9–10.3)
lymph#: 1.7 10*3/uL (ref 0.9–3.3)

## 2017-04-06 LAB — COMPREHENSIVE METABOLIC PANEL
ALBUMIN: 4.2 g/dL (ref 3.5–5.0)
ALK PHOS: 44 U/L (ref 40–150)
ALT: 16 U/L (ref 0–55)
AST: 11 U/L (ref 5–34)
Anion Gap: 10 mEq/L (ref 3–11)
BILIRUBIN TOTAL: 0.51 mg/dL (ref 0.20–1.20)
BUN: 8.6 mg/dL (ref 7.0–26.0)
CO2: 27 mEq/L (ref 22–29)
Calcium: 10.3 mg/dL (ref 8.4–10.4)
Chloride: 104 mEq/L (ref 98–109)
Creatinine: 0.9 mg/dL (ref 0.6–1.1)
EGFR: 84 mL/min/{1.73_m2} — AB (ref >90)
Glucose: 97 mg/dl (ref 70–140)
POTASSIUM: 3.6 meq/L (ref 3.5–5.1)
Sodium: 141 mEq/L (ref 136–145)
Total Protein: 6.9 g/dL (ref 6.4–8.3)

## 2017-04-06 LAB — LACTATE DEHYDROGENASE: LDH: 138 U/L (ref 125–245)

## 2017-04-06 MED ORDER — LORAZEPAM 0.5 MG PO TABS: 0.5000 mg | ORAL_TABLET | Freq: Two times a day (BID) | ORAL | 0 refills | Status: DC | PRN

## 2017-04-06 NOTE — Progress Notes (Signed)
[place]  Telephone:([phone]) [phone] Fax:([phone]) [phone]  Clinic follow up Note   Patient Care Team: Renato Shin, MD as PCP - General Regina Eck, CNM as Referring Physician (Certified Nurse Midwife) 04/06/2017  CHIEF COMPLAINTS:  Follow up autoimmune hemolytic anemia   HISTORY OF PRESENTING ILLNESS:  Michelle Castillo 51 y.o. female is here because of hemolytic anemia.  The patient was recently hospitalized from 03/06/17 - 03/10/17 for autoimmune hemolytic anemia. She underwent hysterectomy on 02/22/17 because of symptomatic fibroid uterus with menorrhagia leading to anemia, and was feeling well until 7/8 when she experienced extreme fatigue. She was originally admitted to [facility], but was transferred to [facility] on 03/08/17. I initially consulted with her as an inpatient on 03/09/17. She was diagnosed with autoimmune hemolytic anemia, and I started her on steroids in the hospital. She responded well, was discharged home on 03/10/17 with follow up appointment in this clinic.  The patient followed up with Dr. Talbert Nan on 03/14/17. The patient reports she has lost her appetite, and has lost 5 lbs in the last week. The patient believes this is caused by Prednisone. She recently tapered this to 60 mg daily. She also reports diarrhea, which may be contributing to her weight loss. She reports she is sleeping well at night. She denies pain. She has almost completed her prescribed course of antibiotics.    CURRENT THERAPY: observation    INTERVAL HISTORY:  Michelle Castillo is here for a follow-up. She presents to the clinic today with her son and mother. She reports to still feeling anxious even without steroids. It made a little bit of difference. She recently has a anxiety episode. She sleeps alright. Her mother reports to moving in with her and she will break down. She ran out of ativan and she was not able to get refill. It did help her according to her mother.   MEDICAL  HISTORY:  Past Medical History:  Diagnosis Date  . ANEMIA, IRON DEFICIENCY   . DIABETES MELLITUS, TYPE II   . Headache(784.0)   . HYPERCHOLESTEROLEMIA   . HYPERTENSION   . Hyperthyroidism    no meds, slightly elevated, MD will recheck in 3 months  . Migraines   . URINARY INCONTINENCE     SURGICAL HISTORY: Past Surgical History:  Procedure Laterality Date  . ABDOMINAL HYSTERECTOMY    . APPENDECTOMY    . BARTHOLIN CYST MARSUPIALIZATION Right 02/22/2017   Procedure: BARTHOLIN CYST MARSUPIALIZATION;  Surgeon: Salvadore Dom, MD;  Location: [facility];  Service: Gynecology;  Laterality: Right;  . CESAREAN SECTION  1998  . CYSTOSCOPY N/A 02/22/2017   Procedure: CYSTOSCOPY;  Surgeon: Salvadore Dom, MD;  Location: [facility];  Service: Gynecology;  Laterality: N/A;  . LAPAROSCOPIC LYSIS OF ADHESIONS N/A 02/22/2017   Procedure: EXTENSIVE LAPAROSCOPIC LYSIS OF ADHESIONS;  Surgeon: Salvadore Dom, MD;  Location: [facility];  Service: Gynecology;  Laterality: N/A;  Dr. Kieth Brightly   . OOPHORECTOMY Left 02/22/2017   Procedure: OOPHORECTOMY;  Surgeon: Salvadore Dom, MD;  Location: [facility];  Service: Gynecology;  Laterality: Left;  . TONSILLECTOMY AND ADENOIDECTOMY    . TOTAL LAPAROSCOPIC HYSTERECTOMY WITH SALPINGECTOMY Bilateral 02/22/2017   Procedure: HYSTERECTOMY TOTAL LAPAROSCOPIC WITH SALPINGECTOMY;  Surgeon: Salvadore Dom, MD;  Location: [facility];  Service: Gynecology;  Laterality: Bilateral;    SOCIAL HISTORY: Social History   Social History  . Marital status: Married    Spouse name: N/A  . Number of children: 1  .  Years of education: N/A   Occupational History  . Development worker, [employer] aid    Social History Main Topics  . Smoking status: Never Smoker  . Smokeless tobacco: Never Used  . Alcohol use 0.0 oz/week     Comment: occasionally  . Drug use: No  . Sexual activity: Yes    Partners: Male    Birth control/ protection: Surgical   Other Topics Concern  . Not on  file   Social History Narrative   Occupation: Works Orthoptist   Regular exercise-yes          FAMILY HISTORY: Family History  Problem Relation Age of Onset  . Diabetes Father   . Colon cancer Father 15       2002  . Hypertension Father   . Stroke Father   . Heart disease Father        stints  . Diabetes Mother   . Thyroid disease Mother   . Hypertension Mother   . Breast cancer Maternal Aunt   . Breast cancer Maternal Aunt   . Breast cancer Maternal Aunt   . Breast cancer Maternal Aunt   . Breast cancer Maternal Aunt     ALLERGIES:  has No Known Allergies.  MEDICATIONS:  Current Outpatient Prescriptions  Medication Sig Dispense Refill  . citalopram (CELEXA) 20 MG tablet Take 1/2 a tablet a day for one week, if tolerating then increase to one tablet a day 30 tablet 1  . folic acid (FOLVITE) 1 MG tablet Take 1 tablet (1 mg total) by mouth daily. 30 tablet 0  . hydrochlorothiazide (HYDRODIURIL) 25 MG tablet Take 1 tablet (25 mg total) by mouth daily. for high blood pressure 30 tablet 11  . metFORMIN (GLUCOPHAGE-XR) 500 MG 24 hr tablet TAKE (2) TABLETS TWICE DAILY. (Patient taking differently: Take 1,000 mg by mouth 2 (two) times daily. ) 120 tablet 11  . potassium chloride SA (K-DUR,KLOR-CON) 20 MEQ tablet Take 1 tablet (20 mEq total) by mouth daily. 30 tablet 0  . LORazepam (ATIVAN) 0.5 MG tablet Take 1 tablet (0.5 mg total) by mouth every 12 (twelve) hours as needed for anxiety. 20 tablet 0  . LORazepam (ATIVAN) 1 MG tablet Take 1/2 to 1 tablet at hs prn (Patient not taking: Reported on 04/06/2017) 10 tablet 0  . pantoprazole (PROTONIX) 40 MG tablet Take 1 tablet (40 mg total) by mouth daily. (Patient not taking: Reported on 04/06/2017) 30 tablet 1  . SUMAtriptan (IMITREX) 100 MG tablet TAKE 1 TABLET AT ONSET-MAY REPEAT IN 2 HOURS AS NEEDED- NO MORE THAN 2 IN 24 HOURS. (Patient not taking: Reported on 04/06/2017) 30 tablet 2   No current facility-administered medications  for this visit.     REVIEW OF SYSTEMS:  Constitutional: Denies fevers, pain, chills or abnormal night sweats, (+) loss of appetite, (+) 5 lb weight loss (+) shakes/rocking Eyes: Denies blurriness of vision, double vision or watery eyes Ears, nose, mouth, throat, and face: Denies mucositis or sore throat Respiratory: Denies cough, dyspnea or wheezes Cardiovascular: Denies palpitation, chest discomfort or lower extremity swelling Gastrointestinal:  Denies nausea, heartburn, (+) occasional diarrhea Skin: Denies abnormal skin rashes Lymphatics: Denies new lymphadenopathy or easy bruising Neurological:Denies numbness, tingling or new weaknesses Behavioral/Psych: (+) depression/anxiety  All other systems were reviewed with the patient and are negative.  PHYSICAL EXAMINATION: ECOG PERFORMANCE STATUS: 1 - Symptomatic but completely ambulatory  Vitals:   04/06/17 1416  BP: 140/89  Pulse: 83  Resp: 18  Temp: 97.9 F (36.6  C)   Filed Weights   04/06/17 1416  Weight: 145 lb (65.8 kg)   GENERAL: alert, no distress and comfortable SKIN: skin color, texture, turgor are normal, no rashes or significant lesions EYES: normal, conjunctiva are pink and non-injected, sclera clear OROPHARYNX: no exudate, no erythema and lips, buccal mucosa, and tongue normal  NECK: supple, thyroid normal size, non-tender, without nodularity LYMPH:  no palpable lymphadenopathy in the cervical, axillary or inguinal LUNGS: clear to auscultation and percussion with normal breathing effort HEART: regular rate & rhythm and no murmurs and no lower extremity edema ABDOMEN: abdomen soft, non-tender and normal bowel sounds Musculoskeletal: no cyanosis of digits and no clubbing  PSYCH: alert & oriented x 3 with fluent speech NEURO: no focal motor/sensory deficits  LABORATORY DATA:  I have reviewed the data as listed CBC Latest Ref Rng & Units 04/06/2017 03/30/2017 03/24/2017  WBC 3.9 - 10.3 10e3/uL 7.0 7.0 9.4  Hemoglobin  11.6 - 15.9 g/dL 12.7 12.8 13.1  Hematocrit 34.8 - 46.6 % 37.7 38.0 39.3  Platelets 145 - 400 10e3/uL 247 227 348    CMP Latest Ref Rng & Units 04/06/2017 03/30/2017 03/24/2017  Glucose 70 - 140 mg/dl 97 103 166(H)  BUN 7.0 - 26.0 mg/dL 8.6 9.9 14.9  Creatinine 0.6 - 1.1 mg/dL 0.9 0.9 1.1  Sodium 136 - 145 mEq/L 141 141 139  Potassium 3.5 - 5.1 mEq/L 3.6 3.5 3.3(L)  Chloride 101 - 111 mmol/L - - -  CO2 22 - 29 mEq/L 27 30(H) 27  Calcium 8.4 - 10.4 mg/dL 10.3 10.1 10.0  Total Protein 6.4 - 8.3 g/dL 6.9 6.7 6.9  Total Bilirubin 0.20 - 1.20 mg/dL 0.51 0.49 0.66  Alkaline Phos 40 - 150 U/L 44 44 51  AST 5 - 34 U/L 11 11 9   ALT 0 - 55 U/L 16 20 19      RADIOGRAPHIC STUDIES: I have personally reviewed the radiological images as listed and agreed with the findings in the report. Ct Abdomen Pelvis W Contrast  Result Date: 03/31/2017 CLINICAL DATA:  51 year old female with history of splenic lesion and pelvic abscess. Followup study. EXAM: CT ABDOMEN AND PELVIS WITH CONTRAST TECHNIQUE: Multidetector CT imaging of the abdomen and pelvis was performed using the standard protocol following bolus administration of intravenous contrast. CONTRAST:  192mL ISOVUE-300 IOPAMIDOL (ISOVUE-300) INJECTION 61% COMPARISON:  CT the abdomen and pelvis 03/07/2017. FINDINGS: Lower chest: Multiple calcified pleural plaques are noted in the lower thorax bilaterally, indicative of asbestos related pleural disease. Several pulmonary nodules are noted in the visualized lung bases, largest of which measures only 3 mm in the right middle lobe (axial image 19 of series 6). Hepatobiliary: Small capsular calcification overlying segment 4B of the liver, unchanged, likely related to remote trauma. No cystic or solid hepatic lesions. No intra or extrahepatic biliary ductal dilatation. Gallbladder is nearly decompressed, but otherwise unremarkable in appearance. Pancreas: No pancreatic mass. No pancreatic ductal dilatation. No pancreatic  or peripancreatic fluid or inflammatory changes. Spleen: Partially calcified hypovascular lesion in the medial aspect of the spleen is similar to slightly smaller than the prior examination, currently measuring 4.1 x 2.7 cm. This lesion is less distinct on delayed images, suggesting some progressive enhancement as can be seen with a hemangioma. Adrenals/Urinary Tract: Bilateral kidneys and bilateral adrenal glands are normal in appearance. No hydroureteronephrosis. Urinary bladder is normal in appearance. Stomach/Bowel: Stomach is normal in appearance. No pathologic dilatation of small bowel or colon. Status post appendectomy. Vascular/Lymphatic: No significant atherosclerotic  disease, aneurysm or dissection noted in the abdominal or pelvic vasculature. Some calcified mesenteric lymph nodes are noted. No other lymphadenopathy noted in the abdomen or pelvis. Reproductive: Status post hysterectomy. Previously noted small fluid and gas containing collection at or adjacent to the vaginal apex has resolved. No definite pelvic abscess. Well-defined 3.1 x 4.0 x 3.1 cm low-attenuation lesion in the right adnexal lesion, similar to the prior study, presumably an ovarian cyst. Other: No significant volume of ascites.  No pneumoperitoneum. Musculoskeletal: There are no aggressive appearing lytic or blastic lesions noted in the visualized portions of the skeleton. IMPRESSION: 1. Previously noted gas and fluid containing collection at or near the vaginal apex on prior study 03/07/2017 is no longer identified. No pelvic abscess noted on today's study. 2. Indeterminate hypovascular partially calcified splenic lesion in the medial aspect of the spleen, stable to slightly decreased in size compared to the prior study. This is favored to be benign, likely a hemangioma, and could be further characterized with nonemergent MRI of the abdomen with and without IV gadolinium if of clinical concern. 3. Stable appearance of low-attenuation  lesion in the right adnexal region, presumably an ovarian cyst. 4. Calcified pleural plaques in the lower thorax bilaterally indicative of asbestos related pleural disease. 5. Tiny pulmonary nodules in the visualize lung bases measuring 3 mm or less in size. This is nonspecific, but statistically likely benign. No follow-up needed if patient is low-risk (and has no known or suspected primary neoplasm). Non-contrast chest CT can be considered in 12 months if patient is high-risk. This recommendation follows the consensus statement: Guidelines for Management of Incidental Pulmonary Nodules Detected on CT Images: From the Fleischner Society 2017; Radiology 2017; 284:228-243. Electronically Signed   By: Vinnie Langton M.D.   On: 03/31/2017 10:17    ASSESSMENT & PLAN:  No problem-specific Assessment & Plan notes found for this encounter.  Michelle Castillo is a 51 y.o. woman with history of iron deficiency due to menorrhagia, recent hysterectomy on 02/22/2017, and was found to have severe anemia and required hospitalization and multiple blood transfusion on 03/06/2017.  1. Autoimmune hemolytic anemia  - We reviewed the patient's lab results, which is consistent with hemolytic anemia, Coombs test was positive. -She has responded well to steroids, anemia resolved. -Due to her worsening anxiety and depression, she was not able tolerate the prednisone and stopped on 03/23/2017. She was on about 15 days  -Labs reviewed. Her CBC remains to be normal, LDH normal, no sign of hemolysis recurrence.  -plan to monitor CBC monthly and f/u in 3 months   2. Depression/Anxiety/Weight loss triggered by steroids, but persistent since she stopped prednisone 2 weeks ago  -She does not have thoughts of harming herself.  -I refilled her Ativan as needed at night. I recommend not to take it in the day as it can make her drowsy -she will continue celexa  -I asked our SW to meet her and counseling her today  -I will refer her  to psychiatry   3. History of iron deficiency secondary to menorrhagia -She is status post hysterectomy in June 2018 -She was previously on oral iron. -resolved now   3. Pelvic fluid collection, rule out abscess -She was previously treated with broad antibiotics -I reviewed her repeated CT scan from 03/31/2018, this is resolved now   4. Leukocytosis -Likely reactive, to her hemolytic anemia - resolved now.   PLAN  -I refill ativan only as needed no more than twice a day.  -  I wrote a letter for her to work from home until next Friday   -Labs reviewed today, currently stable, CBC normal, no signs of hemolysis recurrence -repeat lab every 4 weeks -f/u in 3 months  -psychiatry referral    All questions were answered. The patient knows to call the clinic with any problems, questions or concerns.  I spent 25 minutes counseling the patient face to face. The total time spent in the appointment was 30 minutes and more than 50% was on counseling.  This document serves as a record of services personally performed by Truitt Merle, MD. It was created on her behalf by Joslyn Devon, a trained medical scribe. The creation of this record is based on the scribe's personal observations and the provider's statements to them. This document has been checked and approved by the attending provider.      Truitt Merle, MD 04/06/2017

## 2017-04-06 NOTE — Progress Notes (Signed)
Open in error

## 2017-04-07 NOTE — Progress Notes (Signed)
[place] Work  Clinical Social Work was referred by Futures trader for assessment of psychosocial needs and support due to anxiety.  Clinical Social Worker met with patient at [facility] to offer support and assess for needs. Pt was at appointment with her mother and son. Pt visibly anxious and describes common anxiety symptoms. She reports this is new to her. CSW and pt explored possible triggers of anxiety and additional coping techniques. She identified several coing techniques that might assist when she becomes anxious. She was provided with a list of counseling options in the local community and encouraged to further get support for this new concern. She is open to support and plans to follow up with psych referral provided by Dr. Burr Medico and counseling providers.   Clinical Social Work interventions:  Supportive counseling  Education and referral  Loren Racer, LCSW, OSW-C Clinical Social Worker [place]  [place] Phone: [phone] Fax: [phone]

## 2017-04-08 ENCOUNTER — Telehealth: Payer: Self-pay | Admitting: Hematology

## 2017-04-08 ENCOUNTER — Other Ambulatory Visit: Payer: 59

## 2017-04-08 ENCOUNTER — Ambulatory Visit: Payer: 59 | Admitting: Hematology

## 2017-04-08 NOTE — Telephone Encounter (Signed)
Scheduled appt per 8/8 sch message - patient is aware of appts added and will confirm with my chart.

## 2017-04-11 ENCOUNTER — Other Ambulatory Visit: Payer: Self-pay | Admitting: Hematology

## 2017-04-11 DIAGNOSIS — F419 Anxiety disorder, unspecified: Principal | ICD-10-CM

## 2017-04-11 DIAGNOSIS — F329 Major depressive disorder, single episode, unspecified: Secondary | ICD-10-CM

## 2017-04-18 ENCOUNTER — Encounter: Payer: Self-pay | Admitting: Obstetrics and Gynecology

## 2017-04-18 ENCOUNTER — Ambulatory Visit (INDEPENDENT_AMBULATORY_CARE_PROVIDER_SITE_OTHER): Payer: 59 | Admitting: Obstetrics and Gynecology

## 2017-04-18 VITALS — BP 130/80 | HR 84 | Resp 14 | Wt 147.0 lb

## 2017-04-18 DIAGNOSIS — F325 Major depressive disorder, single episode, in full remission: Secondary | ICD-10-CM

## 2017-04-18 NOTE — Progress Notes (Signed)
GYNECOLOGY  VISIT   HPI: 51 y.o.   Married  [place] American  female   [phone] with Patient's last menstrual period was 12/29/2016.   here for follow up anxiety and depression. Her depression and anxiety started after she was hospitalized with hemolytic anemia and was on steroids. Her anemia has resolved. Her depression and anxiety are gone. She isn't taking the ativan or celexa that was previously prescribed. She is feeling like herself again. She started back at work today full time (was doing some work from home).   GYNECOLOGIC HISTORY: Patient's last menstrual period was 12/29/2016. Contraception:hysterectomy  Menopausal hormone therapy: none         OB History    Gravida Para Term Preterm AB Living   2 1     1 1    SAB TAB Ectopic Multiple Live Births     1     1         Patient Active Problem List   Diagnosis Date Noted  . Anxiety and depression 04/06/2017  . Symptomatic anemia 03/09/2017  . Autoimmune hemolytic anemia ([place]) 03/07/2017  . Leukemoid reaction   . Anemia 03/06/2017  . Status post laparoscopic hysterectomy 02/22/2017  . Hypokalemia 02/14/2017  . Pain in joint, lower leg 03/15/2013  . Encounter for long-term (current) use of other medications 06/23/2012  . Routine general medical examination at a health care facility 06/23/2012  . Screening examination for infectious disease 06/23/2012  . HYPERCHOLESTEROLEMIA 07/30/2010  . Iron deficiency anemia 07/30/2010  . HEADACHE 03/19/2008  . Hyperthyroidism 07/10/2007  . MYALGIA 07/10/2007  . Diabetes ([place]) 04/06/2007  . Essential hypertension 04/06/2007  . URINARY INCONTINENCE 04/06/2007    Past Medical History:  Diagnosis Date  . ANEMIA, IRON DEFICIENCY   . DIABETES MELLITUS, TYPE II   . Headache(784.0)   . HYPERCHOLESTEROLEMIA   . HYPERTENSION   . Hyperthyroidism    no meds, slightly elevated, MD will recheck in 3 months  . Migraines   . URINARY INCONTINENCE     Past Surgical History:  Procedure  Laterality Date  . ABDOMINAL HYSTERECTOMY    . APPENDECTOMY    . BARTHOLIN CYST MARSUPIALIZATION Right 02/22/2017   Procedure: BARTHOLIN CYST MARSUPIALIZATION;  Surgeon: Salvadore Dom, MD;  Location: [facility];  Service: Gynecology;  Laterality: Right;  . CESAREAN SECTION  1998  . CYSTOSCOPY N/A 02/22/2017   Procedure: CYSTOSCOPY;  Surgeon: Salvadore Dom, MD;  Location: [facility];  Service: Gynecology;  Laterality: N/A;  . LAPAROSCOPIC LYSIS OF ADHESIONS N/A 02/22/2017   Procedure: EXTENSIVE LAPAROSCOPIC LYSIS OF ADHESIONS;  Surgeon: Salvadore Dom, MD;  Location: [facility];  Service: Gynecology;  Laterality: N/A;  Dr. Kieth Brightly   . OOPHORECTOMY Left 02/22/2017   Procedure: OOPHORECTOMY;  Surgeon: Salvadore Dom, MD;  Location: [facility];  Service: Gynecology;  Laterality: Left;  . TONSILLECTOMY AND ADENOIDECTOMY    . TOTAL LAPAROSCOPIC HYSTERECTOMY WITH SALPINGECTOMY Bilateral 02/22/2017   Procedure: HYSTERECTOMY TOTAL LAPAROSCOPIC WITH SALPINGECTOMY;  Surgeon: Salvadore Dom, MD;  Location: [facility];  Service: Gynecology;  Laterality: Bilateral;    Current Outpatient Prescriptions  Medication Sig Dispense Refill  . citalopram (CELEXA) 20 MG tablet Take 1/2 a tablet a day for one week, if tolerating then increase to one tablet a day 30 tablet 1  . folic acid (FOLVITE) 1 MG tablet Take 1 tablet (1 mg total) by mouth daily. 30 tablet 0  . hydrochlorothiazide (HYDRODIURIL) 25 MG tablet Take 1 tablet (25 mg total)  by mouth daily. for high blood pressure 30 tablet 11  . LORazepam (ATIVAN) 0.5 MG tablet Take 1 tablet (0.5 mg total) by mouth every 12 (twelve) hours as needed for anxiety. 20 tablet 0  . metFORMIN (GLUCOPHAGE-XR) 500 MG 24 hr tablet TAKE (2) TABLETS TWICE DAILY. (Patient taking differently: Take 1,000 mg by mouth 2 (two) times daily. ) 120 tablet 11  . pantoprazole (PROTONIX) 40 MG tablet Take 1 tablet (40 mg total) by mouth daily. 30 tablet 1  . potassium chloride SA  (K-DUR,KLOR-CON) 20 MEQ tablet Take 1 tablet (20 mEq total) by mouth daily. 30 tablet 0  . SUMAtriptan (IMITREX) 100 MG tablet TAKE 1 TABLET AT ONSET-MAY REPEAT IN 2 HOURS AS NEEDED- NO MORE THAN 2 IN 24 HOURS. 30 tablet 2   No current facility-administered medications for this visit.      ALLERGIES: Patient has no known allergies.  Family History  Problem Relation Age of Onset  . Diabetes Father   . Colon cancer Father 10       2002  . Hypertension Father   . Stroke Father   . Heart disease Father        stints  . Diabetes Mother   . Thyroid disease Mother   . Hypertension Mother   . Breast cancer Maternal Aunt   . Breast cancer Maternal Aunt   . Breast cancer Maternal Aunt   . Breast cancer Maternal Aunt   . Breast cancer Maternal Aunt     Social History   Social History  . Marital status: Married    Spouse name: N/A  . Number of children: 1  . Years of education: N/A   Occupational History  . Development worker, international aid    Social History Main Topics  . Smoking status: Never Smoker  . Smokeless tobacco: Never Used  . Alcohol use 0.0 oz/week     Comment: occasionally  . Drug use: No  . Sexual activity: Yes    Partners: Male    Birth control/ protection: Surgical   Other Topics Concern  . Not on file   Social History Narrative   Occupation: Works Orthoptist   Regular exercise-yes          Review of Systems  Constitutional: Negative.   HENT: Negative.   Eyes: Negative.   Respiratory: Negative.   Cardiovascular: Negative.   Gastrointestinal: Negative.   Genitourinary: Negative.   Musculoskeletal: Negative.   Skin: Negative.   Neurological: Negative.   Endo/Heme/Allergies: Negative.   Psychiatric/Behavioral: Negative.     PHYSICAL EXAMINATION:    BP 130/80 (BP Location: Right Arm, Patient Position: Sitting, Cuff Size: Normal)   Pulse 84   Resp 14   Wt 147 lb (66.7 kg)   LMP 12/29/2016   BMI 23.02 kg/m     General appearance: alert,  cooperative and appears stated age.   ASSESSMENT Episode of depression and anxiety, started on steroids, now resolved. Not on any medication    PLAN Routine f/u She will f/u in 11/18 for an annual exam   An After Visit Summary was printed and given to the patient.

## 2017-04-22 ENCOUNTER — Other Ambulatory Visit: Payer: Self-pay | Admitting: Hematology

## 2017-04-22 DIAGNOSIS — F32A Depression, unspecified: Secondary | ICD-10-CM

## 2017-04-22 DIAGNOSIS — F419 Anxiety disorder, unspecified: Principal | ICD-10-CM

## 2017-04-22 DIAGNOSIS — F329 Major depressive disorder, single episode, unspecified: Secondary | ICD-10-CM

## 2017-05-04 ENCOUNTER — Other Ambulatory Visit (HOSPITAL_BASED_OUTPATIENT_CLINIC_OR_DEPARTMENT_OTHER): Payer: 59

## 2017-05-04 DIAGNOSIS — D591 Autoimmune hemolytic anemia, unspecified: Secondary | ICD-10-CM

## 2017-05-04 DIAGNOSIS — N920 Excessive and frequent menstruation with regular cycle: Secondary | ICD-10-CM | POA: Diagnosis not present

## 2017-05-04 DIAGNOSIS — D500 Iron deficiency anemia secondary to blood loss (chronic): Secondary | ICD-10-CM | POA: Diagnosis not present

## 2017-05-04 LAB — IRON AND TIBC
%SAT: 29 % (ref 21–57)
IRON: 70 ug/dL (ref 41–142)
TIBC: 240 ug/dL (ref 236–444)
UIBC: 170 ug/dL (ref 120–384)

## 2017-05-04 LAB — CBC & DIFF AND RETIC
BASO%: 0.7 % (ref 0.0–2.0)
BASOS ABS: 0.1 10*3/uL (ref 0.0–0.1)
EOS%: 1.5 % (ref 0.0–7.0)
Eosinophils Absolute: 0.1 10*3/uL (ref 0.0–0.5)
HEMATOCRIT: 37.5 % (ref 34.8–46.6)
HGB: 12.6 g/dL (ref 11.6–15.9)
Immature Retic Fract: 3.1 % (ref 1.60–10.00)
LYMPH%: 23.5 % (ref 14.0–49.7)
MCH: 30.4 pg (ref 25.1–34.0)
MCHC: 33.6 g/dL (ref 31.5–36.0)
MCV: 90.4 fL (ref 79.5–101.0)
MONO#: 0.6 10*3/uL (ref 0.1–0.9)
MONO%: 7.6 % (ref 0.0–14.0)
NEUT%: 66.7 % (ref 38.4–76.8)
NEUTROS ABS: 5 10*3/uL (ref 1.5–6.5)
Platelets: 277 10*3/uL (ref 145–400)
RBC: 4.15 10*6/uL (ref 3.70–5.45)
RDW: 13.4 % (ref 11.2–14.5)
RETIC %: 1.32 % (ref 0.70–2.10)
Retic Ct Abs: 54.78 10*3/uL (ref 33.70–90.70)
WBC: 7.4 10*3/uL (ref 3.9–10.3)
lymph#: 1.7 10*3/uL (ref 0.9–3.3)

## 2017-05-04 LAB — COMPREHENSIVE METABOLIC PANEL
ALBUMIN: 3.7 g/dL (ref 3.5–5.0)
ALK PHOS: 49 U/L (ref 40–150)
ALT: 14 U/L (ref 0–55)
ANION GAP: 7 meq/L (ref 3–11)
AST: 13 U/L (ref 5–34)
BUN: 8.2 mg/dL (ref 7.0–26.0)
CO2: 27 mEq/L (ref 22–29)
Calcium: 10 mg/dL (ref 8.4–10.4)
Chloride: 105 mEq/L (ref 98–109)
Creatinine: 0.9 mg/dL (ref 0.6–1.1)
GLUCOSE: 83 mg/dL (ref 70–140)
POTASSIUM: 3.8 meq/L (ref 3.5–5.1)
SODIUM: 139 meq/L (ref 136–145)
Total Bilirubin: 0.35 mg/dL (ref 0.20–1.20)
Total Protein: 6.7 g/dL (ref 6.4–8.3)

## 2017-05-04 LAB — FERRITIN: FERRITIN: 139 ng/mL (ref 9–269)

## 2017-05-04 LAB — LACTATE DEHYDROGENASE: LDH: 123 U/L — ABNORMAL LOW (ref 125–245)

## 2017-05-06 ENCOUNTER — Ambulatory Visit: Payer: 59 | Admitting: Hematology & Oncology

## 2017-05-06 ENCOUNTER — Other Ambulatory Visit: Payer: 59

## 2017-05-10 ENCOUNTER — Telehealth: Payer: Self-pay | Admitting: *Deleted

## 2017-05-10 NOTE — Progress Notes (Deleted)
Psychiatric Initial Adult Assessment   Patient Identification: Michelle Castillo MRN:  [id number] Date of Evaluation:  05/10/2017 Referral Source: *** Chief Complaint:   Visit Diagnosis: No diagnosis found.  History of Present Illness:   Michelle Castillo is a 51 year old female with depression, anxiety, ideopathic hemolytic anemia, type II diabetes, hypertension, hyperthyroidism, who is referred for depression,.  Per chart review, her depression persists after starting steroids.   Associated Signs/Symptoms: Depression Symptoms:  {DEPRESSION SYMPTOMS:20000} (Hypo) Manic Symptoms:  {BHH MANIC SYMPTOMS:22872} Anxiety Symptoms:  {BHH ANXIETY SYMPTOMS:22873} Psychotic Symptoms:  {BHH PSYCHOTIC SYMPTOMS:22874} PTSD Symptoms: {BHH PTSD SYMPTOMS:22875}  Past Psychiatric History:  I have reviewed the patient's psychiatry history in detail and updated the patient record.  Previous Psychotropic Medications: {YES/NO:21197}  Substance Abuse History in the last 12 months:  {yes no:314532}  Consequences of Substance Abuse: {BHH CONSEQUENCES OF SUBSTANCE ABUSE:22880}  Past Medical History:  Past Medical History:  Diagnosis Date  . ANEMIA, IRON DEFICIENCY   . DIABETES MELLITUS, TYPE II   . Headache(784.0)   . HYPERCHOLESTEROLEMIA   . HYPERTENSION   . Hyperthyroidism    no meds, slightly elevated, MD will recheck in 3 months  . Migraines   . URINARY INCONTINENCE     Past Surgical History:  Procedure Laterality Date  . ABDOMINAL HYSTERECTOMY    . APPENDECTOMY    . BARTHOLIN CYST MARSUPIALIZATION Right 02/22/2017   Procedure: BARTHOLIN CYST MARSUPIALIZATION;  Surgeon: Salvadore Dom, MD;  Location: [facility];  Service: Gynecology;  Laterality: Right;  . CESAREAN SECTION  1998  . CYSTOSCOPY N/A 02/22/2017   Procedure: CYSTOSCOPY;  Surgeon: Salvadore Dom, MD;  Location: [facility];  Service: Gynecology;  Laterality: N/A;  . LAPAROSCOPIC LYSIS OF ADHESIONS N/A 02/22/2017   Procedure:  EXTENSIVE LAPAROSCOPIC LYSIS OF ADHESIONS;  Surgeon: Salvadore Dom, MD;  Location: [facility];  Service: Gynecology;  Laterality: N/A;  Dr. Kieth Brightly   . OOPHORECTOMY Left 02/22/2017   Procedure: OOPHORECTOMY;  Surgeon: Salvadore Dom, MD;  Location: [facility];  Service: Gynecology;  Laterality: Left;  . TONSILLECTOMY AND ADENOIDECTOMY    . TOTAL LAPAROSCOPIC HYSTERECTOMY WITH SALPINGECTOMY Bilateral 02/22/2017   Procedure: HYSTERECTOMY TOTAL LAPAROSCOPIC WITH SALPINGECTOMY;  Surgeon: Salvadore Dom, MD;  Location: [facility];  Service: Gynecology;  Laterality: Bilateral;    Family Psychiatric History: ***  Family History:  Family History  Problem Relation Age of Onset  . Diabetes Father   . Colon cancer Father 53       2002  . Hypertension Father   . Stroke Father   . Heart disease Father        stints  . Diabetes Mother   . Thyroid disease Mother   . Hypertension Mother   . Breast cancer Maternal Aunt   . Breast cancer Maternal Aunt   . Breast cancer Maternal Aunt   . Breast cancer Maternal Aunt   . Breast cancer Maternal Aunt     Social History:   Social History   Social History  . Marital status: Married    Spouse name: N/A  . Number of children: 1  . Years of education: N/A   Occupational History  . Development worker, international aid    Social History Main Topics  . Smoking status: Never Smoker  . Smokeless tobacco: Never Used  . Alcohol use 0.0 oz/week     Comment: occasionally  . Drug use: No  . Sexual activity: Yes    Partners: Male  Birth control/ protection: Surgical   Other Topics Concern  . Not on file   Social History Narrative   Occupation: Works Orthoptist   Regular exercise-yes          Additional Social History: ***  Allergies:  No Known Allergies  Metabolic Disorder Labs: Lab Results  Component Value Date   HGBA1C 5.3 01/21/2017   MPG 105 01/21/2017   MPG 148 (H) 06/23/2012   No results found for: PROLACTIN Lab Results   Component Value Date   CHOL 206 (H) 06/28/2016   TRIG 121.0 06/28/2016   HDL 51.00 06/28/2016   CHOLHDL 4 06/28/2016   VLDL 24.2 06/28/2016   LDLCALC 131 (H) 06/28/2016   LDLCALC 126 (H) 03/11/2015     Current Medications: Current Outpatient Prescriptions  Medication Sig Dispense Refill  . citalopram (CELEXA) 20 MG tablet Take 1/2 a tablet a day for one week, if tolerating then increase to one tablet a day 30 tablet 1  . folic acid (FOLVITE) 1 MG tablet Take 1 tablet (1 mg total) by mouth daily. 30 tablet 0  . hydrochlorothiazide (HYDRODIURIL) 25 MG tablet Take 1 tablet (25 mg total) by mouth daily. for high blood pressure 30 tablet 11  . LORazepam (ATIVAN) 0.5 MG tablet Take 1 tablet (0.5 mg total) by mouth every 12 (twelve) hours as needed for anxiety. 20 tablet 0  . metFORMIN (GLUCOPHAGE-XR) 500 MG 24 hr tablet TAKE (2) TABLETS TWICE DAILY. (Patient taking differently: Take 1,000 mg by mouth 2 (two) times daily. ) 120 tablet 11  . pantoprazole (PROTONIX) 40 MG tablet Take 1 tablet (40 mg total) by mouth daily. 30 tablet 1  . potassium chloride SA (K-DUR,KLOR-CON) 20 MEQ tablet Take 1 tablet (20 mEq total) by mouth daily. 30 tablet 0  . SUMAtriptan (IMITREX) 100 MG tablet TAKE 1 TABLET AT ONSET-MAY REPEAT IN 2 HOURS AS NEEDED- NO MORE THAN 2 IN 24 HOURS. 30 tablet 2   No current facility-administered medications for this visit.     Neurologic: Headache: No Seizure: No Paresthesias:No  Musculoskeletal: Strength & Muscle Tone: within normal limits Gait & Station: normal Patient leans: N/A  Psychiatric Specialty Exam: ROS  Last menstrual period 12/29/2016.There is no height or weight on file to calculate BMI.  General Appearance: Fairly Groomed  Eye Contact:  Good  Speech:  Clear and Coherent  Volume:  Normal  Mood:  {BHH MOOD:22306}  Affect:  {Affect (PAA):22687}  Thought Process:  Coherent and Goal Directed  Orientation:  Full (Time, Place, and Person)  Thought  Content:  Logical  Suicidal Thoughts:  {ST/HT (PAA):22692}  Homicidal Thoughts:  {ST/HT (PAA):22692}  Memory:  Immediate;   Good Recent;   Good Remote;   Good  Judgement:  {Judgement (PAA):22694}  Insight:  {Insight (PAA):22695}  Psychomotor Activity:  Normal  Concentration:  Concentration: Good and Attention Span: Good  Recall:  Good  Fund of Knowledge:Good  Language: Good  Akathisia:  No  Handed:  Right  AIMS (if indicated):  N/A  Assets:  Communication Skills Desire for Improvement  ADL's:  Intact  Cognition: WNL  Sleep:  ***   Assessment  Plan  The patient demonstrates the following risk factors for suicide: Chronic risk factors for suicide include: {Chronic Risk Factors for OZHYQMV:78469629}. Acute risk factors for suicide include: {Acute Risk Factors for BMWUXLK:44010272}. Protective factors for this patient include: {Protective Factors for Suicide ZDGU:44034742}. Considering these factors, the overall suicide risk at this point appears to be {Desc; low/moderate/high:110033}.  Patient {ACTION; IS/IS JME:26834196} appropriate for outpatient follow up.   Treatment Plan Summary: Plan as above   Norman Clay, MD 9/11/201812:42 PM

## 2017-05-10 NOTE — Telephone Encounter (Signed)
Called pt and left message on voice mail re: Lab results good, no anemia or evidence of hemolysis as per Dr. Ernestina Penna instructions.

## 2017-05-10 NOTE — Telephone Encounter (Signed)
-----   Message from Truitt Merle, MD sent at 05/08/2017  7:53 PM EDT ----- Please let pt know her lab results, no anemia or evidence of hemolysis.  Thanks  Truitt Merle  05/08/2017

## 2017-05-12 ENCOUNTER — Ambulatory Visit (HOSPITAL_COMMUNITY): Payer: 59 | Admitting: Psychiatry

## 2017-06-01 ENCOUNTER — Other Ambulatory Visit: Payer: 59

## 2017-06-01 ENCOUNTER — Other Ambulatory Visit (HOSPITAL_BASED_OUTPATIENT_CLINIC_OR_DEPARTMENT_OTHER): Payer: 59

## 2017-06-01 DIAGNOSIS — D591 Autoimmune hemolytic anemia, unspecified: Secondary | ICD-10-CM

## 2017-06-01 DIAGNOSIS — N920 Excessive and frequent menstruation with regular cycle: Secondary | ICD-10-CM | POA: Diagnosis not present

## 2017-06-01 DIAGNOSIS — D500 Iron deficiency anemia secondary to blood loss (chronic): Secondary | ICD-10-CM | POA: Diagnosis not present

## 2017-06-01 LAB — COMPREHENSIVE METABOLIC PANEL
ALBUMIN: 3.8 g/dL (ref 3.5–5.0)
ALK PHOS: 46 U/L (ref 40–150)
ALT: 12 U/L (ref 0–55)
ANION GAP: 7 meq/L (ref 3–11)
AST: 12 U/L (ref 5–34)
BILIRUBIN TOTAL: 0.57 mg/dL (ref 0.20–1.20)
BUN: 7.8 mg/dL (ref 7.0–26.0)
CALCIUM: 9.7 mg/dL (ref 8.4–10.4)
CHLORIDE: 107 meq/L (ref 98–109)
CO2: 25 mEq/L (ref 22–29)
CREATININE: 0.8 mg/dL (ref 0.6–1.1)
EGFR: >90 mL/min/{1.73_m2} (ref >90)
Glucose: 162 mg/dl — ABNORMAL HIGH (ref 70–140)
Potassium: 4 mEq/L (ref 3.5–5.1)
Sodium: 139 mEq/L (ref 136–145)
TOTAL PROTEIN: 6.4 g/dL (ref 6.4–8.3)

## 2017-06-01 LAB — CBC & DIFF AND RETIC
BASO%: 1.2 % (ref 0.0–2.0)
Basophils Absolute: 0.1 10*3/uL (ref 0.0–0.1)
EOS%: 2.2 % (ref 0.0–7.0)
Eosinophils Absolute: 0.1 10*3/uL (ref 0.0–0.5)
HCT: 36.3 % (ref 34.8–46.6)
HGB: 12.2 g/dL (ref 11.6–15.9)
Immature Retic Fract: 1.6 % (ref 1.60–10.00)
LYMPH%: 28.6 % (ref 14.0–49.7)
MCH: 29.8 pg (ref 25.1–34.0)
MCHC: 33.6 g/dL (ref 31.5–36.0)
MCV: 88.5 fL (ref 79.5–101.0)
MONO#: 0.4 10*3/uL (ref 0.1–0.9)
MONO%: 8.2 % (ref 0.0–14.0)
NEUT#: 2.9 10*3/uL (ref 1.5–6.5)
NEUT%: 59.8 % (ref 38.4–76.8)
Platelets: 232 10*3/uL (ref 145–400)
RBC: 4.1 10*6/uL (ref 3.70–5.45)
RDW: 12.9 % (ref 11.2–14.5)
Retic %: 1.07 % (ref 0.70–2.10)
Retic Ct Abs: 43.87 10*3/uL (ref 33.70–90.70)
WBC: 4.9 10*3/uL (ref 3.9–10.3)
lymph#: 1.4 10*3/uL (ref 0.9–3.3)

## 2017-06-01 LAB — LACTATE DEHYDROGENASE: LDH: 114 U/L — ABNORMAL LOW (ref 125–245)

## 2017-06-07 ENCOUNTER — Telehealth: Payer: Self-pay | Admitting: *Deleted

## 2017-06-07 NOTE — Telephone Encounter (Signed)
Pt informed of good lab result. No anemia or evidence of hemolysis per message from Dr Burr Medico.  Pt stated that she did not see the psych dr as scheduled because she was doing better.  She did no take any anxiety meds, just woke up one morning and was feeling better.  She stated that someone else could use that appt instead of her since she was feeling so much better.

## 2017-06-23 ENCOUNTER — Ambulatory Visit (INDEPENDENT_AMBULATORY_CARE_PROVIDER_SITE_OTHER): Payer: 59 | Admitting: Endocrinology

## 2017-06-23 ENCOUNTER — Encounter: Payer: Self-pay | Admitting: Endocrinology

## 2017-06-23 VITALS — BP 138/80 | HR 84 | Wt 159.8 lb

## 2017-06-23 DIAGNOSIS — E119 Type 2 diabetes mellitus without complications: Secondary | ICD-10-CM | POA: Diagnosis not present

## 2017-06-23 LAB — POCT GLYCOSYLATED HEMOGLOBIN (HGB A1C): Hemoglobin A1C: 6

## 2017-06-23 NOTE — Progress Notes (Signed)
Subjective:    Patient ID: Michelle Castillo, female    DOB: 12/25/1965, 51 y.o.   MRN: [id number]  HPI  Pt returns for f/u of diabetes mellitus: DM type: 2 Dx'ed: 3536 Complications: none Therapy: metformin.  DKA: never Severe hypoglycemia: never Pancreatitis: never Pancreatic imaging: normal on 2018 CT Other: she has never been on insulin Interval history: she seldom needs imitrex for headache.  She has irritation of the eyes.  Past Medical History:  Diagnosis Date  . ANEMIA, IRON DEFICIENCY   . DIABETES MELLITUS, TYPE II   . Headache(784.0)   . HYPERCHOLESTEROLEMIA   . HYPERTENSION   . Hyperthyroidism    no meds, slightly elevated, MD will recheck in 3 months  . Migraines   . URINARY INCONTINENCE     Past Surgical History:  Procedure Laterality Date  . ABDOMINAL HYSTERECTOMY    . APPENDECTOMY    . BARTHOLIN CYST MARSUPIALIZATION Right 02/22/2017   Procedure: BARTHOLIN CYST MARSUPIALIZATION;  Surgeon: Salvadore Dom, MD;  Location: [facility];  Service: Gynecology;  Laterality: Right;  . CESAREAN SECTION  1998  . CYSTOSCOPY N/A 02/22/2017   Procedure: CYSTOSCOPY;  Surgeon: Salvadore Dom, MD;  Location: [facility];  Service: Gynecology;  Laterality: N/A;  . LAPAROSCOPIC LYSIS OF ADHESIONS N/A 02/22/2017   Procedure: EXTENSIVE LAPAROSCOPIC LYSIS OF ADHESIONS;  Surgeon: Salvadore Dom, MD;  Location: [facility];  Service: Gynecology;  Laterality: N/A;  Dr. Kieth Brightly   . OOPHORECTOMY Left 02/22/2017   Procedure: OOPHORECTOMY;  Surgeon: Salvadore Dom, MD;  Location: [facility];  Service: Gynecology;  Laterality: Left;  . TONSILLECTOMY AND ADENOIDECTOMY    . TOTAL LAPAROSCOPIC HYSTERECTOMY WITH SALPINGECTOMY Bilateral 02/22/2017   Procedure: HYSTERECTOMY TOTAL LAPAROSCOPIC WITH SALPINGECTOMY;  Surgeon: Salvadore Dom, MD;  Location: [facility];  Service: Gynecology;  Laterality: Bilateral;    Social History   Social History  . Marital status: Married    Spouse  name: N/A  . Number of children: 1  . Years of education: N/A   Occupational History  . Development worker, international aid    Social History Main Topics  . Smoking status: Never Smoker  . Smokeless tobacco: Never Used  . Alcohol use 0.0 oz/week     Comment: occasionally  . Drug use: No  . Sexual activity: Yes    Partners: Male    Birth control/ protection: Surgical   Other Topics Concern  . Not on file   Social History Narrative   Occupation: Works Orthoptist   Regular exercise-yes          Current Outpatient Prescriptions on File Prior to Visit  Medication Sig Dispense Refill  . hydrochlorothiazide (HYDRODIURIL) 25 MG tablet Take 1 tablet (25 mg total) by mouth daily. for high blood pressure 30 tablet 11  . metFORMIN (GLUCOPHAGE-XR) 500 MG 24 hr tablet TAKE (2) TABLETS TWICE DAILY. (Patient taking differently: Take 1,000 mg by mouth 2 (two) times daily. ) 120 tablet 11  . SUMAtriptan (IMITREX) 100 MG tablet TAKE 1 TABLET AT ONSET-MAY REPEAT IN 2 HOURS AS NEEDED- NO MORE THAN 2 IN 24 HOURS. 30 tablet 2   No current facility-administered medications on file prior to visit.     No Known Allergies  Family History  Problem Relation Age of Onset  . Diabetes Father   . Colon cancer Father 62       2002  . Hypertension Father   . Stroke Father   . Heart disease Father  stints  . Diabetes Mother   . Thyroid disease Mother   . Hypertension Mother   . Breast cancer Maternal Aunt   . Breast cancer Maternal Aunt   . Breast cancer Maternal Aunt   . Breast cancer Maternal Aunt   . Breast cancer Maternal Aunt     BP 138/80   Pulse 84   Wt 159 lb 12.8 oz (72.5 kg)   LMP 12/29/2016   SpO2 99%   BMI 25.03 kg/m   Review of Systems Denies tremor and palpitations    Objective:   Physical Exam VITAL SIGNS:  See vs page GENERAL: no distress NECK: thyroid is slightly enlarged, with irreg surface. Pulses: foot pulses are intact bilaterally.   MSK: no deformity of the feet  or ankles.  CV: no edema of the legs or ankles Skin:  no ulcer on the feet or ankles.  normal color and temp on the feet and ankles Neuro: sensation is intact to touch on the feet and ankles.    A1c=6.0%     Assessment & Plan:  Type 2 DM: well-controlled Headache: well-controlled HTN: well-controlled Please continue the same medications

## 2017-06-23 NOTE — Patient Instructions (Addendum)
Please continue the same metformin and as-needed sumatriptin. Please see an eye specialist.  you will receive a phone call, about a day and time for an appointment Please come back soon for a regular physical.

## 2017-06-24 ENCOUNTER — Other Ambulatory Visit: Payer: Self-pay | Admitting: Certified Nurse Midwife

## 2017-06-24 ENCOUNTER — Telehealth: Payer: Self-pay | Admitting: Endocrinology

## 2017-06-24 DIAGNOSIS — Z1231 Encounter for screening mammogram for malignant neoplasm of breast: Secondary | ICD-10-CM

## 2017-06-24 NOTE — Telephone Encounter (Signed)
Pt is asking about the diabetic eye exam the place we referred her to is two months out is there any place that could get her in sooner

## 2017-06-28 ENCOUNTER — Ambulatory Visit: Payer: 59 | Admitting: Endocrinology

## 2017-06-28 ENCOUNTER — Encounter: Payer: Self-pay | Admitting: Obstetrics and Gynecology

## 2017-06-28 ENCOUNTER — Telehealth: Payer: Self-pay | Admitting: Endocrinology

## 2017-06-28 NOTE — Telephone Encounter (Signed)
Patient stated she was referred to this office [facility] for a referral, she stated that they were to far out, and can she be referred somewhere else. Please advise

## 2017-06-28 NOTE — Telephone Encounter (Signed)
Please refer message to [facility]

## 2017-06-29 ENCOUNTER — Other Ambulatory Visit (HOSPITAL_BASED_OUTPATIENT_CLINIC_OR_DEPARTMENT_OTHER): Payer: 59

## 2017-06-29 ENCOUNTER — Encounter: Payer: Self-pay | Admitting: Nurse Practitioner

## 2017-06-29 ENCOUNTER — Inpatient Hospital Stay: Payer: 59 | Attending: Hematology | Admitting: Nurse Practitioner

## 2017-06-29 ENCOUNTER — Telehealth: Payer: Self-pay | Admitting: Hematology

## 2017-06-29 VITALS — BP 140/82 | HR 78 | Temp 98.2°F | Resp 18 | Ht 67.0 in | Wt 157.9 lb

## 2017-06-29 DIAGNOSIS — N920 Excessive and frequent menstruation with regular cycle: Secondary | ICD-10-CM

## 2017-06-29 DIAGNOSIS — D591 Autoimmune hemolytic anemia, unspecified: Secondary | ICD-10-CM

## 2017-06-29 DIAGNOSIS — D72829 Elevated white blood cell count, unspecified: Secondary | ICD-10-CM | POA: Diagnosis not present

## 2017-06-29 DIAGNOSIS — D500 Iron deficiency anemia secondary to blood loss (chronic): Secondary | ICD-10-CM | POA: Diagnosis not present

## 2017-06-29 LAB — CBC & DIFF AND RETIC
BASO%: 1.8 % (ref 0.0–2.0)
Basophils Absolute: 0.1 10*3/uL (ref 0.0–0.1)
EOS%: 2.3 % (ref 0.0–7.0)
Eosinophils Absolute: 0.1 10*3/uL (ref 0.0–0.5)
HCT: 40.9 % (ref 34.8–46.6)
HGB: 13.5 g/dL (ref 11.6–15.9)
IMMATURE RETIC FRACT: 2.5 % (ref 1.60–10.00)
LYMPH%: 31.5 % (ref 14.0–49.7)
MCH: 28.9 pg (ref 25.1–34.0)
MCHC: 33 g/dL (ref 31.5–36.0)
MCV: 87.6 fL (ref 79.5–101.0)
MONO#: 0.4 10*3/uL (ref 0.1–0.9)
MONO%: 9.1 % (ref 0.0–14.0)
NEUT%: 55.3 % (ref 38.4–76.8)
NEUTROS ABS: 2.4 10*3/uL (ref 1.5–6.5)
Platelets: 291 10*3/uL (ref 145–400)
RBC: 4.67 10*6/uL (ref 3.70–5.45)
RDW: 12.8 % (ref 11.2–14.5)
Retic %: 0.89 % (ref 0.70–2.10)
Retic Ct Abs: 41.56 10*3/uL (ref 33.70–90.70)
WBC: 4.4 10*3/uL (ref 3.9–10.3)
lymph#: 1.4 10*3/uL (ref 0.9–3.3)

## 2017-06-29 LAB — LACTATE DEHYDROGENASE: LDH: 104 U/L — AB (ref 125–245)

## 2017-06-29 NOTE — Telephone Encounter (Signed)
Gave avs and calendar for December - June 2019 ° °

## 2017-06-29 NOTE — Progress Notes (Signed)
[place]  [phone] [phone]  Clinic Follow up Note   Patient Care Team: Renato Shin, MD as PCP - General Regina Eck, CNM as Referring Physician (Certified Nurse Midwife) 06/29/2017  CC: f/u autoimmune hemolytic anemia  HISTORY OF PRESENTING ILLNESS:  Michelle Castillo 51 y.o. female is here because of hemolytic anemia.  The patient was recently hospitalized from 03/06/17 - 03/10/17 for autoimmune hemolytic anemia. She underwent hysterectomy on 02/22/17 because of symptomatic fibroid uterus with menorrhagia leading to anemia, and was feeling well until 7/8 when she experienced extreme fatigue. She was originally admitted to [facility], but was transferred to [facility] on 03/08/17. I initially consulted with her as an inpatient on 03/09/17. She was diagnosed with autoimmune hemolytic anemia, and I started her on steroids in the hospital. She responded well, was discharged home on 03/10/17 with follow up appointment in this clinic.  The patient followed up with Dr. Talbert Nan on 03/14/17. The patient reports she has lost her appetite, and has lost 5 lbs in the last week. The patient believes this is caused by Prednisone. She recently tapered this to 60 mg daily. She also reports diarrhea, which may be contributing to her weight loss. She reports she is sleeping well at night. She denies pain. She has almost completed her prescribed course of antibiotics.    CURRENT THERAPY: observation   INTERVAL HISTORY: Michelle Castillo returns for follow-up of her anemia as scheduled. She was last seen on 04/06/2017. She feels well, has gained some weight back since previous visit. Her anxiety has resolved, she is off medication. She denies fever, chills, or recent illness. BMs are regular. She denies bleeding. She has had red and watering eyes upon wakening, denies pain, itching, vision loss or blurred vision.  REVIEW OF SYSTEMS:   Constitutional: Denies  fatigue, fevers, chills or abnormal weight loss Eyes: Denies blurriness of vision, vision loss, eye pain, itching, or drainage. (+) redness (+) watering Ears, nose, mouth, throat, and face: Denies mucositis or sore throat Respiratory: Denies cough, dyspnea or wheezes Cardiovascular: Denies palpitation, chest discomfort or lower extremity swelling Gastrointestinal:  Denies nausea, vomiting, constipation, diarrhea, heartburn or change in bowel habits. No blood in stool Skin: Denies abnormal skin rashes Lymphatics: Denies new lymphadenopathy or easy bruising Neurological:Denies numbness, tingling or new weaknesses Behavioral/Psych: Mood is stable, no new changes  All other systems were reviewed with the patient and are negative.  MEDICAL HISTORY:  Past Medical History:  Diagnosis Date  . ANEMIA, IRON DEFICIENCY   . DIABETES MELLITUS, TYPE II   . Headache(784.0)   . HYPERCHOLESTEROLEMIA   . HYPERTENSION   . Hyperthyroidism    no meds, slightly elevated, MD will recheck in 3 months  . Migraines   . URINARY INCONTINENCE     SURGICAL HISTORY: Past Surgical History:  Procedure Laterality Date  . ABDOMINAL HYSTERECTOMY    . APPENDECTOMY    . BARTHOLIN CYST MARSUPIALIZATION Right 02/22/2017   Procedure: BARTHOLIN CYST MARSUPIALIZATION;  Surgeon: Salvadore Dom, MD;  Location: [facility];  Service: Gynecology;  Laterality: Right;  . CESAREAN SECTION  1998  . CYSTOSCOPY N/A 02/22/2017   Procedure: CYSTOSCOPY;  Surgeon: Salvadore Dom, MD;  Location: [facility];  Service: Gynecology;  Laterality: N/A;  . LAPAROSCOPIC LYSIS OF ADHESIONS N/A 02/22/2017   Procedure: EXTENSIVE LAPAROSCOPIC LYSIS OF ADHESIONS;  Surgeon: Salvadore Dom, MD;  Location: [facility];  Service: Gynecology;  Laterality: N/A;  Dr. Kieth Brightly   .  OOPHORECTOMY Left 02/22/2017   Procedure: OOPHORECTOMY;  Surgeon: Salvadore Dom, MD;  Location: [facility];  Service: Gynecology;  Laterality: Left;  . TONSILLECTOMY AND  ADENOIDECTOMY    . TOTAL LAPAROSCOPIC HYSTERECTOMY WITH SALPINGECTOMY Bilateral 02/22/2017   Procedure: HYSTERECTOMY TOTAL LAPAROSCOPIC WITH SALPINGECTOMY;  Surgeon: Salvadore Dom, MD;  Location: [facility];  Service: Gynecology;  Laterality: Bilateral;    I have reviewed the social history and family history with the patient and they are unchanged from previous note.  ALLERGIES:  has No Known Allergies.  MEDICATIONS:  Current Outpatient Prescriptions  Medication Sig Dispense Refill  . hydrochlorothiazide (HYDRODIURIL) 25 MG tablet Take 1 tablet (25 mg total) by mouth daily. for high blood pressure 30 tablet 11  . metFORMIN (GLUCOPHAGE-XR) 500 MG 24 hr tablet TAKE (2) TABLETS TWICE DAILY. (Patient taking differently: Take 1,000 mg by mouth 2 (two) times daily. ) 120 tablet 11  . SUMAtriptan (IMITREX) 100 MG tablet TAKE 1 TABLET AT ONSET-MAY REPEAT IN 2 HOURS AS NEEDED- NO MORE THAN 2 IN 24 HOURS. (Patient not taking: Reported on 06/29/2017) 30 tablet 2   No current facility-administered medications for this visit.     PHYSICAL EXAMINATION: ECOG PERFORMANCE STATUS: 0 - Asymptomatic Vitals:   06/29/17 1132  BP: 140/82  Pulse: 78  Resp: 18  Temp: 98.2 F (36.8 C)  SpO2: 100%   Filed Weights   06/29/17 1132  Weight: 157 lb 14.4 oz (71.6 kg)   GENERAL:alert, no distress and comfortable SKIN: skin color, texture, turgor are normal, no rashes or significant lesions EYES: normal, Conjunctiva are pink and non-injected, mild scleral erythema OROPHARYNX:no exudate, no erythema and lips, buccal mucosa, and tongue normal  NECK: supple, thyroid normal size, non-tender, without nodularity LYMPH:  no palpable cervical or supraclavicular lymphadenopathy  LUNGS: clear to auscultation bilaterally with normal breathing effort HEART: regular rate & rhythm and no murmurs and no lower extremity edema ABDOMEN:abdomen soft, non-tender and normal bowel sounds. No palpable hepatosplenomegaly or  masses Musculoskeletal:no cyanosis of digits and no clubbing  NEURO: alert & oriented x 3 with fluent speech, no focal motor/sensory deficits  LABORATORY DATA:  I have reviewed the data as listed CBC Latest Ref Rng & Units 06/29/2017 06/01/2017 05/04/2017  WBC 3.9 - 10.3 10e3/uL 4.4 4.9 7.4  Hemoglobin 11.6 - 15.9 g/dL 13.5 12.2 12.6  Hematocrit 34.8 - 46.6 % 40.9 36.3 37.5  Platelets 145 - 400 10e3/uL 291 232 277     CMP Latest Ref Rng & Units 06/01/2017 05/04/2017 04/06/2017  Glucose 70 - 140 mg/dl 162(H) 83 97  BUN 7.0 - 26.0 mg/dL 7.8 8.2 8.6  Creatinine 0.6 - 1.1 mg/dL 0.8 0.9 0.9  Sodium 136 - 145 mEq/L 139 139 141  Potassium 3.5 - 5.1 mEq/L 4.0 3.8 3.6  Chloride 101 - 111 mmol/L - - -  CO2 22 - 29 mEq/L 25 27 27   Calcium 8.4 - 10.4 mg/dL 9.7 10.0 10.3  Total Protein 6.4 - 8.3 g/dL 6.4 6.7 6.9  Total Bilirubin 0.20 - 1.20 mg/dL 0.57 0.35 0.51  Alkaline Phos 40 - 150 U/L 46 49 44  AST 5 - 34 U/L 12 13 11   ALT 0 - 55 U/L 12 14 16     RADIOGRAPHIC STUDIES: I have personally reviewed the radiological images as listed and agreed with the findings in the report. No results found.   ASSESSMENT & PLAN: 51 y.o. woman with history of iron deficiency due to menorrhagia, recent hysterectomy on 02/22/2017, and  was found to have severe anemia and required hospitalization and multiple blood transfusion on 03/06/2017.  1. Autoimmune hemolytic anemia  2. Depression, anxiety, weight loss 3. History of iron deficiency secondary to menorrhagia 4. History of pelvic fluid collection 5. Leukocytosis  Ms. Weich appears stable today. Her CBC has been normal x3 months, Hgb 13.5 today anemia has resolved. LDH not elevated. She had hysterectomy in 01/2017, denies bleeding; Iron studies in 04/2017 indicate she is not iron deficient. Physical exam is normal. She is doing well overall, anxiety has resolved. We discussed her autoimmune hemolytic anemia has resolved at this time but may recur with stress,  infection, or inflammation in the future. We will continue to monitor her labs in 2-3 months x3, f/u with Dr. Burr Medico in 8-9 months. She continues regular health maintenance such as mammogram due early 2019, and colonoscopy due 04/2018. She sees her PCP for annual wellness visits.   PLAN: Labs in 2-3 months x3 F/u with Dr. Burr Medico in 8-9 months Continue health maintenance and annual f/u with PCP  All questions were answered. The patient knows to call the clinic with any problems, questions or concerns. No barriers to learning was detected.     Alla Feeling, NP 06/29/17

## 2017-07-04 ENCOUNTER — Ambulatory Visit (INDEPENDENT_AMBULATORY_CARE_PROVIDER_SITE_OTHER): Payer: 59 | Admitting: Obstetrics and Gynecology

## 2017-07-04 ENCOUNTER — Telehealth: Payer: Self-pay | Admitting: Obstetrics and Gynecology

## 2017-07-04 ENCOUNTER — Encounter: Payer: Self-pay | Admitting: Obstetrics and Gynecology

## 2017-07-04 VITALS — BP 168/90 | HR 92 | Resp 16 | Wt 154.0 lb

## 2017-07-04 DIAGNOSIS — N762 Acute vulvitis: Secondary | ICD-10-CM

## 2017-07-04 DIAGNOSIS — N898 Other specified noninflammatory disorders of vagina: Secondary | ICD-10-CM | POA: Diagnosis not present

## 2017-07-04 DIAGNOSIS — N3090 Cystitis, unspecified without hematuria: Secondary | ICD-10-CM

## 2017-07-04 DIAGNOSIS — Z8679 Personal history of other diseases of the circulatory system: Secondary | ICD-10-CM

## 2017-07-04 DIAGNOSIS — R300 Dysuria: Secondary | ICD-10-CM | POA: Diagnosis not present

## 2017-07-04 LAB — POCT URINALYSIS DIPSTICK
BILIRUBIN UA: NEGATIVE
GLUCOSE UA: NEGATIVE
Ketones, UA: NEGATIVE
Nitrite, UA: NEGATIVE
Protein, UA: NEGATIVE
Urobilinogen, UA: NEGATIVE E.U./dL — AB
pH, UA: 6.5 (ref 5.0–8.0)

## 2017-07-04 MED ORDER — PHENAZOPYRIDINE HCL 200 MG PO TABS: 200.0000 mg | ORAL_TABLET | Freq: Three times a day (TID) | ORAL | 0 refills | Status: DC | PRN

## 2017-07-04 MED ORDER — SULFAMETHOXAZOLE-TRIMETHOPRIM 800-160 MG PO TABS: 1.0000 | ORAL_TABLET | Freq: Two times a day (BID) | ORAL | 0 refills | Status: DC

## 2017-07-04 NOTE — Progress Notes (Signed)
GYNECOLOGY  VISIT   HPI: 51 y.o.   Married  [place] American  female   [phone] with Patient's last menstrual period was 12/29/2016.   here c/o dysuira and blood when wiping after voiding  X 3 days. She denies frequency and urgency. She c/o mild pain in her lower abdomen. She see's blood when she wipes. Not on her underwear.  No fevers or flank pain.  2 weeks ago she felt irritated on the vulva. No itching. Slight vaginal discharge, white.  Sexually active, no pain.    GYNECOLOGIC HISTORY: Patient's last menstrual period was 12/29/2016. Contraception:hysterectomy  Menopausal hormone therapy: none         OB History    Gravida Para Term Preterm AB Living   2 1     1 1    SAB TAB Ectopic Multiple Live Births     1     1         Patient Active Problem List   Diagnosis Date Noted  . Anxiety and depression 04/06/2017  . Symptomatic anemia 03/09/2017  . Autoimmune hemolytic anemia ([place]) 03/07/2017  . Leukemoid reaction   . Anemia 03/06/2017  . Status post laparoscopic hysterectomy 02/22/2017  . Hypokalemia 02/14/2017  . Pain in joint, lower leg 03/15/2013  . Encounter for long-term (current) use of other medications 06/23/2012  . Routine general medical examination at a health care facility 06/23/2012  . Screening examination for infectious disease 06/23/2012  . HYPERCHOLESTEROLEMIA 07/30/2010  . Iron deficiency anemia 07/30/2010  . HEADACHE 03/19/2008  . Hyperthyroidism 07/10/2007  . MYALGIA 07/10/2007  . Diabetes ([place]) 04/06/2007  . Essential hypertension 04/06/2007  . URINARY INCONTINENCE 04/06/2007    Past Medical History:  Diagnosis Date  . ANEMIA, IRON DEFICIENCY   . DIABETES MELLITUS, TYPE II   . Headache(784.0)   . HYPERCHOLESTEROLEMIA   . HYPERTENSION   . Hyperthyroidism    no meds, slightly elevated, MD will recheck in 3 months  . Migraines   . URINARY INCONTINENCE     Past Surgical History:  Procedure Laterality Date  . ABDOMINAL HYSTERECTOMY    .  APPENDECTOMY    . CESAREAN SECTION  1998  . TONSILLECTOMY AND ADENOIDECTOMY      Current Outpatient Medications  Medication Sig Dispense Refill  . hydrochlorothiazide (HYDRODIURIL) 25 MG tablet Take 1 tablet (25 mg total) by mouth daily. for high blood pressure 30 tablet 11  . metFORMIN (GLUCOPHAGE-XR) 500 MG 24 hr tablet TAKE (2) TABLETS TWICE DAILY. (Patient taking differently: Take 1,000 mg by mouth 2 (two) times daily. ) 120 tablet 11  . SUMAtriptan (IMITREX) 100 MG tablet TAKE 1 TABLET AT ONSET-MAY REPEAT IN 2 HOURS AS NEEDED- NO MORE THAN 2 IN 24 HOURS. 30 tablet 2   No current facility-administered medications for this visit.      ALLERGIES: Patient has no known allergies.  Family History  Problem Relation Age of Onset  . Diabetes Father   . Colon cancer Father 50       2002  . Hypertension Father   . Stroke Father   . Heart disease Father        stints  . Diabetes Mother   . Thyroid disease Mother   . Hypertension Mother   . Breast cancer Maternal Aunt   . Breast cancer Maternal Aunt   . Breast cancer Maternal Aunt   . Breast cancer Maternal Aunt   . Breast cancer Maternal Aunt     Social History  Socioeconomic History  . Marital status: Married    Spouse name: Not on file  . Number of children: 1  . Years of education: Not on file  . Highest education level: Not on file  Social Needs  . Financial resource strain: Not on file  . Food insecurity - worry: Not on file  . Food insecurity - inability: Not on file  . Transportation needs - medical: Not on file  . Transportation needs - non-medical: Not on file  Occupational History  . Occupation: Development worker, international aid  Tobacco Use  . Smoking status: Never Smoker  . Smokeless tobacco: Never Used  Substance and Sexual Activity  . Alcohol use: Yes    Alcohol/week: 0.0 oz    Comment: occasionally  . Drug use: No  . Sexual activity: Yes    Partners: Male    Birth control/protection: Surgical  Other Topics  Concern  . Not on file  Social History Narrative   Occupation: Works Orthoptist   Regular exercise-yes          Review of Systems  Constitutional: Negative.   HENT: Negative.   Eyes: Negative.   Respiratory: Negative.   Cardiovascular: Negative.   Gastrointestinal: Negative.   Genitourinary: Positive for dysuria and hematuria.       Vaginal irritation  Musculoskeletal: Negative.   Skin: Negative.   Neurological: Negative.   Endo/Heme/Allergies: Negative.   Psychiatric/Behavioral: Negative.     PHYSICAL EXAMINATION:    BP (!) 170/90 (BP Location: Right Arm, Patient Position: Sitting, Cuff Size: Normal)   Pulse 92   Resp 16   Wt 154 lb (69.9 kg)   LMP 12/29/2016   BMI 24.12 kg/m     General appearance: alert, cooperative and appears stated age Abdomen: soft, mildly tender in the suprapubic region; non distended, no masses,  no organomegaly  Pelvic: External genitalia:  no lesions              Urethra:  normal appearing urethra with no masses, tenderness or lesions              Bartholins and Skenes: normal                 Vagina: normal appearing vagina with normal vaginal cuff. Increase in thick, clumpy, white vaginal discharge.              Cervix: absent              Bimanual Exam:  Uterus:  uterus absent              Adnexa: no mass, fullness, tenderness              Bladder: mildly tender to palpation  Chaperone was present for exam.  Urine dip: ++ blood, 3+ leukocytes  Wet prep: ? clue, no trich, rare wbc KOH: no yeast PH: 4.5   ASSESSMENT UTI Vaginal discharge with mild vulvitis, slides without yeast, suspicious for BV H/O HTN, elevated BP today, will recheck if still elevated will f/u with her primary    PLAN Urine for ua, c&s Affirm sent Treat with bactrim ds and pyridium   An After Visit Summary was printed and given to the patient.

## 2017-07-04 NOTE — Telephone Encounter (Signed)
Patient says she is seeing a little blood when she wipe.

## 2017-07-04 NOTE — Telephone Encounter (Signed)
Spoke with patient. Patient states that on Saturday she began to have pain with urination. Has noticed blood with wiping. Denies any urinary frequency, urgency, lower back pain, fever, or chills. Advised will need to be seen for further evaluation. Patient is agreeable. Appointment scheduled for 07/04/2017 at 12:45 pm with Dr.Jertson. Patient is agreeable to date and time.  Routing to provider for final review. Patient agreeable to disposition. Will close encounter.

## 2017-07-04 NOTE — Patient Instructions (Signed)

## 2017-07-05 LAB — VAGINITIS/VAGINOSIS, DNA PROBE
Candida Species: NEGATIVE
Gardnerella vaginalis: NEGATIVE
TRICHOMONAS VAG: NEGATIVE

## 2017-07-05 LAB — URINALYSIS, MICROSCOPIC ONLY: CASTS: NONE SEEN /LPF

## 2017-07-05 LAB — URINE CULTURE

## 2017-07-08 ENCOUNTER — Telehealth: Payer: Self-pay | Admitting: Obstetrics and Gynecology

## 2017-07-08 NOTE — Telephone Encounter (Signed)
Spoke with patient. Patient states she was seen on 11/5, Urine culture and vaginitis swab negative. Patient states she still feels vaginal irritation, can not describe what, just knows something is not back to normal.  Has increased fluids, was advised to schedule OV if symptoms continue.   OV scheduled for 11/12 at 3pm with Dr. Talbert Nan. Patient is agreeable to date and time.   Advised patient Dr. Talbert Nan is out of the office, covering provider will review, will return call with any additional recommendations.  Routing to provider for final review. Patient is agreeable to disposition. Will close encounter.   Cc: Dr. Talbert Nan

## 2017-07-08 NOTE — Telephone Encounter (Signed)
Patient is still having some symptoms she would like to discuss with the nurse.

## 2017-07-11 ENCOUNTER — Ambulatory Visit: Payer: Self-pay | Admitting: Obstetrics and Gynecology

## 2017-07-11 ENCOUNTER — Telehealth: Payer: Self-pay | Admitting: Obstetrics and Gynecology

## 2017-07-11 NOTE — Telephone Encounter (Signed)
Patient cancelled appointment this afternoon because she is feeling better. When appointment was made she was told to call and cancel if she didn't need it.

## 2017-07-18 ENCOUNTER — Encounter: Payer: Self-pay | Admitting: Endocrinology

## 2017-07-18 ENCOUNTER — Ambulatory Visit (INDEPENDENT_AMBULATORY_CARE_PROVIDER_SITE_OTHER): Payer: 59 | Admitting: Endocrinology

## 2017-07-18 VITALS — BP 128/84 | HR 90 | Wt 154.4 lb

## 2017-07-18 DIAGNOSIS — Z23 Encounter for immunization: Secondary | ICD-10-CM

## 2017-07-18 DIAGNOSIS — E0590 Thyrotoxicosis, unspecified without thyrotoxic crisis or storm: Secondary | ICD-10-CM | POA: Diagnosis not present

## 2017-07-18 DIAGNOSIS — Z0000 Encounter for general adult medical examination without abnormal findings: Secondary | ICD-10-CM

## 2017-07-18 LAB — LIPID PANEL
CHOL/HDL RATIO: 4
Cholesterol: 211 mg/dL — ABNORMAL HIGH (ref 0–200)
HDL: 52 mg/dL (ref >39.00)
LDL Cholesterol: 140 mg/dL — ABNORMAL HIGH (ref 0–99)
NONHDL: 158.52
Triglycerides: 94 mg/dL (ref 0.0–149.0)
VLDL: 18.8 mg/dL (ref 0.0–40.0)

## 2017-07-18 LAB — T4, FREE: FREE T4: 0.95 ng/dL (ref 0.60–1.60)

## 2017-07-18 LAB — TSH: TSH: 0.34 u[IU]/mL — ABNORMAL LOW (ref 0.35–4.50)

## 2017-07-18 MED ORDER — HYDROCHLOROTHIAZIDE 25 MG PO TABS: 25.0000 mg | ORAL_TABLET | Freq: Every day | ORAL | 3 refills | Status: DC

## 2017-07-18 MED ORDER — METFORMIN HCL ER 500 MG PO TB24: ORAL_TABLET | ORAL | 3 refills | Status: DC

## 2017-07-18 MED ORDER — SUMATRIPTAN SUCCINATE 100 MG PO TABS: ORAL_TABLET | ORAL | 2 refills | Status: DC

## 2017-07-18 NOTE — Patient Instructions (Addendum)
Please consider these measures for your health:  minimize alcohol.  Do not use tobacco products.  Have a colonoscopy at least every 10 years from age 49.  Women should have an annual mammogram from age 60.  Keep firearms safely stored.  Always use seat belts.  have working smoke alarms in your home.  See an eye doctor and dentist regularly.  Never drive under the influence of alcohol or drugs (including prescription drugs).    blood tests are requested for you today.  We'll let you know about the results.  Please call if you want to see a sleep specialist.   Please come back for a follow-up appointment in 1 year.

## 2017-07-18 NOTE — Progress Notes (Signed)
Subjective:    Patient ID: Michelle Castillo, female    DOB: December 02, 1965, 51 y.o.   MRN: [id number]  HPI Pt is here for regular wellness examination, and is feeling pretty well in general, and says chronic med probs are stable, except as noted below Past Medical History:  Diagnosis Date  . ANEMIA, IRON DEFICIENCY   . DIABETES MELLITUS, TYPE II   . Headache(784.0)   . HYPERCHOLESTEROLEMIA   . HYPERTENSION   . Hyperthyroidism    no meds, slightly elevated, MD will recheck in 3 months  . Migraines   . URINARY INCONTINENCE     Past Surgical History:  Procedure Laterality Date  . ABDOMINAL HYSTERECTOMY    . APPENDECTOMY    . BARTHOLIN CYST MARSUPIALIZATION Right 02/22/2017   Procedure: BARTHOLIN CYST MARSUPIALIZATION;  Surgeon: Salvadore Dom, MD;  Location: [facility];  Service: Gynecology;  Laterality: Right;  . CESAREAN SECTION  1998  . CYSTOSCOPY N/A 02/22/2017   Procedure: CYSTOSCOPY;  Surgeon: Salvadore Dom, MD;  Location: [facility];  Service: Gynecology;  Laterality: N/A;  . LAPAROSCOPIC LYSIS OF ADHESIONS N/A 02/22/2017   Procedure: EXTENSIVE LAPAROSCOPIC LYSIS OF ADHESIONS;  Surgeon: Salvadore Dom, MD;  Location: [facility];  Service: Gynecology;  Laterality: N/A;  Dr. Kieth Brightly   . OOPHORECTOMY Left 02/22/2017   Procedure: OOPHORECTOMY;  Surgeon: Salvadore Dom, MD;  Location: [facility];  Service: Gynecology;  Laterality: Left;  . TONSILLECTOMY AND ADENOIDECTOMY    . TOTAL LAPAROSCOPIC HYSTERECTOMY WITH SALPINGECTOMY Bilateral 02/22/2017   Procedure: HYSTERECTOMY TOTAL LAPAROSCOPIC WITH SALPINGECTOMY;  Surgeon: Salvadore Dom, MD;  Location: [facility];  Service: Gynecology;  Laterality: Bilateral;    Social History   Socioeconomic History  . Marital status: Married    Spouse name: Not on file  . Number of children: 1  . Years of education: Not on file  . Highest education level: Not on file  Social Needs  . Financial resource strain: Not on file  . Food  insecurity - worry: Not on file  . Food insecurity - inability: Not on file  . Transportation needs - medical: Not on file  . Transportation needs - non-medical: Not on file  Occupational History  . Occupation: Development worker, international aid  Tobacco Use  . Smoking status: Never Smoker  . Smokeless tobacco: Never Used  Substance and Sexual Activity  . Alcohol use: Yes    Alcohol/week: 0.0 oz    Comment: occasionally  . Drug use: No  . Sexual activity: Yes    Partners: Male    Birth control/protection: Surgical  Other Topics Concern  . Not on file  Social History Narrative   Occupation: Works Orthoptist   Regular exercise-yes          No current outpatient medications on file prior to visit.   No current facility-administered medications on file prior to visit.     No Known Allergies  Family History  Problem Relation Age of Onset  . Diabetes Father   . Colon cancer Father 26       2002  . Hypertension Father   . Stroke Father   . Heart disease Father        stints  . Diabetes Mother   . Thyroid disease Mother   . Hypertension Mother   . Breast cancer Maternal Aunt   . Breast cancer Maternal Aunt   . Breast cancer Maternal Aunt   . Breast cancer Maternal Aunt   . Breast  cancer Maternal Aunt     BP 128/84 (BP Location: Right Leg, Patient Position: Sitting, Cuff Size: Normal)   Pulse 90   Wt 154 lb 6.4 oz (70 kg)   LMP 12/29/2016   SpO2 95%   BMI 24.18 kg/m    Review of Systems Denies fever, fatigue, diplopia, hearing loss, chest pain, sob, back pain, depression, BRBPR, hematuria, syncope, numbness, allergy sxs, easy bruising, and rash.  She has cold intolerance and snoring.     Objective:   Physical Exam VS: see vs page GEN: no distress HEAD: head: no deformity eyes: no periorbital swelling, no proptosis.  external nose and ears are normal mouth: no lesion seen NECK: supple, thyroid is not enlarged CHEST WALL: no deformity.  LUNGS:  Clear to  auscultation.  BREASTS: sees gyn.   CV: reg rate and rhythm, no murmur.  ABD: abdomen is soft, nontender.  no hepatosplenomegaly.  not distended.  no hernia.  GENITALIA/RECTAL: sees gyn MUSCULOSKELETAL: muscle bulk and strength are grossly normal.  no obvious joint swelling.  gait is normal and steady EXTEMITIES: no deformity.  no ulcer on the feet.  feet are of normal color and temp.  no edema PULSES: dorsalis pedis intact bilat.  no carotid bruit NEURO:  cn 2-12 grossly intact.   readily moves all 4's.  sensation is intact to touch on the feet.  SKIN:  Normal texture and temperature.  No rash or suspicious lesion is visible.   NODES:  None palpable at the neck.  PSYCH: alert, well-oriented.  Does not appear anxious nor depressed.       Assessment & Plan:  Wellness visit today, with problems stable, except as noted.   Patient Instructions  Please consider these measures for your health:  minimize alcohol.  Do not use tobacco products.  Have a colonoscopy at least every 10 years from age 50.  Women should have an annual mammogram from age 92.  Keep firearms safely stored.  Always use seat belts.  have working smoke alarms in your home.  See an eye doctor and dentist regularly.  Never drive under the influence of alcohol or drugs (including prescription drugs).    blood tests are requested for you today.  We'll let you know about the results.  Please call if you want to see a sleep specialist.   Please come back for a follow-up appointment in 1 year.

## 2017-07-27 DIAGNOSIS — H40033 Anatomical narrow angle, bilateral: Secondary | ICD-10-CM | POA: Diagnosis not present

## 2017-07-27 DIAGNOSIS — H04123 Dry eye syndrome of bilateral lacrimal glands: Secondary | ICD-10-CM | POA: Diagnosis not present

## 2017-08-12 DIAGNOSIS — H20012 Primary iridocyclitis, left eye: Secondary | ICD-10-CM | POA: Diagnosis not present

## 2017-08-19 DIAGNOSIS — H20012 Primary iridocyclitis, left eye: Secondary | ICD-10-CM | POA: Diagnosis not present

## 2017-08-24 ENCOUNTER — Encounter: Payer: Self-pay | Admitting: Obstetrics and Gynecology

## 2017-08-24 ENCOUNTER — Ambulatory Visit (INDEPENDENT_AMBULATORY_CARE_PROVIDER_SITE_OTHER): Payer: 59 | Admitting: Obstetrics and Gynecology

## 2017-08-24 ENCOUNTER — Other Ambulatory Visit: Payer: Self-pay

## 2017-08-24 VITALS — BP 162/90 | HR 84 | Resp 16 | Ht 66.0 in | Wt 158.0 lb

## 2017-08-24 DIAGNOSIS — N898 Other specified noninflammatory disorders of vagina: Secondary | ICD-10-CM | POA: Diagnosis not present

## 2017-08-24 DIAGNOSIS — N9089 Other specified noninflammatory disorders of vulva and perineum: Secondary | ICD-10-CM | POA: Diagnosis not present

## 2017-08-24 DIAGNOSIS — Z803 Family history of malignant neoplasm of breast: Secondary | ICD-10-CM

## 2017-08-24 DIAGNOSIS — Z01419 Encounter for gynecological examination (general) (routine) without abnormal findings: Secondary | ICD-10-CM

## 2017-08-24 DIAGNOSIS — H20012 Primary iridocyclitis, left eye: Secondary | ICD-10-CM | POA: Diagnosis not present

## 2017-08-24 NOTE — Patient Instructions (Signed)

## 2017-08-24 NOTE — Progress Notes (Signed)
51 y.o. G2P1011 Divorced. African AmericanF here for annual exam. Patient c/o vaginal irritation and discharge. Symptoms have been going on for a couple of days. The d/c is white, thin, no odor. She feels slight vulvar irritation.   S/P TLH/BS/LO/LSO in 6/18. She developed hemolytic anemia postoperatively, labs have normalized and she will f/u with hematology. Normal bowel and bladder function.  She reports "normal HgbA1C" with her Endocrinologist in the last month.  Sexually active, no pain. She has a new partner in the last 2 months. Using condoms, one time it broke.     Patient's last menstrual period was 12/29/2016.          Sexually active: Yes.    The current method of family planning is status post hysterectomy/ condoms.    Exercising: Yes.    walking treadmill/ GYM Smoker:  no  Health Maintenance: Pap:  07-01-16 WNL NEG HR HPV 12-28-13 WNL  History of abnormal Pap:  no MMG:  09-07-16 WNL  Colonoscopy:  05-25-13 repeat in 15yrs BMD:   Never TDaP:  05-07-15 Gardasil: N/A   reports that  has never smoked. she has never used smokeless tobacco. She reports that she does not drink alcohol or use drugs. She is working in Librarian, academic. Son is 68  Past Medical History:  Diagnosis Date  . ANEMIA, IRON DEFICIENCY   . DIABETES MELLITUS, TYPE II   . Headache(784.0)   . HYPERCHOLESTEROLEMIA   . HYPERTENSION   . Hyperthyroidism    no meds, slightly elevated, MD will recheck in 3 months  . Migraines   . URINARY INCONTINENCE     Past Surgical History:  Procedure Laterality Date  . ABDOMINAL HYSTERECTOMY    . APPENDECTOMY    . BARTHOLIN CYST MARSUPIALIZATION Right 02/22/2017   Procedure: BARTHOLIN CYST MARSUPIALIZATION;  Surgeon: Salvadore Dom, MD;  Location: [facility];  Service: Gynecology;  Laterality: Right;  . CESAREAN SECTION  1998  . CYSTOSCOPY N/A 02/22/2017   Procedure: CYSTOSCOPY;  Surgeon: Salvadore Dom, MD;  Location: [facility];  Service: Gynecology;  Laterality:  N/A;  . LAPAROSCOPIC LYSIS OF ADHESIONS N/A 02/22/2017   Procedure: EXTENSIVE LAPAROSCOPIC LYSIS OF ADHESIONS;  Surgeon: Salvadore Dom, MD;  Location: [facility];  Service: Gynecology;  Laterality: N/A;  Dr. Kieth Brightly   . OOPHORECTOMY Left 02/22/2017   Procedure: OOPHORECTOMY;  Surgeon: Salvadore Dom, MD;  Location: [facility];  Service: Gynecology;  Laterality: Left;  . TONSILLECTOMY AND ADENOIDECTOMY    . TOTAL LAPAROSCOPIC HYSTERECTOMY WITH SALPINGECTOMY Bilateral 02/22/2017   Procedure: HYSTERECTOMY TOTAL LAPAROSCOPIC WITH SALPINGECTOMY;  Surgeon: Salvadore Dom, MD;  Location: [facility];  Service: Gynecology;  Laterality: Bilateral;    Current Outpatient Medications  Medication Sig Dispense Refill  . hydrochlorothiazide (HYDRODIURIL) 25 MG tablet Take 1 tablet (25 mg total) daily by mouth. for high blood pressure 90 tablet 3  . metFORMIN (GLUCOPHAGE-XR) 500 MG 24 hr tablet TAKE (2) TABLETS TWICE DAILY. 360 tablet 3  . SUMAtriptan (IMITREX) 100 MG tablet TAKE 1 TABLET AT ONSET-MAY REPEAT IN 2 HOURS AS NEEDED- NO MORE THAN 2 IN 24 HOURS. 30 tablet 2   No current facility-administered medications for this visit.     Family History  Problem Relation Age of Onset  . Diabetes Father   . Colon cancer Father 39       2002  . Hypertension Father   . Stroke Father   . Heart disease Father        stints  .  Diabetes Mother   . Thyroid disease Mother   . Hypertension Mother   . Breast cancer Maternal Aunt   . Breast cancer Maternal Aunt   . Breast cancer Maternal Aunt   . Breast cancer Maternal Aunt   . Breast cancer Maternal Aunt   Now 6/8 sisters have had breast cancer. Mother without breast cancer. One Aunt was in her late 42's-early 50's, last Aunt was just diagnosed at 49.   Review of Systems  Constitutional: Negative.   HENT: Negative.   Eyes: Negative.   Respiratory: Negative.   Cardiovascular: Negative.   Gastrointestinal: Negative.   Endocrine: Negative.    Genitourinary: Positive for vaginal discharge.       Vaginal itching  Musculoskeletal: Negative.   Skin: Negative.   Allergic/Immunologic: Negative.   Neurological: Negative.   Psychiatric/Behavioral: Negative.     Exam:   BP (!) 162/90 (BP Location: Right Arm, Patient Position: Sitting, Cuff Size: Normal)   Pulse 84   Resp 16   Ht 5\' 6"  (1.676 m)   Wt 158 lb (71.7 kg)   LMP 12/29/2016   BMI 25.50 kg/m   Weight change: @WEIGHTCHANGE @ Height:   Height: 5\' 6"  (167.6 cm)  Ht Readings from Last 3 Encounters:  08/24/17 5\' 6"  (1.676 m)  06/29/17 5\' 7"  (1.702 m)  04/06/17 5\' 7"  (1.702 m)    General appearance: alert, cooperative and appears stated age Head: Normocephalic, without obvious abnormality, atraumatic Neck: no adenopathy, supple, symmetrical, trachea midline and thyroid normal to inspection and palpation Lungs: clear to auscultation bilaterally Cardiovascular: regular rate and rhythm Breasts: normal appearance, no masses or tenderness Abdomen: soft, non-tender; non distended,  no masses,  no organomegaly Extremities: extremities normal, atraumatic, no cyanosis or edema Skin: Skin color, texture, turgor normal. No rashes or lesions Lymph nodes: Cervical, supraclavicular, and axillary nodes normal. No abnormal inguinal nodes palpated Neurologic: Grossly normal   Pelvic: External genitalia:  no lesions              Urethra:  normal appearing urethra with no masses, tenderness or lesions              Bartholins and Skenes: normal                 Vagina: normal appearing vagina with normal color. Increase in thick, white, creamy vaginal d/c              Cervix: absent               Bimanual Exam:  Uterus:  uterus absent              Adnexa: no mass, fullness, tenderness               Rectovaginal: Confirms               Anus:  normal sphincter tone, no lesions  Chaperone was present for exam.  Wet prep: ? clue, no trich, few wbc KOH: no yeast PH: 4.5   A:  Well  Woman with normal exam  Strong FH of breast cancer, ran the TC risk model with the knowledge she had, 27.2% lifetime risk. She will confirm with her mother that she has the right dates  Vaginal d/c and vulvar irritation. Slides not clear    P:   No pap  Mammogram UTD  Discussed 6 month MRI's  Discussed breast self exam  Discussed calcium and vit D intake  Will send affirm  Spent over 10 minutes doing and reviewing the Tyrer-Cuzick risk evaluation tool with the patient

## 2017-08-25 LAB — VAGINITIS/VAGINOSIS, DNA PROBE
CANDIDA SPECIES: NEGATIVE
GARDNERELLA VAGINALIS: NEGATIVE
Trichomonas vaginosis: NEGATIVE

## 2017-08-26 LAB — GC/CHLAMYDIA PROBE AMP
Chlamydia trachomatis, NAA: NEGATIVE
NEISSERIA GONORRHOEAE BY PCR: NEGATIVE

## 2017-08-29 ENCOUNTER — Other Ambulatory Visit (HOSPITAL_BASED_OUTPATIENT_CLINIC_OR_DEPARTMENT_OTHER): Payer: 59

## 2017-08-29 DIAGNOSIS — D500 Iron deficiency anemia secondary to blood loss (chronic): Secondary | ICD-10-CM | POA: Diagnosis not present

## 2017-08-29 DIAGNOSIS — N920 Excessive and frequent menstruation with regular cycle: Secondary | ICD-10-CM

## 2017-08-29 DIAGNOSIS — D591 Autoimmune hemolytic anemia, unspecified: Secondary | ICD-10-CM

## 2017-08-29 LAB — CBC & DIFF AND RETIC
BASO%: 0.7 % (ref 0.0–2.0)
Basophils Absolute: 0 10*3/uL (ref 0.0–0.1)
EOS%: 1.3 % (ref 0.0–7.0)
Eosinophils Absolute: 0.1 10*3/uL (ref 0.0–0.5)
HEMATOCRIT: 39.6 % (ref 34.8–46.6)
HGB: 13.3 g/dL (ref 11.6–15.9)
Immature Retic Fract: 4.2 % (ref 1.60–10.00)
LYMPH#: 1.9 10*3/uL (ref 0.9–3.3)
LYMPH%: 31.3 % (ref 14.0–49.7)
MCH: 29.2 pg (ref 25.1–34.0)
MCHC: 33.6 g/dL (ref 31.5–36.0)
MCV: 87 fL (ref 79.5–101.0)
MONO#: 0.4 10*3/uL (ref 0.1–0.9)
MONO%: 6.5 % (ref 0.0–14.0)
NEUT%: 60.2 % (ref 38.4–76.8)
NEUTROS ABS: 3.6 10*3/uL (ref 1.5–6.5)
PLATELETS: 299 10*3/uL (ref 145–400)
RBC: 4.55 10*6/uL (ref 3.70–5.45)
RDW: 12.9 % (ref 11.2–14.5)
RETIC %: 1.14 % (ref 0.70–2.10)
Retic Ct Abs: 51.87 10*3/uL (ref 33.70–90.70)
WBC: 6 10*3/uL (ref 3.9–10.3)
nRBC: 0 % (ref 0–0)

## 2017-08-29 LAB — LACTATE DEHYDROGENASE: LDH: 129 U/L (ref 125–245)

## 2017-09-01 ENCOUNTER — Ambulatory Visit: Payer: Self-pay | Admitting: Endocrinology

## 2017-09-01 DIAGNOSIS — Z0289 Encounter for other administrative examinations: Secondary | ICD-10-CM

## 2017-09-09 ENCOUNTER — Ambulatory Visit
Admission: RE | Admit: 2017-09-09 | Discharge: 2017-09-09 | Disposition: A | Discharge status: Home / Self Care | Payer: Self-pay | Source: Ambulatory Visit | Attending: Certified Nurse Midwife | Admitting: Certified Nurse Midwife

## 2017-09-09 DIAGNOSIS — Z1231 Encounter for screening mammogram for malignant neoplasm of breast: Secondary | ICD-10-CM

## 2017-10-27 ENCOUNTER — Other Ambulatory Visit: Payer: Self-pay

## 2017-12-26 ENCOUNTER — Telehealth: Payer: Self-pay | Admitting: Hematology

## 2017-12-26 ENCOUNTER — Other Ambulatory Visit: Payer: Self-pay

## 2017-12-26 NOTE — Telephone Encounter (Signed)
Patient called to reschedule  °

## 2017-12-28 ENCOUNTER — Inpatient Hospital Stay: Payer: 59 | Attending: Hematology

## 2017-12-28 DIAGNOSIS — D591 Autoimmune hemolytic anemia, unspecified: Secondary | ICD-10-CM

## 2017-12-28 LAB — CBC WITH DIFFERENTIAL/PLATELET
BASOS ABS: 0.1 10*3/uL (ref 0.0–0.1)
Basophils Relative: 1 %
Eosinophils Absolute: 0.1 10*3/uL (ref 0.0–0.5)
Eosinophils Relative: 2 %
HCT: 37.6 % (ref 34.8–46.6)
HEMOGLOBIN: 12.8 g/dL (ref 11.6–15.9)
LYMPHS ABS: 2.2 10*3/uL (ref 0.9–3.3)
LYMPHS PCT: 32 %
MCH: 29.6 pg (ref 25.1–34.0)
MCHC: 34 g/dL (ref 31.5–36.0)
MCV: 87 fL (ref 79.5–101.0)
Monocytes Absolute: 0.6 10*3/uL (ref 0.1–0.9)
Monocytes Relative: 8 %
NEUTROS ABS: 3.9 10*3/uL (ref 1.5–6.5)
NEUTROS PCT: 57 %
Platelets: 292 10*3/uL (ref 145–400)
RBC: 4.32 MIL/uL (ref 3.70–5.45)
RDW: 12.4 % (ref 11.2–14.5)
WBC: 6.9 10*3/uL (ref 3.9–10.3)

## 2017-12-28 LAB — RETICULOCYTES
RBC.: 4.32 MIL/uL (ref 3.70–5.45)
RETIC COUNT ABSOLUTE: 60.5 10*3/uL (ref 33.7–90.7)
Retic Ct Pct: 1.4 % (ref 0.7–2.1)

## 2017-12-28 LAB — LACTATE DEHYDROGENASE: LDH: 115 U/L — AB (ref 125–245)

## 2018-01-02 ENCOUNTER — Telehealth: Payer: Self-pay

## 2018-01-02 NOTE — Telephone Encounter (Signed)
-----   Message from Truitt Merle, MD sent at 12/31/2017 10:57 PM EDT ----- Please let pt know the lab results, thanks  Truitt Merle  12/31/2017

## 2018-01-02 NOTE — Telephone Encounter (Signed)
Called patient per Dr. Burr Medico with lab results.  Patient verbalized an understanding.

## 2018-02-21 NOTE — Progress Notes (Signed)
[place] [facility]  [phone] [phone]  Clinic follow up Note   Patient Care Team: Renato Shin, MD as PCP - General Regina Eck, CNM as Referring Physician (Certified Nurse Midwife)   Date of Service:  02/24/2018  CHIEF COMPLAINTS:  Follow up autoimmune hemolytic anemia   HISTORY OF PRESENTING ILLNESS:  Michelle Castillo 52 y.o. female is here because of hemolytic anemia.  The patient was recently hospitalized from 03/06/17 - 03/10/17 for autoimmune hemolytic anemia. She underwent hysterectomy on 02/22/17 because of symptomatic fibroid uterus with menorrhagia leading to anemia, and was feeling well until 7/8 when she experienced extreme fatigue. She was originally admitted to [facility], but was transferred to [facility] on 03/08/17. I initially consulted with her as an inpatient on 03/09/17. She was diagnosed with autoimmune hemolytic anemia, and I started her on steroids in the hospital. She responded well, was discharged home on 03/10/17 with follow up appointment in this clinic.  The patient followed up with Dr. Talbert Nan on 03/14/17. The patient reports she has lost her appetite, and has lost 5 lbs in the last week. The patient believes this is caused by Prednisone. She recently tapered this to 60 mg daily. She also reports diarrhea, which may be contributing to her weight loss. She reports she is sleeping well at night. She denies pain. She has almost completed her prescribed course of antibiotics.    CURRENT THERAPY: observation    INTERVAL HISTORY:   Michelle Castillo is here for a follow-up for autoimmune hemolytic anemia. She was last seen by me 10 months ago. She was seen by NP Laice in 05/2018 in interim. She presents to the clinic today by herself. She notes she is doing well overall. She notes she has recovered well with no memory issues. She sees her PCP and Gyn yearly and will see him again in 06/2018.     MEDICAL HISTORY:  Past  Medical History:  Diagnosis Date  . ANEMIA, IRON DEFICIENCY   . DIABETES MELLITUS, TYPE II   . Headache(784.0)   . HYPERCHOLESTEROLEMIA   . HYPERTENSION   . Hyperthyroidism    no meds, slightly elevated, MD will recheck in 3 months  . Migraines   . URINARY INCONTINENCE     SURGICAL HISTORY: Past Surgical History:  Procedure Laterality Date  . ABDOMINAL HYSTERECTOMY    . APPENDECTOMY    . BARTHOLIN CYST MARSUPIALIZATION Right 02/22/2017   Procedure: BARTHOLIN CYST MARSUPIALIZATION;  Surgeon: Salvadore Dom, MD;  Location: [facility];  Service: Gynecology;  Laterality: Right;  . CESAREAN SECTION  1998  . CYSTOSCOPY N/A 02/22/2017   Procedure: CYSTOSCOPY;  Surgeon: Salvadore Dom, MD;  Location: [facility];  Service: Gynecology;  Laterality: N/A;  . LAPAROSCOPIC LYSIS OF ADHESIONS N/A 02/22/2017   Procedure: EXTENSIVE LAPAROSCOPIC LYSIS OF ADHESIONS;  Surgeon: Salvadore Dom, MD;  Location: [facility];  Service: Gynecology;  Laterality: N/A;  Dr. Kieth Brightly   . OOPHORECTOMY Left 02/22/2017   Procedure: OOPHORECTOMY;  Surgeon: Salvadore Dom, MD;  Location: [facility];  Service: Gynecology;  Laterality: Left;  . TONSILLECTOMY AND ADENOIDECTOMY    . TOTAL LAPAROSCOPIC HYSTERECTOMY WITH SALPINGECTOMY Bilateral 02/22/2017   Procedure: HYSTERECTOMY TOTAL LAPAROSCOPIC WITH SALPINGECTOMY;  Surgeon: Salvadore Dom, MD;  Location: [facility];  Service: Gynecology;  Laterality: Bilateral;    SOCIAL HISTORY: Social History   Socioeconomic History  . Marital status: Divorced    Spouse name: Not on file  . Number  of children: 1  . Years of education: Not on file  . Highest education level: Not on file  Occupational History  . Occupation: Development worker, international aid  Social Needs  . Financial resource strain: Not on file  . Food insecurity:    Worry: Not on file    Inability: Not on file  . Transportation needs:    Medical: Not on file    Non-medical: Not on file  Tobacco Use  .  Smoking status: Never Smoker  . Smokeless tobacco: Never Used  Substance and Sexual Activity  . Alcohol use: No    Alcohol/week: 0.0 oz    Frequency: Never  . Drug use: No  . Sexual activity: Yes    Partners: Male    Birth control/protection: Surgical, Condom  Lifestyle  . Physical activity:    Days per week: Not on file    Minutes per session: Not on file  . Stress: Not on file  Relationships  . Social connections:    Talks on phone: Not on file    Gets together: Not on file    Attends religious service: Not on file    Active member of club or organization: Not on file    Attends meetings of clubs or organizations: Not on file    Relationship status: Not on file  . Intimate partner violence:    Fear of current or ex partner: Not on file    Emotionally abused: Not on file    Physically abused: Not on file    Forced sexual activity: Not on file  Other Topics Concern  . Not on file  Social History Narrative   Occupation: Works Orthoptist   Regular exercise-yes          FAMILY HISTORY: Family History  Problem Relation Age of Onset  . Diabetes Father   . Colon cancer Father 64       2002  . Hypertension Father   . Stroke Father   . Heart disease Father        stints  . Diabetes Mother   . Thyroid disease Mother   . Hypertension Mother   . Breast cancer Maternal Aunt   . Breast cancer Maternal Aunt   . Breast cancer Maternal Aunt   . Breast cancer Maternal Aunt   . Breast cancer Maternal Aunt     ALLERGIES:  has No Known Allergies.  MEDICATIONS:  Current Outpatient Medications  Medication Sig Dispense Refill  . hydrochlorothiazide (HYDRODIURIL) 25 MG tablet Take 1 tablet (25 mg total) daily by mouth. for high blood pressure 90 tablet 3  . metFORMIN (GLUCOPHAGE-XR) 500 MG 24 hr tablet TAKE (2) TABLETS TWICE DAILY. 360 tablet 3  . SUMAtriptan (IMITREX) 100 MG tablet TAKE 1 TABLET AT ONSET-MAY REPEAT IN 2 HOURS AS NEEDED- NO MORE THAN 2 IN 24 HOURS. 30  tablet 2   No current facility-administered medications for this visit.     REVIEW OF SYSTEMS:  Constitutional: Denies fevers, pain, chills or abnormal night sweats Eyes: Denies blurriness of vision, double vision or watery eyes Ears, nose, mouth, throat, and face: Denies mucositis or sore throat Respiratory: Denies cough, dyspnea or wheezes Cardiovascular: Denies palpitation, chest discomfort or lower extremity swelling Gastrointestinal:  Denies nausea, heartburn  Skin: Denies abnormal skin rashes Lymphatics: Denies new lymphadenopathy or easy bruising Neurological:Denies numbness, tingling or new weaknesses Behavioral/Psych: (+) depression/anxiety, much improved All other systems were reviewed with the patient and are negative.  PHYSICAL EXAMINATION: ECOG PERFORMANCE STATUS:  1 - Symptomatic but completely ambulatory  Vitals:   02/24/18 1009  BP: (!) 153/93  Pulse: 90  Resp: 18  Temp: 98.7 F (37.1 C)  SpO2: 100%   Filed Weights   02/24/18 1009  Weight: 160 lb 14.4 oz (73 kg)     GENERAL: alert, no distress and comfortable SKIN: skin color, texture, turgor are normal, no rashes or significant lesions EYES: normal, conjunctiva are pink and non-injected, sclera clear OROPHARYNX: no exudate, no erythema and lips, buccal mucosa, and tongue normal  NECK: supple, thyroid normal size, non-tender, without nodularity LYMPH:  no palpable lymphadenopathy in the cervical, axillary or inguinal LUNGS: clear to auscultation and percussion with normal breathing effort HEART: regular rate & rhythm and no murmurs and no lower extremity edema ABDOMEN: abdomen soft, non-tender and normal bowel sounds Musculoskeletal: no cyanosis of digits and no clubbing  PSYCH: alert & oriented x 3 with fluent speech NEURO: no focal motor/sensory deficits  LABORATORY DATA:  I have reviewed the data as listed CBC Latest Ref Rng & Units 02/24/2018 12/28/2017 08/29/2017  WBC 3.9 - 10.3 K/uL 5.1 6.9 6.0    Hemoglobin 11.6 - 15.9 g/dL 13.2 12.8 13.3  Hematocrit 34.8 - 46.6 % 38.7 37.6 39.6  Platelets 145 - 400 K/uL 287 292 299    CMP Latest Ref Rng & Units 06/01/2017 05/04/2017 04/06/2017  Glucose 70 - 140 mg/dl 162(H) 83 97  BUN 7.0 - 26.0 mg/dL 7.8 8.2 8.6  Creatinine 0.6 - 1.1 mg/dL 0.8 0.9 0.9  Sodium 136 - 145 mEq/L 139 139 141  Potassium 3.5 - 5.1 mEq/L 4.0 3.8 3.6  Chloride 101 - 111 mmol/L - - -  CO2 22 - 29 mEq/L 25 27 27   Calcium 8.4 - 10.4 mg/dL 9.7 10.0 10.3  Total Protein 6.4 - 8.3 g/dL 6.4 6.7 6.9  Total Bilirubin 0.20 - 1.20 mg/dL 0.57 0.35 0.51  Alkaline Phos 40 - 150 U/L 46 49 44  AST 5 - 34 U/L 12 13 11   ALT 0 - 55 U/L 12 14 16      RADIOGRAPHIC STUDIES: I have personally reviewed the radiological images as listed and agreed with the findings in the report. No results found.    Diagnostic Mammogram 09/09/17 IMPRESSION: No mammographic evidence of malignancy. A result letter of this screening mammogram will be mailed directly to the patient. RECOMMENDATION: Screening mammogram in one year. (Code:SM-B-01Y)  ASSESSMENT & PLAN:  No problem-specific Assessment & Plan notes found for this encounter.  Michelle Castillo is a 52 y.o. woman with history of iron deficiency due to menorrhagia, recent hysterectomy on 02/22/2017, and was found to have severe anemia and required hospitalization and multiple blood transfusion on 03/06/2017.  1. Autoimmune hemolytic anemia  -We previously reviewed the patient's lab results, which is consistent with hemolytic anemia, Coombs test was positive. -She responded well to prior steroid treatment, anemia resolved. -Due to her worsening anxiety and depression, she was not able tolerate the prednisone and stopped on 03/23/2017. She was on about 15 days only and she responded very well. Her hemolytic anemia resolved.  -We previously discussed her autoimmune hemolytic anemia has resolved at this time but may recur with stress, infection, or  inflammation in the future. We will continue to monitor her labs -Labs reviewed and WNL, she is overall stable with no sign of hemolytic anemia recurrence. I discussed I can discharge her and she can continue to be monitored by her PCP and Gyn with regular labs. -I  discussed if she develops symptoms of another flare she should contact her PCP and me and if significant she should go to the ED.   2. Depression/Anxiety/Weight loss -triggered by steroids, but persistent when she initially stopped prednisone -She does not have thoughts of harming herself.  -Continue Ativan as needed at night. I recommend not to take it in the day as it can make her drowsy -she will continue Celexa  -She was previously referred to our SW and psychiatry -Much improved.   3. History of iron deficiency, secondary to menorrhagia, Leukocytosis -She is status post hysterectomy in June 2018 -She was previously on oral iron. -both resolved now    PLAN  -Lab reviewed, no evidence of anemia or hemolysis.  I recommend her to follow-up with PCP and gynecologist.  I will see her as needed in the future.    All questions were answered. The patient knows to call the clinic with any problems, questions or concerns.  I spent 15 minutes counseling the patient face to face. The total time spent in the appointment was 20 minutes and more than 50% was on counseling.  Oneal Deputy, am acting as scribe for Truitt Merle, MD.   I have reviewed the above documentation for accuracy and completeness, and I agree with the above.      Truitt Merle, MD 02/24/2018

## 2018-02-24 ENCOUNTER — Inpatient Hospital Stay: Payer: 59 | Attending: Hematology | Admitting: Hematology

## 2018-02-24 ENCOUNTER — Inpatient Hospital Stay: Payer: 59

## 2018-02-24 ENCOUNTER — Telehealth: Payer: Self-pay | Admitting: Hematology

## 2018-02-24 ENCOUNTER — Encounter: Payer: Self-pay | Admitting: Hematology

## 2018-02-24 VITALS — BP 153/93 | HR 90 | Temp 98.7°F | Resp 18 | Ht 66.0 in | Wt 160.9 lb

## 2018-02-24 DIAGNOSIS — D591 Autoimmune hemolytic anemia, unspecified: Secondary | ICD-10-CM

## 2018-02-24 DIAGNOSIS — D500 Iron deficiency anemia secondary to blood loss (chronic): Secondary | ICD-10-CM

## 2018-02-24 DIAGNOSIS — N920 Excessive and frequent menstruation with regular cycle: Secondary | ICD-10-CM | POA: Diagnosis not present

## 2018-02-24 LAB — CBC WITH DIFFERENTIAL (CANCER CENTER ONLY)
BASOS ABS: 0.1 10*3/uL (ref 0.0–0.1)
BASOS PCT: 1 %
EOS ABS: 0.1 10*3/uL (ref 0.0–0.5)
Eosinophils Relative: 2 %
HCT: 38.7 % (ref 34.8–46.6)
HEMOGLOBIN: 13.2 g/dL (ref 11.6–15.9)
Lymphocytes Relative: 27 %
Lymphs Abs: 1.4 10*3/uL (ref 0.9–3.3)
MCH: 29.6 pg (ref 25.1–34.0)
MCHC: 34.1 g/dL (ref 31.5–36.0)
MCV: 86.8 fL (ref 79.5–101.0)
MONOS PCT: 9 %
Monocytes Absolute: 0.4 10*3/uL (ref 0.1–0.9)
NEUTROS ABS: 3.1 10*3/uL (ref 1.5–6.5)
NEUTROS PCT: 61 %
Platelet Count: 287 10*3/uL (ref 145–400)
RBC: 4.46 MIL/uL (ref 3.70–5.45)
RDW: 12.5 % (ref 11.2–14.5)
WBC: 5.1 10*3/uL (ref 3.9–10.3)

## 2018-02-24 LAB — LACTATE DEHYDROGENASE: LDH: 97 U/L — AB (ref 98–192)

## 2018-02-24 LAB — RETICULOCYTES
RBC.: 4.46 MIL/uL (ref 3.70–5.45)
RETIC COUNT ABSOLUTE: 49.1 10*3/uL (ref 33.7–90.7)
RETIC CT PCT: 1.1 % (ref 0.7–2.1)

## 2018-02-24 NOTE — Telephone Encounter (Signed)
No LOS 6/28 °

## 2018-04-07 ENCOUNTER — Encounter: Payer: Self-pay | Admitting: Gastroenterology

## 2018-05-26 ENCOUNTER — Encounter: Payer: Self-pay | Admitting: Certified Nurse Midwife

## 2018-05-26 ENCOUNTER — Other Ambulatory Visit: Payer: Self-pay

## 2018-05-26 ENCOUNTER — Ambulatory Visit (INDEPENDENT_AMBULATORY_CARE_PROVIDER_SITE_OTHER): Payer: 59 | Admitting: Certified Nurse Midwife

## 2018-05-26 VITALS — BP 104/64 | HR 68 | Resp 16 | Ht 66.0 in | Wt 161.0 lb

## 2018-05-26 DIAGNOSIS — Z113 Encounter for screening for infections with a predominantly sexual mode of transmission: Secondary | ICD-10-CM | POA: Diagnosis not present

## 2018-05-26 DIAGNOSIS — N898 Other specified noninflammatory disorders of vagina: Secondary | ICD-10-CM | POA: Diagnosis not present

## 2018-05-26 NOTE — Progress Notes (Signed)
52 y.o. Divorced [place] American female [phone] here with complaint of vaginal symptoms of itching, burning, and increase discharge, patient treated self with Monistat and completed 3 days ago. Still having burning and irritation and noted a bump in vagina on left. Had new partner, used condom and was intact on 04/04/18. Patient developed symptoms a week later. Describes discharge as white milky with ? Odor. Denies fever, chills.  Denies new personal products or vaginal dryness. Has STD concerns and would like to have screening, vaginal only. Urinary symptoms none . Contraception is condoms consistent/ hysterectomy. No other concerns today.  Review of Systems  Constitutional: Negative.   HENT: Negative.   Eyes: Negative.   Cardiovascular: Negative.   Gastrointestinal: Negative.   Genitourinary:       Vaginal irritation & discharge  Musculoskeletal: Negative.   Skin: Negative.   Neurological: Negative.   Endo/Heme/Allergies: Negative.     O:Healthy female WDWN Affect: normal, orientation x 3  Exam:Skin : warm and dry Abdomen: soft, non tender, no masses  Inguinal Lymph nodes: no enlargement or tenderness Pelvic exam: External genital: normal female, no blisters or bumps noted. BUS: negative Vagina: white watery clumpy discharge noted. No masses or bumps noted. Area of concern was hymenal ring remnants after patient viewed in mirror. Affirm taken Cervix:surgically absent Uterus: surgically absent Adnexa:normal, non tender, no masses or fullness noted   A:Normal pelvic exam STD screening R/O vaginal infection   P:Discussed findings of normal pelvic exam with abnormal appearing vaginal discharge. Will treat once Affirm in if indicated. . Discussed Aveeno or baking soda sitz bath for comfort. Stressed condom use for STD protection. Avoid oral sexual activity if possible with concerns for HSV. Questions addressed. Lab: Affirm, Gc/chlamydia  Rv prn

## 2018-05-27 LAB — VAGINITIS/VAGINOSIS, DNA PROBE
CANDIDA SPECIES: NEGATIVE
Gardnerella vaginalis: NEGATIVE
Trichomonas vaginosis: NEGATIVE

## 2018-05-27 LAB — GC/CHLAMYDIA PROBE AMP
Chlamydia trachomatis, NAA: NEGATIVE
Neisseria gonorrhoeae by PCR: NEGATIVE

## 2018-05-31 ENCOUNTER — Telehealth: Payer: Self-pay | Admitting: Certified Nurse Midwife

## 2018-05-31 MED ORDER — BETAMETHASONE VALERATE 0.1 % EX OINT: TOPICAL_OINTMENT | CUTANEOUS | 0 refills | Status: DC

## 2018-05-31 NOTE — Telephone Encounter (Signed)
Component     Latest Ref Rng & Units 07/01/2016 01/25/2017 07/04/2017 08/24/2017  Candida species     Negative NEG NOT DETECTED Negative Negative  Trichomonas vaginosis     Negative NEG NOT DETECTED Negative Negative  Gardnerella vaginalis     Negative NEG DETECTED (A) Negative Negative   Component     Latest Ref Rng & Units 05/26/2018  Candida species     Negative Negative  Trichomonas vaginosis     Negative Negative  Gardnerella vaginalis     Negative Negative

## 2018-05-31 NOTE — Telephone Encounter (Signed)
Message left to return call to Triage Nurse at [phone].    

## 2018-05-31 NOTE — Telephone Encounter (Signed)
Patient sent the following correspondence through [place]. Routing to triage to assist patient with request.  Hi,    I am still having the irritation symptoms. I know my results came back negative. There is nothing that can be prescribed . My concern is that this has been going on for weeks now.

## 2018-05-31 NOTE — Telephone Encounter (Signed)
Patient returned call. Patient states that she has been using monistat and oatmeal baths and "they aren't helping" in regards to the irritation. OV offered to patient, but she declines stating she hasn't met her deductible for the year and was just in on Friday. Asking for additional recommendations to help with irritation. RN advised would review with Dr. Talbert Nan and return call. Patient agreeable.   Routing to provider for review.

## 2018-05-31 NOTE — Telephone Encounter (Signed)
Call in valisone ointment 0.1%, apply a pea sized amount topically BID for 1-2 weeks. If that isn't helping she needs to be seen. #15 grams, no refills

## 2018-05-31 NOTE — Telephone Encounter (Signed)
Call to patient. Message given to patient as seen below from Dr. Talbert Nan and patient verbalized understanding. Pharmacy on file confirmed. Prescription for Valisone ointment 0.1%, #15 grams, 0RF sent to pharmacy on file. Instructions on use reviewed with patient and she verbalized understanding. Patient aware to call the office if symptoms do not resolve after 1-2 weeks of trying valisone. Patient agreeable.   Routing to provider and will close encounter.

## 2018-07-18 ENCOUNTER — Ambulatory Visit: Payer: Self-pay | Admitting: Endocrinology

## 2018-07-26 IMAGING — CT CT ABD-PELV W/ CM
2 of 5 series · 15 of 46 positions shown, 17 images · IV contrast (iopamidol)
Comparison: CT the abdomen and pelvis 03/07/2017.

CLINICAL DATA: 50-year-old female with history of splenic lesion
and pelvic abscess. Followup study.

EXAM:
CT ABDOMEN AND PELVIS WITH CONTRAST
TECHNIQUE: Multidetector CT imaging of the abdomen and pelvis was performed
using the standard protocol following bolus administration of
intravenous contrast.
CONTRAST:  100mL [id number] IOPAMIDOL ([id number]) INJECTION 61%

[Series 2: axial st · axial · 0.70mm/px · z∈[+1175,+1560]mm · 12 of 89 slices shown, 14 images]
[im 6/89  soft-tissue]
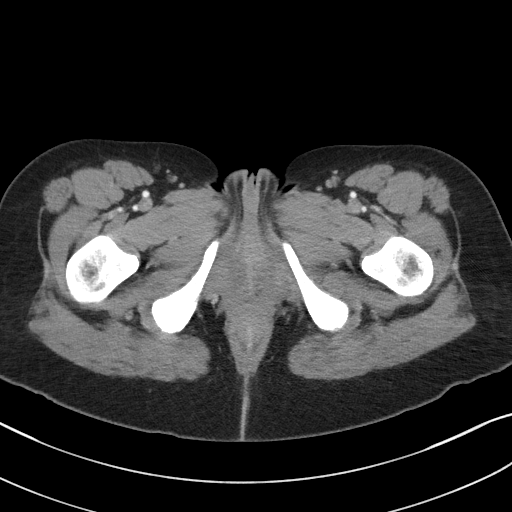
[im 6/89  bone]
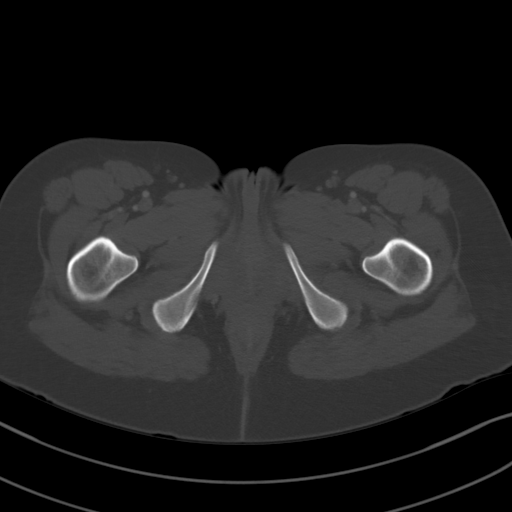
[im 12/89  soft-tissue]
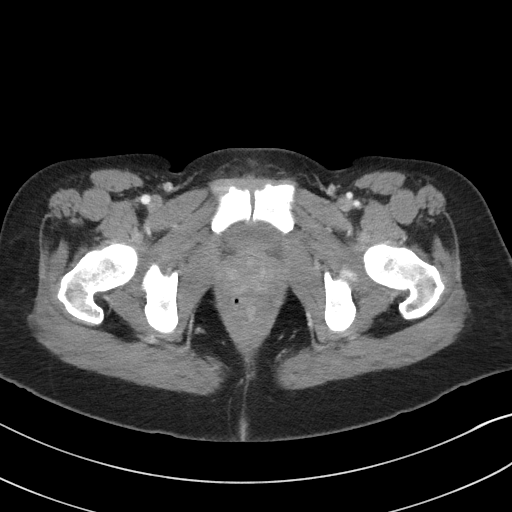
[im 23/89  soft-tissue]
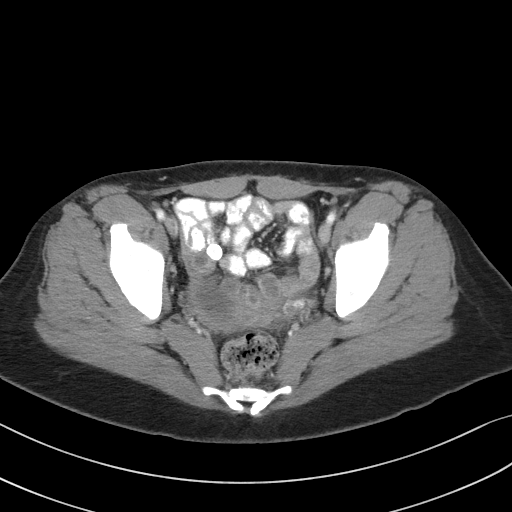
[im 28/89  soft-tissue]
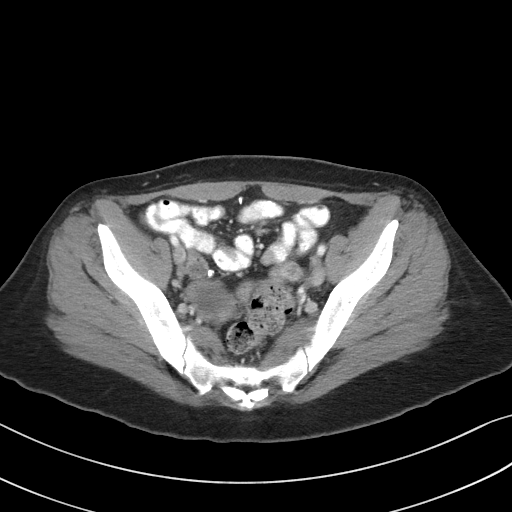
[im 34/89  soft-tissue]
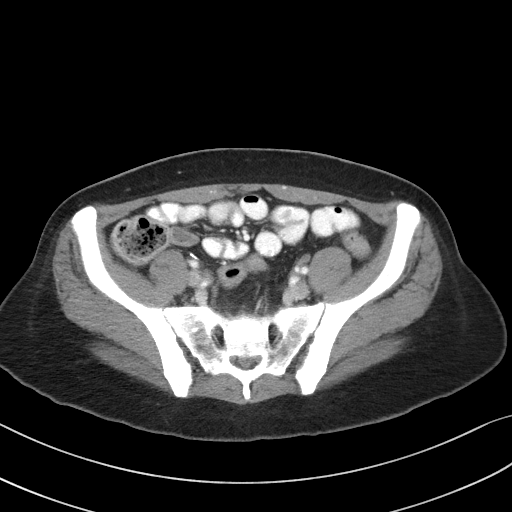
[im 39/89  soft-tissue]
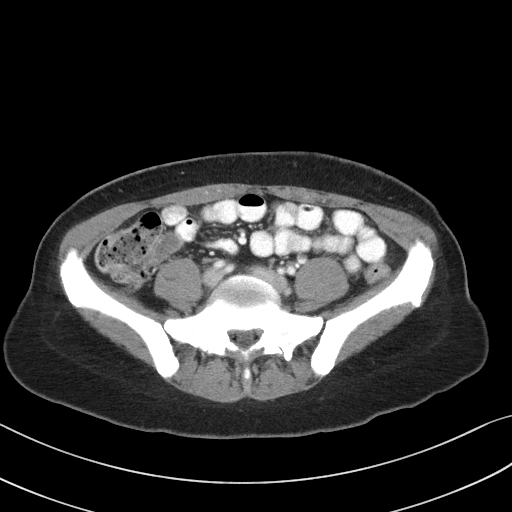
[im 50/89  soft-tissue]
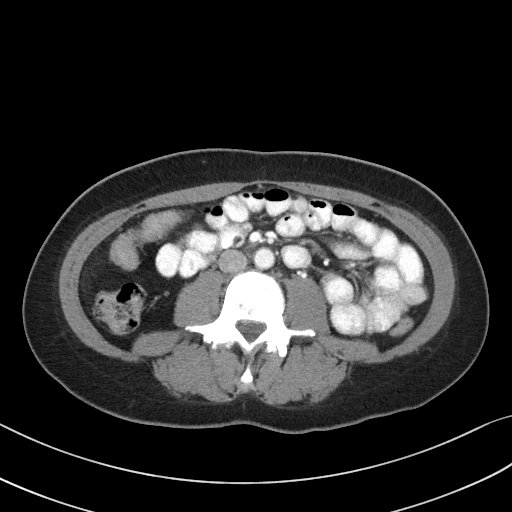
[im 56/89  soft-tissue]
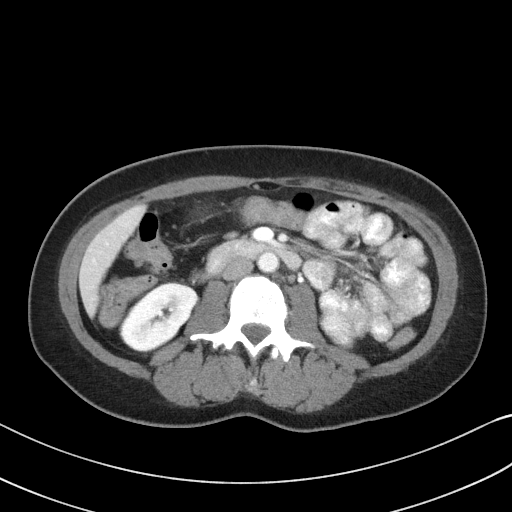
[im 61/89  soft-tissue]
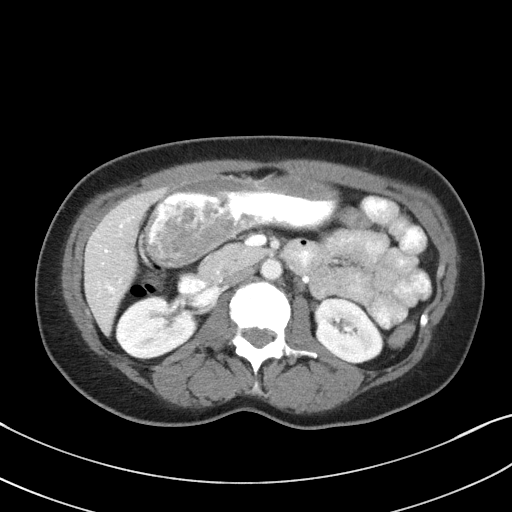
[im 61/89  bone]
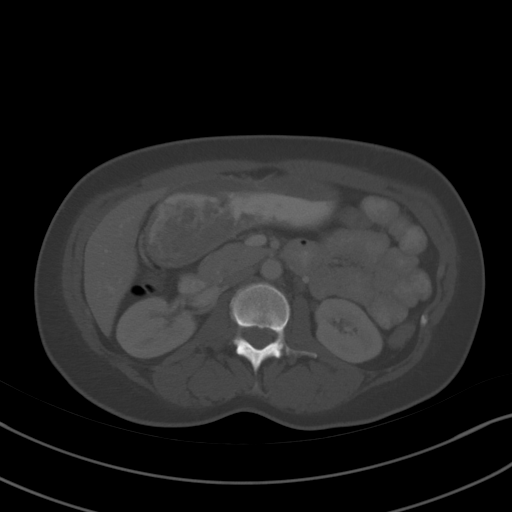
[im 67/89  soft-tissue]
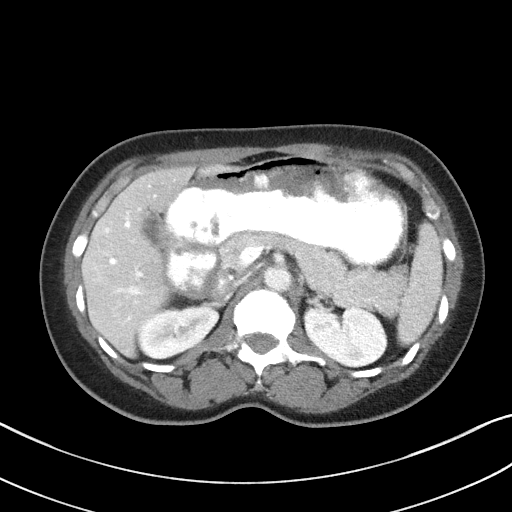
[im 78/89  soft-tissue]
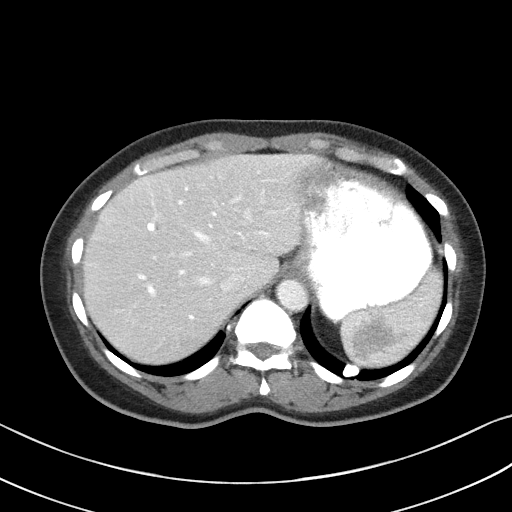
[im 83/89  soft-tissue]
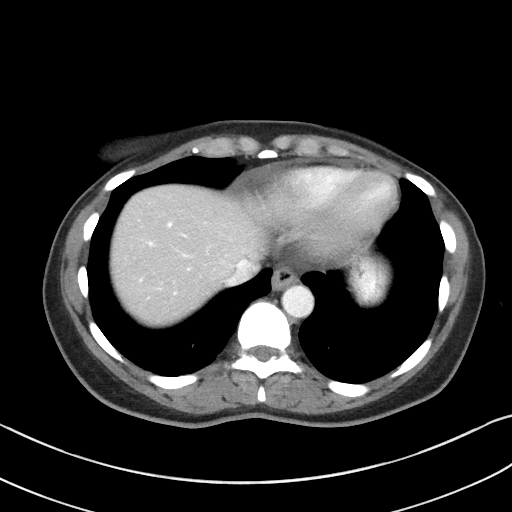

[Series 4: coronal st · coronal · 0.80mm/px · 3 of 63 slices shown]
[im 21/63  soft-tissue]
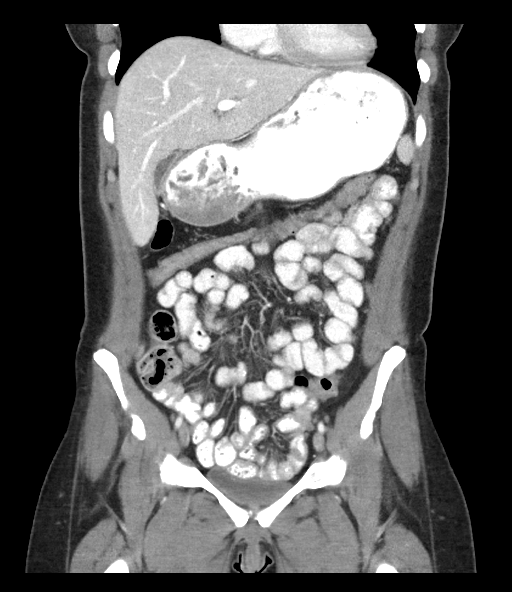
[im 28/63  soft-tissue]
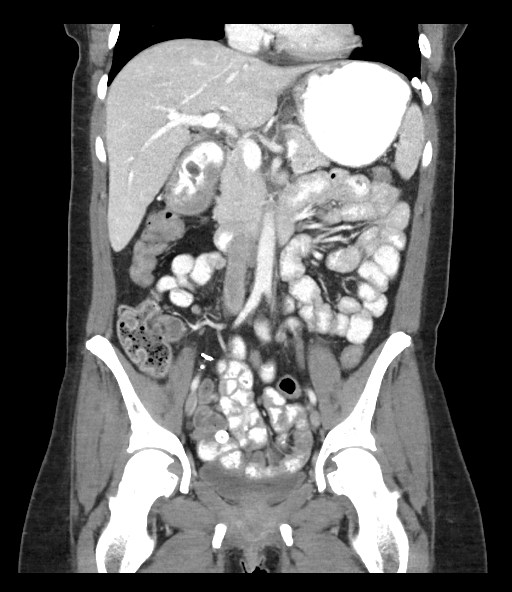
[im 35/63  soft-tissue]
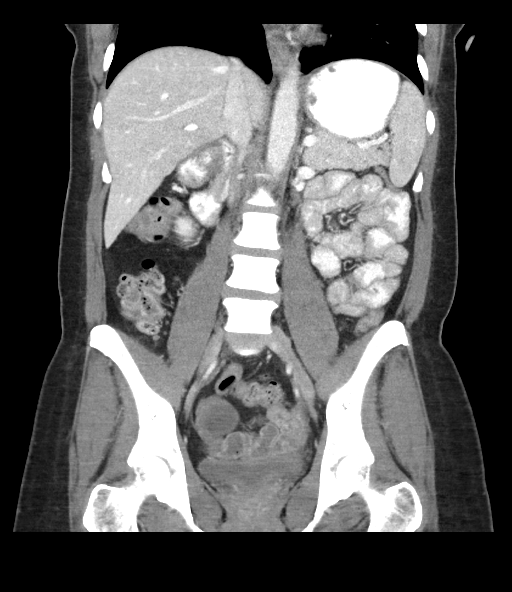

[15 of 46 positions shown; findings below may reference images not displayed]

FINDINGS: Lower chest: Multiple calcified pleural plaques are noted in the
lower thorax bilaterally, indicative of asbestos related pleural
disease. Several pulmonary nodules are noted in the visualized lung
bases, largest of which measures only 3 mm in the right middle lobe
(axial image 19 of series 6).

Hepatobiliary: Small capsular calcification overlying segment 4B of
the liver, unchanged, likely related to remote trauma. No cystic or
solid hepatic lesions. No intra or extrahepatic biliary ductal
dilatation. Gallbladder is nearly decompressed, but otherwise
unremarkable in appearance.

Pancreas: No pancreatic mass. No pancreatic ductal dilatation. No
pancreatic or peripancreatic fluid or inflammatory changes.

Spleen: Partially calcified hypovascular lesion in the medial aspect
of the spleen is similar to slightly smaller than the prior
examination, currently measuring 4.1 x 2.7 cm. This lesion is less
distinct on delayed images, suggesting some progressive enhancement
as can be seen with a hemangioma.

Adrenals/Urinary Tract: Bilateral kidneys and bilateral adrenal
glands are normal in appearance. No hydroureteronephrosis. Urinary
bladder is normal in appearance.

Stomach/Bowel: Stomach is normal in appearance. No pathologic
dilatation of small bowel or colon. Status post appendectomy.

Vascular/Lymphatic: No significant atherosclerotic disease, aneurysm
or dissection noted in the abdominal or pelvic vasculature. Some
calcified mesenteric lymph nodes are noted. No other lymphadenopathy
noted in the abdomen or pelvis.

Reproductive: Status post hysterectomy. Previously noted small fluid
and gas containing collection at or adjacent to the vaginal apex has
resolved. No definite pelvic abscess. Well-defined 3.1 x 4.0 x
cm low-attenuation lesion in the right adnexal lesion, similar to
the prior study, presumably an ovarian cyst.

Other: No significant volume of ascites.  No pneumoperitoneum.

Musculoskeletal: There are no aggressive appearing lytic or blastic
lesions noted in the visualized portions of the skeleton.
IMPRESSION: 1. Previously noted gas and fluid containing collection at or near
the vaginal apex on prior study 03/07/2017 is no longer identified.
No pelvic abscess noted on today's study.
2. Indeterminate hypovascular partially calcified splenic lesion in
the medial aspect of the spleen, stable to slightly decreased in
size compared to the prior study. This is favored to be benign,
likely a hemangioma, and could be further characterized with
nonemergent MRI of the abdomen with and without IV gadolinium if of
clinical concern.
3. Stable appearance of low-attenuation lesion in the right adnexal
region, presumably an ovarian cyst.
4. Calcified pleural plaques in the lower thorax bilaterally
indicative of asbestos related pleural disease.
5. Tiny pulmonary nodules in the visualize lung bases measuring 3 mm
or less in size. This is nonspecific, but statistically likely
benign. No follow-up needed if patient is low-risk (and has no known
or suspected primary neoplasm). Non-contrast chest CT can be
considered in 12 months if patient is high-risk. This recommendation
follows the consensus statement: Guidelines for Management of
Incidental Pulmonary Nodules Detected on CT Images: From the

## 2018-08-03 ENCOUNTER — Ambulatory Visit: Payer: Self-pay | Admitting: Endocrinology

## 2018-08-14 ENCOUNTER — Ambulatory Visit: Payer: Self-pay | Admitting: Endocrinology

## 2018-09-13 ENCOUNTER — Other Ambulatory Visit: Payer: Self-pay | Admitting: Certified Nurse Midwife

## 2018-09-13 DIAGNOSIS — Z1231 Encounter for screening mammogram for malignant neoplasm of breast: Secondary | ICD-10-CM

## 2018-09-14 ENCOUNTER — Encounter: Payer: Self-pay | Admitting: Endocrinology

## 2018-09-14 ENCOUNTER — Ambulatory Visit
Admission: RE | Admit: 2018-09-14 | Discharge: 2018-09-14 | Disposition: A | Discharge status: Home / Self Care | Payer: 59 | Source: Ambulatory Visit | Attending: Certified Nurse Midwife | Admitting: Certified Nurse Midwife

## 2018-09-14 ENCOUNTER — Ambulatory Visit (INDEPENDENT_AMBULATORY_CARE_PROVIDER_SITE_OTHER): Payer: 59 | Admitting: Endocrinology

## 2018-09-14 VITALS — BP 142/84 | HR 90 | Ht 66.0 in | Wt 169.2 lb

## 2018-09-14 DIAGNOSIS — E119 Type 2 diabetes mellitus without complications: Secondary | ICD-10-CM

## 2018-09-14 DIAGNOSIS — R32 Unspecified urinary incontinence: Secondary | ICD-10-CM

## 2018-09-14 DIAGNOSIS — D649 Anemia, unspecified: Secondary | ICD-10-CM | POA: Diagnosis not present

## 2018-09-14 DIAGNOSIS — Z1231 Encounter for screening mammogram for malignant neoplasm of breast: Secondary | ICD-10-CM

## 2018-09-14 LAB — CBC WITH DIFFERENTIAL/PLATELET
Basophils Absolute: 0.1 10*3/uL (ref 0.0–0.1)
Basophils Relative: 1.2 % (ref 0.0–3.0)
Eosinophils Absolute: 0.2 10*3/uL (ref 0.0–0.7)
Eosinophils Relative: 2.7 % (ref 0.0–5.0)
HCT: 38.8 % (ref 36.0–46.0)
Hemoglobin: 13.2 g/dL (ref 12.0–15.0)
Lymphocytes Relative: 33.6 % (ref 12.0–46.0)
Lymphs Abs: 2.3 10*3/uL (ref 0.7–4.0)
MCHC: 34.1 g/dL (ref 30.0–36.0)
MCV: 86 fl (ref 78.0–100.0)
Monocytes Absolute: 0.4 10*3/uL (ref 0.1–1.0)
Monocytes Relative: 5.5 % (ref 3.0–12.0)
Neutro Abs: 3.9 10*3/uL (ref 1.4–7.7)
Neutrophils Relative %: 57 % (ref 43.0–77.0)
Platelets: 337 10*3/uL (ref 150.0–400.0)
RBC: 4.52 Mil/uL (ref 3.87–5.11)
RDW: 12.9 % (ref 11.5–15.5)
WBC: 6.9 10*3/uL (ref 4.0–10.5)

## 2018-09-14 LAB — POCT GLYCOSYLATED HEMOGLOBIN (HGB A1C): Hemoglobin A1C: 6.4 % — AB (ref 4.0–5.6)

## 2018-09-14 LAB — URINALYSIS, ROUTINE W REFLEX MICROSCOPIC
BILIRUBIN URINE: NEGATIVE
Hgb urine dipstick: NEGATIVE
Ketones, ur: NEGATIVE
Leukocytes, UA: NEGATIVE
Nitrite: NEGATIVE
PH: 5.5 (ref 5.0–8.0)
RBC / HPF: NONE SEEN (ref 0–?)
Specific Gravity, Urine: 1.025 (ref 1.000–1.030)
Total Protein, Urine: NEGATIVE
Urine Glucose: NEGATIVE
Urobilinogen, UA: 0.2 (ref 0.0–1.0)
WBC, UA: NONE SEEN (ref 0–?)

## 2018-09-14 LAB — IBC PANEL
Iron: 53 ug/dL (ref 42–145)
SATURATION RATIOS: 18.6 % — AB (ref 20.0–50.0)
Transferrin: 203 mg/dL — ABNORMAL LOW (ref 212.0–360.0)

## 2018-09-14 MED ORDER — MIRABEGRON ER 25 MG PO TB24: 25.0000 mg | ORAL_TABLET | Freq: Every day | ORAL | 11 refills | Status: DC

## 2018-09-14 MED ORDER — SUMATRIPTAN SUCCINATE 100 MG PO TABS: ORAL_TABLET | ORAL | 2 refills | Status: DC

## 2018-09-14 MED ORDER — HYDROCHLOROTHIAZIDE 25 MG PO TABS: 25.0000 mg | ORAL_TABLET | Freq: Every day | ORAL | 3 refills | Status: DC

## 2018-09-14 MED ORDER — METFORMIN HCL ER 500 MG PO TB24: 2000.0000 mg | ORAL_TABLET | Freq: Every day | ORAL | 3 refills | Status: DC

## 2018-09-14 NOTE — Patient Instructions (Addendum)
A urine test is requested for you today.  We'll let you know about the results.  I have sent these prescriptions to your pharmacy: for the bladder, and to refill the other meds.   Best wishes with your new primary care provider.  Here is a list.

## 2018-09-14 NOTE — Progress Notes (Signed)
Subjective:    Patient ID: Michelle Castillo, female    DOB: 09/06/1965, 53 y.o.   MRN: [id number]  HPI Pt returns for f/u of diabetes mellitus: DM type: 2 Dx'ed: 6789 Complications: none Therapy: metformin.  DKA: never Severe hypoglycemia: never Pancreatitis: never Pancreatic imaging: normal on 2018 CT.  Other: she has never been on insulin.  Interval history: she reports urge incontinence, since TAH.   She does not take HCTZ Past Medical History:  Diagnosis Date  . ANEMIA, IRON DEFICIENCY   . DIABETES MELLITUS, TYPE II   . Headache(784.0)   . HYPERCHOLESTEROLEMIA   . HYPERTENSION   . Hyperthyroidism    no meds, slightly elevated, MD will recheck in 3 months  . Migraines   . URINARY INCONTINENCE     Past Surgical History:  Procedure Laterality Date  . ABDOMINAL HYSTERECTOMY    . APPENDECTOMY    . BARTHOLIN CYST MARSUPIALIZATION Right 02/22/2017   Procedure: BARTHOLIN CYST MARSUPIALIZATION;  Surgeon: Salvadore Dom, MD;  Location: [facility];  Service: Gynecology;  Laterality: Right;  . CESAREAN SECTION  1998  . CYSTOSCOPY N/A 02/22/2017   Procedure: CYSTOSCOPY;  Surgeon: Salvadore Dom, MD;  Location: [facility];  Service: Gynecology;  Laterality: N/A;  . LAPAROSCOPIC LYSIS OF ADHESIONS N/A 02/22/2017   Procedure: EXTENSIVE LAPAROSCOPIC LYSIS OF ADHESIONS;  Surgeon: Salvadore Dom, MD;  Location: [facility];  Service: Gynecology;  Laterality: N/A;  Dr. Kieth Brightly   . OOPHORECTOMY Left 02/22/2017   Procedure: OOPHORECTOMY;  Surgeon: Salvadore Dom, MD;  Location: [facility];  Service: Gynecology;  Laterality: Left;  . TONSILLECTOMY AND ADENOIDECTOMY    . TOTAL LAPAROSCOPIC HYSTERECTOMY WITH SALPINGECTOMY Bilateral 02/22/2017   Procedure: HYSTERECTOMY TOTAL LAPAROSCOPIC WITH SALPINGECTOMY;  Surgeon: Salvadore Dom, MD;  Location: [facility];  Service: Gynecology;  Laterality: Bilateral;    Social History   Socioeconomic History  . Marital status: Divorced   Spouse name: Not on file  . Number of children: 1  . Years of education: Not on file  . Highest education level: Not on file  Occupational History  . Occupation: Development worker, international aid  Social Needs  . Financial resource strain: Not on file  . Food insecurity:    Worry: Not on file    Inability: Not on file  . Transportation needs:    Medical: Not on file    Non-medical: Not on file  Tobacco Use  . Smoking status: Never Smoker  . Smokeless tobacco: Never Used  Substance and Sexual Activity  . Alcohol use: No    Alcohol/week: 0.0 standard drinks    Frequency: Never  . Drug use: No  . Sexual activity: Not Currently    Partners: Male    Birth control/protection: Surgical, Condom    Comment: hysterctomy  Lifestyle  . Physical activity:    Days per week: Not on file    Minutes per session: Not on file  . Stress: Not on file  Relationships  . Social connections:    Talks on phone: Not on file    Gets together: Not on file    Attends religious service: Not on file    Active member of club or organization: Not on file    Attends meetings of clubs or organizations: Not on file    Relationship status: Not on file  . Intimate partner violence:    Fear of current or ex partner: Not on file    Emotionally abused: Not on file  Physically abused: Not on file    Forced sexual activity: Not on file  Other Topics Concern  . Not on file  Social History Narrative   Occupation: Works Orthoptist   Regular exercise-yes          Current Outpatient Medications on File Prior to Visit  Medication Sig Dispense Refill  . betamethasone valerate ointment (VALISONE) 0.1 % Apply a pea sized amount topically twice a day for 1-2 weeks 15 g 0   No current facility-administered medications on file prior to visit.     No Known Allergies  Family History  Problem Relation Age of Onset  . Diabetes Father   . Colon cancer Father 46       2002  . Hypertension Father   . Stroke Father   .  Heart disease Father        stints  . Diabetes Mother   . Thyroid disease Mother   . Hypertension Mother   . Breast cancer Maternal Aunt   . Breast cancer Maternal Aunt   . Breast cancer Maternal Aunt   . Breast cancer Maternal Aunt   . Breast cancer Maternal Aunt     BP (!) 142/84 (BP Location: Right Arm, Patient Position: Sitting, Cuff Size: Normal)   Pulse 90   Ht 5\' 6"  (1.676 m)   Wt 169 lb 3.2 oz (76.7 kg)   LMP 12/29/2016   SpO2 94%   BMI 27.31 kg/m   Review of Systems She did not tolerate ditropan in the past (thirst).  She seldom needs imitrex, but it works when she takes it.      Objective:   Physical Exam VITAL SIGNS:  See vs page GENERAL: no distress Pulses: dorsalis pedis intact bilat.   MSK: no deformity of the feet CV: no leg edema Skin:  no ulcer on the feet.  normal color and temp on the feet. Neuro: sensation is intact to touch on the feet  Lab Results  Component Value Date   HGBA1C 6.4 (A) 09/14/2018       Assessment & Plan:  Urinary incont, new Type 2 DM: well-controlled HTN: therapy limited by noncompliance.   Headache: well-controlled   Patient Instructions  A urine test is requested for you today.  We'll let you know about the results.  I have sent these prescriptions to your pharmacy: for the bladder, and to refill the other meds.   Best wishes with your new primary care provider.  Here is a list.

## 2018-09-18 ENCOUNTER — Telehealth: Payer: Self-pay | Admitting: Endocrinology

## 2018-09-18 MED ORDER — FESOTERODINE FUMARATE ER 4 MG PO TB24: 4.0000 mg | ORAL_TABLET | Freq: Every day | ORAL | 11 refills | Status: DC

## 2018-09-18 NOTE — Telephone Encounter (Signed)
LVM requesting returned call 

## 2018-09-18 NOTE — Telephone Encounter (Signed)
please call patient: Ins wants you to change the bladder medication to an alternative.  I have sent a prescription to your pharmacy

## 2018-09-18 NOTE — Telephone Encounter (Signed)
Pt returned call. Informed of new medication. Verbalized acceptance and understanding.

## 2018-10-18 ENCOUNTER — Encounter: Payer: Self-pay | Admitting: Endocrinology

## 2018-10-18 NOTE — Telephone Encounter (Signed)
Please advise 

## 2018-10-19 ENCOUNTER — Other Ambulatory Visit: Payer: Self-pay

## 2018-10-19 DIAGNOSIS — E119 Type 2 diabetes mellitus without complications: Secondary | ICD-10-CM

## 2018-10-19 DIAGNOSIS — R32 Unspecified urinary incontinence: Secondary | ICD-10-CM

## 2018-10-19 DIAGNOSIS — G43809 Other migraine, not intractable, without status migrainosus: Secondary | ICD-10-CM

## 2018-10-19 DIAGNOSIS — I10 Essential (primary) hypertension: Secondary | ICD-10-CM

## 2018-10-19 MED ORDER — SUMATRIPTAN SUCCINATE 100 MG PO TABS: ORAL_TABLET | ORAL | 2 refills | Status: DC

## 2018-10-19 MED ORDER — FESOTERODINE FUMARATE ER 4 MG PO TB24: 4.0000 mg | ORAL_TABLET | Freq: Every day | ORAL | 2 refills | Status: DC

## 2018-10-19 MED ORDER — METFORMIN HCL ER 500 MG PO TB24: 2000.0000 mg | ORAL_TABLET | Freq: Every day | ORAL | 2 refills | Status: DC

## 2018-10-19 MED ORDER — HYDROCHLOROTHIAZIDE 25 MG PO TABS: 25.0000 mg | ORAL_TABLET | Freq: Every day | ORAL | 2 refills | Status: DC

## 2018-10-19 NOTE — Telephone Encounter (Signed)
Please advise what needs refilled

## 2018-12-11 ENCOUNTER — Telehealth: Payer: Self-pay | Admitting: Obstetrics and Gynecology

## 2018-12-11 NOTE — Telephone Encounter (Signed)
Patient is having some vaginal discharge.

## 2018-12-11 NOTE — Telephone Encounter (Signed)
Spoke with patient. Patient reports she is having white vaginal discharge that been a few weeks ago. Used OTC Monistat 7 and had relief for a few days. Symptoms have returned. Denies any itching, irritation, or vaginal odor. Advised will need to be seen in the office for further evaluation. Appointment scheduled for 12/15/2018 at 9:30 am with Dr.Jertson. Declines earlier appointment.  Routing to provider and will close encounter.

## 2018-12-14 ENCOUNTER — Telehealth: Payer: Self-pay | Admitting: Obstetrics and Gynecology

## 2018-12-14 ENCOUNTER — Other Ambulatory Visit: Payer: Self-pay

## 2018-12-14 NOTE — Telephone Encounter (Signed)
Spoke with patient. Patient states that she is having a sore throat due to really bad allergies. Unable to wear a mask as she has a hard time breathing with her allergies. States that this occurs every year. Advised patient with her having white vaginal discharge that started a few weeks ago and returned after use of OTC Monistat 7 she needs to be seen in the office. Advised with symptoms and unable to mask she will need to wait to be seen until she feels better. Patient is asking if she can be prescribed Valisone ointment. States she was prescribed this in 05/31/2018 when she had the same symptoms and all her testing was negative. Requests I review with MD to see what can be done.

## 2018-12-14 NOTE — Telephone Encounter (Signed)
Patient was called for pre-screening and says she has a sore throat because of allergies. She would like a call to see if she can do a visit over the phone.

## 2018-12-14 NOTE — Telephone Encounter (Signed)
I will defer to Dr. Talbert Nan, the patient's primary GYN, to make this decision.   Cc- Dr. Talbert Nan

## 2018-12-15 ENCOUNTER — Ambulatory Visit: Payer: 59 | Admitting: Obstetrics and Gynecology

## 2018-12-15 NOTE — Telephone Encounter (Signed)
Since she has already failed over the counter treatment, I think she needs to be seen. We need to keep her out of the ER or urgent care. Please have her come in. I would ask that she try and wear a bandana or a loose fitting mask. It should not inhibit her ability to get oxygen.

## 2018-12-15 NOTE — Telephone Encounter (Signed)
Spoke with patient. Patient states she is feeling worse today and does not feel comfortable coming in to the office. States she would like to wait and see how she feels over the weekend. If she is feeling better she will call to make an appointment next week. Aware she can be seen at a local urgent care over the weekend if needed.  Routing to provider and will close encounter.

## 2019-01-29 ENCOUNTER — Telehealth: Payer: Self-pay

## 2019-01-29 NOTE — Telephone Encounter (Signed)
Overdue for an appt. Called to schedule an appt. Pt states she would only prefer to be seen once per year or every 6 mo. Advised I would forward her request to Dr. Loanne Drilling. Verbalized acceptance and understanding.

## 2019-01-29 NOTE — Telephone Encounter (Signed)
Called pt and made her aware. Prefer to have her DM managed by Dr. Loanne Drilling annually.

## 2019-01-29 NOTE — Telephone Encounter (Signed)
2 subjects:  1.  Once a year is fine 2.  I would be happy to f/u DM here, or you could ask new PCP to address.

## 2019-03-20 DIAGNOSIS — H30033 Focal chorioretinal inflammation, peripheral, bilateral: Secondary | ICD-10-CM | POA: Insufficient documentation

## 2019-03-20 DIAGNOSIS — H2513 Age-related nuclear cataract, bilateral: Secondary | ICD-10-CM | POA: Insufficient documentation

## 2019-03-20 DIAGNOSIS — H3581 Retinal edema: Secondary | ICD-10-CM | POA: Insufficient documentation

## 2019-04-09 ENCOUNTER — Encounter: Payer: Self-pay | Admitting: Gastroenterology

## 2019-05-11 ENCOUNTER — Other Ambulatory Visit: Payer: Self-pay

## 2019-05-11 ENCOUNTER — Ambulatory Visit (AMBULATORY_SURGERY_CENTER): Payer: Self-pay | Admitting: *Deleted

## 2019-05-11 ENCOUNTER — Encounter: Payer: Self-pay | Admitting: Gastroenterology

## 2019-05-11 VITALS — Temp 97.3°F | Ht 67.0 in | Wt 168.0 lb

## 2019-05-11 DIAGNOSIS — Z800 Family history of malignant neoplasm of digestive organs: Secondary | ICD-10-CM

## 2019-05-11 MED ORDER — NA SULFATE-K SULFATE-MG SULF 17.5-3.13-1.6 GM/177ML PO SOLN: ORAL | 0 refills | Status: DC

## 2019-05-11 NOTE — Progress Notes (Signed)
Patient is here in-person for PV. Patient denies any allergies to eggs or soy. Patient denies any problems with anesthesia/sedation. Patient denies any oxygen use at home. Patient denies taking any diet/weight loss medications or blood thinners. EMMI education assisgned to patient on colonoscopy, this was explained and instructions given to patient. Suprep $15 off coupon given to pt.   Pt is aware that care partner will wait in the car during procedure; if they feel like they will be too hot to wait in the car; they may wait in the lobby.  We want them to wear a mask (we do not have any that we can provide them), practice social distancing, and we will check their temperatures when they get here.  I did remind patient that their care partner needs to stay in the parking lot the entire time. Pt will wear mask into building.

## 2019-05-24 ENCOUNTER — Telehealth: Payer: Self-pay

## 2019-05-24 NOTE — Telephone Encounter (Signed)
Covid-19 screening questions   Do you now or have you had a fever in the last 14 days?  Do you have any respiratory symptoms of shortness of breath or cough now or in the last 14 days?  Do you have any family members or close contacts with diagnosed or suspected Covid-19 in the past 14 days?  Have you been tested for Covid-19 and found to be positive?       

## 2019-05-24 NOTE — Telephone Encounter (Signed)
Pt responded "no" to all screening questions °

## 2019-05-25 ENCOUNTER — Encounter: Payer: 59 | Admitting: Gastroenterology

## 2019-09-07 ENCOUNTER — Other Ambulatory Visit: Payer: Self-pay | Admitting: Certified Nurse Midwife

## 2019-09-07 DIAGNOSIS — Z1231 Encounter for screening mammogram for malignant neoplasm of breast: Secondary | ICD-10-CM

## 2019-09-14 LAB — HM DIABETES EYE EXAM

## 2019-10-16 ENCOUNTER — Ambulatory Visit
Admission: RE | Admit: 2019-10-16 | Discharge: 2019-10-16 | Disposition: A | Discharge status: Home / Self Care | Payer: 59 | Source: Ambulatory Visit | Attending: Certified Nurse Midwife | Admitting: Certified Nurse Midwife

## 2019-10-16 ENCOUNTER — Other Ambulatory Visit: Payer: Self-pay

## 2019-10-16 DIAGNOSIS — Z1231 Encounter for screening mammogram for malignant neoplasm of breast: Secondary | ICD-10-CM

## 2019-10-17 NOTE — Progress Notes (Signed)
Mammogram reviewed negative Density C Repeat yearly with  3 D mammogram as you did this year

## 2019-11-16 ENCOUNTER — Other Ambulatory Visit: Payer: Self-pay | Admitting: Endocrinology

## 2019-11-16 DIAGNOSIS — I10 Essential (primary) hypertension: Secondary | ICD-10-CM

## 2019-11-17 NOTE — Telephone Encounter (Signed)
Please forward refill request to pt's primary care provider.   

## 2019-11-19 ENCOUNTER — Other Ambulatory Visit: Payer: Self-pay

## 2019-11-19 ENCOUNTER — Encounter: Payer: Self-pay | Admitting: Certified Nurse Midwife

## 2019-11-21 ENCOUNTER — Other Ambulatory Visit: Payer: Self-pay

## 2019-11-21 ENCOUNTER — Encounter: Payer: Self-pay | Admitting: Endocrinology

## 2019-11-21 ENCOUNTER — Ambulatory Visit: Payer: 59 | Admitting: Endocrinology

## 2019-11-21 VITALS — BP 120/70 | HR 91 | Ht 67.0 in | Wt 172.6 lb

## 2019-11-21 DIAGNOSIS — E119 Type 2 diabetes mellitus without complications: Secondary | ICD-10-CM

## 2019-11-21 LAB — POCT GLYCOSYLATED HEMOGLOBIN (HGB A1C): Hemoglobin A1C: 6.9 % — AB (ref 4.0–5.6)

## 2019-11-21 LAB — HM DIABETES FOOT EXAM: HM Diabetic Foot Exam: NORMAL

## 2019-11-21 MED ORDER — RYBELSUS 3 MG PO TABS: 3.0000 mg | ORAL_TABLET | Freq: Every day | ORAL | 11 refills | Status: DC

## 2019-11-21 NOTE — Progress Notes (Signed)
Subjective:    Patient ID: Michelle Castillo, female    DOB: August 28, 1966, 54 y.o.   MRN: [id number]  HPI Pt returns for f/u of diabetes mellitus: DM type: 2 Dx'ed: [id number] Complications: none Therapy: metformin.  DKA: never Severe hypoglycemia: never Pancreatitis: never Pancreatic imaging: normal on 2018 CT.  Other: she has never been on insulin.  Interval history: she takes metformin as rx'ed.  pt states she feels well in general. Past Medical History:  Diagnosis Date  . ANEMIA, IRON DEFICIENCY 2018  . DIABETES MELLITUS, TYPE II   . Headache(784.0)   . HYPERCHOLESTEROLEMIA   . HYPERTENSION   . Hyperthyroidism    no meds, slightly elevated, MD will recheck in 3 months  . Migraines   . URINARY INCONTINENCE     Past Surgical History:  Procedure Laterality Date  . ABDOMINAL HYSTERECTOMY    . APPENDECTOMY    . BARTHOLIN CYST MARSUPIALIZATION Right 02/22/2017   Procedure: BARTHOLIN CYST MARSUPIALIZATION;  Surgeon: Salvadore Dom, MD;  Location: [facility];  Service: Gynecology;  Laterality: Right;  . CESAREAN SECTION  1998  . COLONOSCOPY  last 05/25/2013  . CYSTOSCOPY N/A 02/22/2017   Procedure: CYSTOSCOPY;  Surgeon: Salvadore Dom, MD;  Location: [facility];  Service: Gynecology;  Laterality: N/A;  . LAPAROSCOPIC LYSIS OF ADHESIONS N/A 02/22/2017   Procedure: EXTENSIVE LAPAROSCOPIC LYSIS OF ADHESIONS;  Surgeon: Salvadore Dom, MD;  Location: [facility];  Service: Gynecology;  Laterality: N/A;  Dr. Kieth Brightly   . OOPHORECTOMY Left 02/22/2017   Procedure: OOPHORECTOMY;  Surgeon: Salvadore Dom, MD;  Location: [facility];  Service: Gynecology;  Laterality: Left;  . TONSILLECTOMY AND ADENOIDECTOMY    . TOTAL LAPAROSCOPIC HYSTERECTOMY WITH SALPINGECTOMY Bilateral 02/22/2017   Procedure: HYSTERECTOMY TOTAL LAPAROSCOPIC WITH SALPINGECTOMY;  Surgeon: Salvadore Dom, MD;  Location: [facility];  Service: Gynecology;  Laterality: Bilateral;    Social History   Socioeconomic History    . Marital status: Divorced    Spouse name: Not on file  . Number of children: 1  . Years of education: Not on file  . Highest education level: Not on file  Occupational History  . Occupation: Development worker, international aid  Tobacco Use  . Smoking status: Never Smoker  . Smokeless tobacco: Never Used  Substance and Sexual Activity  . Alcohol use: Yes    Alcohol/week: 2.0 standard drinks    Types: 2 Standard drinks or equivalent per week    Comment: coolers per pt  . Drug use: No  . Sexual activity: Not Currently    Partners: Male    Birth control/protection: Surgical, Condom    Comment: hysterctomy  Other Topics Concern  . Not on file  Social History Narrative   Occupation: Works Horticulturist, commercial         Social Determinants of Radio broadcast assistant Strain:   . Difficulty of Paying Living Expenses:   Food Insecurity:   . Worried About Charity fundraiser in the Last Year:   . Arboriculturist in the Last Year:   Transportation Needs:   . Film/video editor (Medical):   Marland Kitchen Lack of Transportation (Non-Medical):   Physical Activity:   . Days of Exercise per Week:   . Minutes of Exercise per Session:   Stress:   . Feeling of Stress :   Social Connections:   . Frequency of Communication with Friends and Family:   . Frequency of Social Gatherings with  Friends and Family:   . Attends Religious Services:   . Active Member of Clubs or Organizations:   . Attends Archivist Meetings:   Marland Kitchen Marital Status:   Intimate Partner Violence:   . Fear of Current or Ex-Partner:   . Emotionally Abused:   Marland Kitchen Physically Abused:   . Sexually Abused:     Current Outpatient Medications on File Prior to Visit  Medication Sig Dispense Refill  . azaTHIOprine (IMURAN) 50 MG tablet Take 25 mg by mouth daily.     . betamethasone valerate ointment (VALISONE) 0.1 % Apply a pea sized amount topically twice a day for 1-2 weeks 15 g 0  . fesoterodine (TOVIAZ) 4 MG TB24  tablet Take 1 tablet (4 mg total) by mouth daily. 90 tablet 2  . FOLIC ACID PO Take 1 mg by mouth daily.     . hydrochlorothiazide (HYDRODIURIL) 25 MG tablet Take 1 tablet (25 mg total) by mouth daily. 90 tablet 2  . metFORMIN (GLUCOPHAGE-XR) 500 MG 24 hr tablet Take 4 tablets (2,000 mg total) by mouth daily. (Patient taking differently: Take 500 mg by mouth 2 (two) times daily. ) 360 tablet 2  . Multiple Vitamin (MULTIVITAMIN) tablet Take 1 tablet by mouth daily.    . Na Sulfate-K Sulfate-Mg Sulf 17.5-3.13-1.6 GM/177ML SOLN Suprep (no substitutions)-TAKE AS DIRECTED. 354 mL 0  . SUMAtriptan (IMITREX) 100 MG tablet TAKE 1 TABLET AT ONSET-MAY REPEAT IN 2 HOURS AS NEEDED- NO MORE THAN 2 IN 24 HOURS. 90 tablet 2   No current facility-administered medications on file prior to visit.    No Known Allergies  Family History  Problem Relation Age of Onset  . Diabetes Father   . Colon cancer Father 40       2002  . Hypertension Father   . Stroke Father   . Heart disease Father        stints  . Diabetes Mother   . Thyroid disease Mother   . Hypertension Mother   . Breast cancer Maternal Aunt   . Breast cancer Maternal Aunt   . Breast cancer Maternal Aunt   . Breast cancer Maternal Aunt   . Breast cancer Maternal Aunt   . Colon polyps Neg Hx   . Esophageal cancer Neg Hx   . Rectal cancer Neg Hx   . Stomach cancer Neg Hx     BP 120/70   Pulse 91   Ht 5\' 7"  (1.702 m)   Wt 172 lb 9.6 oz (78.3 kg)   LMP 12/29/2016   SpO2 99%   BMI 27.03 kg/m   Review of Systems She has gained a few lbs.    Objective:   Physical Exam VITAL SIGNS:  See vs page GENERAL: no distress Pulses: dorsalis pedis intact bilat.   MSK: no deformity of the feet CV: no leg edema Skin:  no ulcer on the feet.  normal color and temp on the feet.  Neuro: sensation is intact to touch on the feet.     Lab Results  Component Value Date   HGBA1C 6.9 (A) 11/21/2019      Assessment & Plan:  Type 2 DM: worse.    Weight gain: pt requests that be considered in her rx.    Patient Instructions  I have sent a prescription to your pharmacy, to add "Rybelsus."  Please call or message [place] if you have no nausea on this, so we can increase it.   Please continue the same metformin.  check your blood sugar once a day.  vary the time of day when you check, between before the 3 meals, and at bedtime.  also check if you have symptoms of your blood sugar being too high or too low.  please keep a record of the readings and bring it to your next appointment here (or you can bring the meter itself).  You can write it on any piece of paper.  please call us sooner if your blood sugar goes below 70, or if you have a lot of readings over 200. Please come back for a follow-up appointment in 3-4 months

## 2019-11-21 NOTE — Patient Instructions (Signed)
I have sent a prescription to your pharmacy, to add "Rybelsus."  Please call or message [place] if you have no nausea on this, so we can increase it.   Please continue the same metformin.   check your blood sugar once a day.  vary the time of day when you check, between before the 3 meals, and at bedtime.  also check if you have symptoms of your blood sugar being too high or too low.  please keep a record of the readings and bring it to your next appointment here (or you can bring the meter itself).  You can write it on any piece of paper.  please call us sooner if your blood sugar goes below 70, or if you have a lot of readings over 200. Please come back for a follow-up appointment in 3-4 months

## 2019-11-27 ENCOUNTER — Encounter: Payer: Self-pay | Admitting: Ophthalmology

## 2019-11-27 ENCOUNTER — Other Ambulatory Visit: Payer: Self-pay

## 2019-11-27 ENCOUNTER — Ambulatory Visit (INDEPENDENT_AMBULATORY_CARE_PROVIDER_SITE_OTHER): Payer: 59 | Admitting: Family Medicine

## 2019-11-27 ENCOUNTER — Encounter: Payer: Self-pay | Admitting: Family Medicine

## 2019-11-27 VITALS — BP 138/88 | HR 84 | Temp 98.3°F | Ht 67.25 in | Wt 170.5 lb

## 2019-11-27 DIAGNOSIS — E0590 Thyrotoxicosis, unspecified without thyrotoxic crisis or storm: Secondary | ICD-10-CM

## 2019-11-27 DIAGNOSIS — G43809 Other migraine, not intractable, without status migrainosus: Secondary | ICD-10-CM

## 2019-11-27 DIAGNOSIS — D500 Iron deficiency anemia secondary to blood loss (chronic): Secondary | ICD-10-CM

## 2019-11-27 DIAGNOSIS — E119 Type 2 diabetes mellitus without complications: Secondary | ICD-10-CM | POA: Diagnosis not present

## 2019-11-27 DIAGNOSIS — I10 Essential (primary) hypertension: Secondary | ICD-10-CM

## 2019-11-27 DIAGNOSIS — R32 Unspecified urinary incontinence: Secondary | ICD-10-CM

## 2019-11-27 DIAGNOSIS — D591 Autoimmune hemolytic anemia, unspecified: Secondary | ICD-10-CM | POA: Diagnosis not present

## 2019-11-27 DIAGNOSIS — Z1211 Encounter for screening for malignant neoplasm of colon: Secondary | ICD-10-CM

## 2019-11-27 DIAGNOSIS — Z800 Family history of malignant neoplasm of digestive organs: Secondary | ICD-10-CM

## 2019-11-27 LAB — LIPID PANEL
Cholesterol: 207 mg/dL — ABNORMAL HIGH (ref 0–200)
HDL: 42 mg/dL (ref >39.00)
LDL Cholesterol: 143 mg/dL — ABNORMAL HIGH (ref 0–99)
NonHDL: 165.24
Total CHOL/HDL Ratio: 5
Triglycerides: 112 mg/dL (ref 0.0–149.0)
VLDL: 22.4 mg/dL (ref 0.0–40.0)

## 2019-11-27 LAB — COMPREHENSIVE METABOLIC PANEL
ALT: 11 U/L (ref 0–35)
AST: 11 U/L (ref 0–37)
Albumin: 4.5 g/dL (ref 3.5–5.2)
Alkaline Phosphatase: 44 U/L (ref 39–117)
BUN: 14 mg/dL (ref 6–23)
CO2: 32 mEq/L (ref 19–32)
Calcium: 9.8 mg/dL (ref 8.4–10.5)
Chloride: 103 mEq/L (ref 96–112)
Creatinine, Ser: 0.84 mg/dL (ref 0.40–1.20)
GFR: 86.3 mL/min (ref >60.00)
Glucose, Bld: 128 mg/dL — ABNORMAL HIGH (ref 70–99)
Potassium: 3.3 mEq/L — ABNORMAL LOW (ref 3.5–5.1)
Sodium: 140 mEq/L (ref 135–145)
Total Bilirubin: 0.6 mg/dL (ref 0.2–1.2)
Total Protein: 7.2 g/dL (ref 6.0–8.3)

## 2019-11-27 LAB — CBC
HCT: 37.9 % (ref 36.0–46.0)
Hemoglobin: 13.1 g/dL (ref 12.0–15.0)
MCHC: 34.4 g/dL (ref 30.0–36.0)
MCV: 85.1 fl (ref 78.0–100.0)
Platelets: 292 10*3/uL (ref 150.0–400.0)
RBC: 4.45 Mil/uL (ref 3.87–5.11)
RDW: 12.8 % (ref 11.5–15.5)
WBC: 4.4 10*3/uL (ref 4.0–10.5)

## 2019-11-27 LAB — TSH: TSH: 0.37 u[IU]/mL (ref 0.35–4.50)

## 2019-11-27 LAB — T4, FREE: Free T4: 0.97 ng/dL (ref 0.60–1.60)

## 2019-11-27 MED ORDER — SUMATRIPTAN SUCCINATE 100 MG PO TABS: ORAL_TABLET | ORAL | 1 refills | Status: AC

## 2019-11-27 MED ORDER — FESOTERODINE FUMARATE ER 4 MG PO TB24: 4.0000 mg | ORAL_TABLET | Freq: Every day | ORAL | 2 refills | Status: DC

## 2019-11-27 MED ORDER — HYDROCHLOROTHIAZIDE 25 MG PO TABS: 25.0000 mg | ORAL_TABLET | Freq: Every day | ORAL | 2 refills | Status: DC

## 2019-11-27 NOTE — Progress Notes (Signed)
Subjective:     Michelle Castillo is a 54 y.o. female presenting for Establish Care (previous PCP Dr Loanne Drilling and also sees him for endocrinology), Discuss colonoscopy, and Discuss checking thyroid per Dr Loanne Drilling     HPI   #colon cancer screening - prior testing was diagnostic and expensive - father dx at age 70   #Family hx of breast cancer - 6 aunts - cousin recently diagnosed - genetic testing was positive  Review of Systems   Social History   Tobacco Use  Smoking Status Never Smoker  Smokeless Tobacco Never Used        Objective:    BP Readings from Last 3 Encounters:  11/27/19 138/88  11/21/19 120/70  09/14/18 (!) 142/84   Wt Readings from Last 3 Encounters:  11/27/19 170 lb 8 oz (77.3 kg)  11/21/19 172 lb 9.6 oz (78.3 kg)  05/11/19 168 lb (76.2 kg)    BP 138/88   Pulse 84   Temp 98.3 F (36.8 C)   Ht 5' 7.25" (1.708 m)   Wt 170 lb 8 oz (77.3 kg)   LMP 12/29/2016   SpO2 98%   BMI 26.51 kg/m    Physical Exam Constitutional:      General: She is not in acute distress.    Appearance: She is well-developed. She is not diaphoretic.  HENT:     Head: Normocephalic and atraumatic.     Right Ear: External ear normal.     Left Ear: External ear normal.     Nose: Nose normal.  Eyes:     General: No scleral icterus.    Conjunctiva/sclera: Conjunctivae normal.  Cardiovascular:     Rate and Rhythm: Normal rate and regular rhythm.     Heart sounds: No murmur.  Pulmonary:     Effort: Pulmonary effort is normal. No respiratory distress.     Breath sounds: Normal breath sounds. No wheezing.  Musculoskeletal:        General: Normal range of motion.     Cervical back: Neck supple.  Lymphadenopathy:     Cervical: No cervical adenopathy.  Skin:    General: Skin is warm and dry.     Capillary Refill: Capillary refill takes less than 2 seconds.  Neurological:     Mental Status: She is alert and oriented to person, place, and time.     Deep Tendon  Reflexes: Reflexes normal.  Psychiatric:        Behavior: Behavior normal.       Diabetic Foot Exam - Simple   Simple Foot Form Diabetic Foot exam was performed with the following findings: Yes 11/27/2019 10:52 AM  Visual Inspection No deformities, no ulcerations, no other skin breakdown bilaterally: Yes Sensation Testing Intact to touch and monofilament testing bilaterally: Yes Pulse Check Posterior Tibialis and Dorsalis pulse intact bilaterally: Yes Comments Left 2nd toe with dark grey/black nail        Assessment & Plan:   Problem List Items Addressed This Visit      Cardiovascular and Mediastinum   Essential hypertension    Continue HCTZ. Well controlled. May want to consider switching to ACE due to diabetes.       Relevant Medications   hydrochlorothiazide (HYDRODIURIL) 25 MG tablet     Endocrine   Hyperthyroidism    Check labs. Not currently on treatment      Relevant Orders   TSH (Completed)   T4, free (Completed)   Diabetes ([place])    Good control,  follows with endocrinology      Relevant Orders   Comprehensive metabolic panel (Completed)   Lipid panel (Completed)     Other   Iron deficiency anemia - Primary   Relevant Orders   CBC (Completed)   URINARY INCONTINENCE   Relevant Medications   fesoterodine (TOVIAZ) 4 MG TB24 tablet   Autoimmune hemolytic anemia    S/p hysterectomy and no longer with treatment. Will check labs.       Relevant Orders   CBC (Completed)    Other Visit Diagnoses    Colon cancer screening       Relevant Orders   Ambulatory referral to gastroenterology for colonoscopy   Family history of colon cancer       Relevant Orders   Ambulatory referral to gastroenterology for colonoscopy   Other migraine without status migrainosus, not intractable       Relevant Medications   hydrochlorothiazide (HYDRODIURIL) 25 MG tablet   SUMAtriptan (IMITREX) 100 MG tablet       Return in about 1 year (around 11/26/2020).  Lesleigh Noe, MD

## 2019-11-27 NOTE — Assessment & Plan Note (Signed)
Check labs. Not currently on treatment

## 2019-11-27 NOTE — Assessment & Plan Note (Signed)
Good control, follows with endocrinology

## 2019-11-27 NOTE — Assessment & Plan Note (Signed)
Continue HCTZ. Well controlled. May want to consider switching to ACE due to diabetes.

## 2019-11-27 NOTE — Assessment & Plan Note (Signed)
S/p hysterectomy and no longer with treatment. Will check labs.

## 2019-11-27 NOTE — Patient Instructions (Signed)
Great to meet you!  Blood work today  Referral for colonoscopy

## 2019-11-28 ENCOUNTER — Telehealth: Payer: Self-pay | Admitting: Family Medicine

## 2019-11-28 DIAGNOSIS — E785 Hyperlipidemia, unspecified: Secondary | ICD-10-CM

## 2019-11-28 DIAGNOSIS — E1169 Type 2 diabetes mellitus with other specified complication: Secondary | ICD-10-CM

## 2019-11-28 DIAGNOSIS — I10 Essential (primary) hypertension: Secondary | ICD-10-CM

## 2019-11-28 MED ORDER — ATORVASTATIN CALCIUM 10 MG PO TABS: 10.0000 mg | ORAL_TABLET | Freq: Every day | ORAL | 3 refills | Status: DC

## 2019-11-28 MED ORDER — LISINOPRIL 10 MG PO TABS: 10.0000 mg | ORAL_TABLET | Freq: Every day | ORAL | 3 refills | Status: DC

## 2019-11-28 NOTE — Telephone Encounter (Signed)
Left voicemail to call back. But will send information via MyChart as well.

## 2019-12-11 ENCOUNTER — Encounter: Payer: Self-pay | Admitting: Gastroenterology

## 2020-01-10 IMAGING — MG DIGITAL SCREENING BILATERAL MAMMOGRAM WITH TOMO AND CAD
8 series · 8 of 24 positions shown · non-contrast
Comparison: Previous exam(s).

CLINICAL DATA: Screening.

EXAM:
DIGITAL SCREENING BILATERAL MAMMOGRAM WITH TOMO AND CAD

[L CC synth-2D]
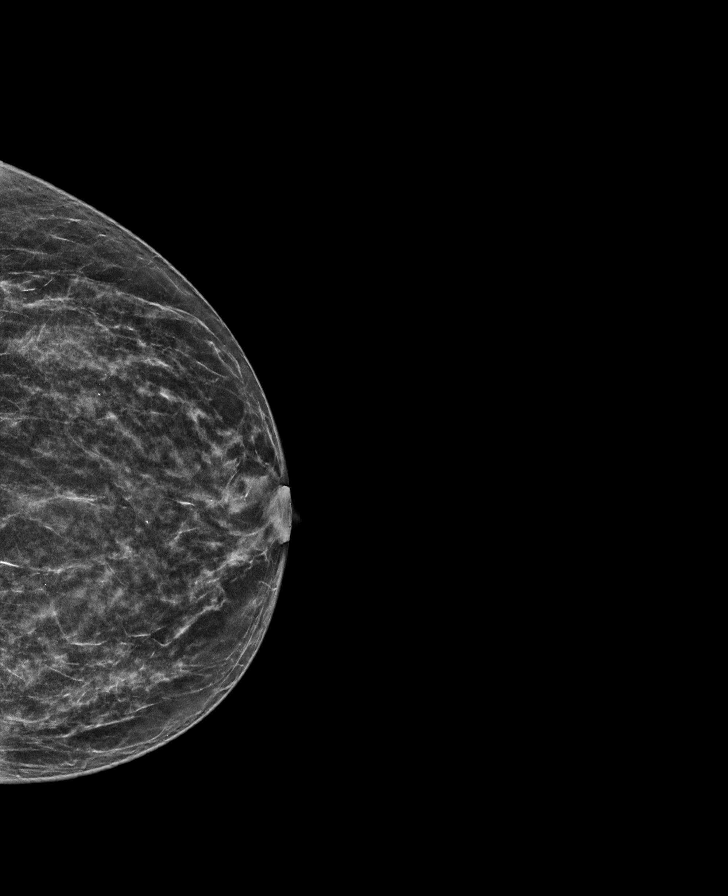

[R MLO synth-2D]
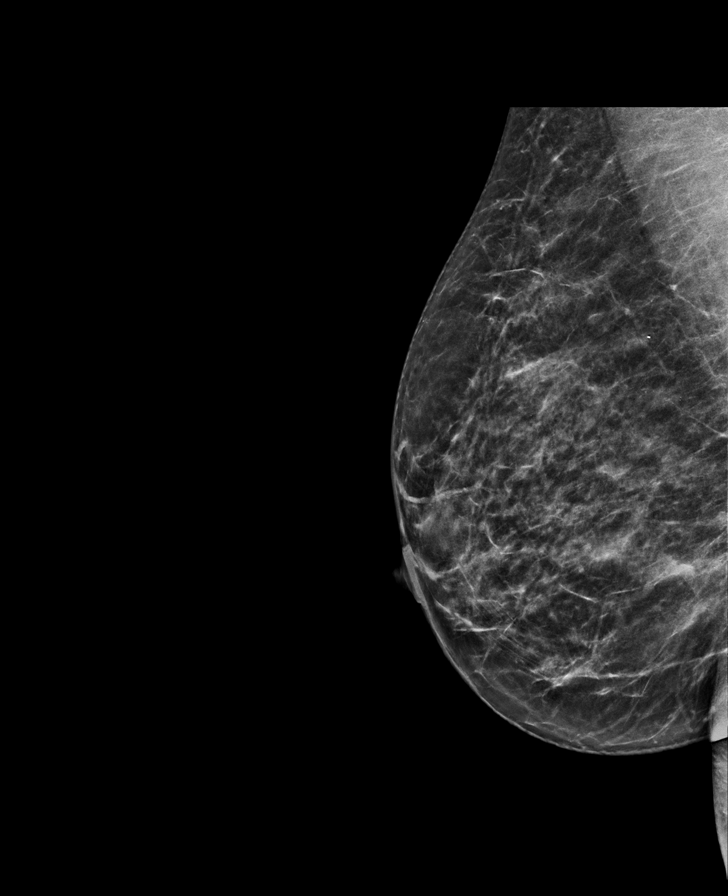

[L MLO synth-2D]
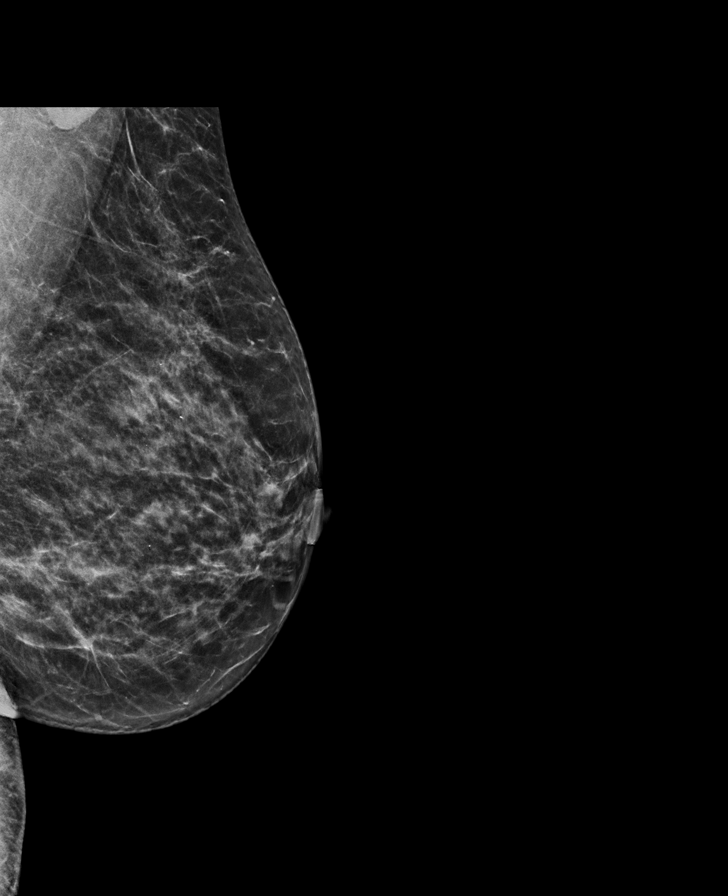

[R CC synth-2D]
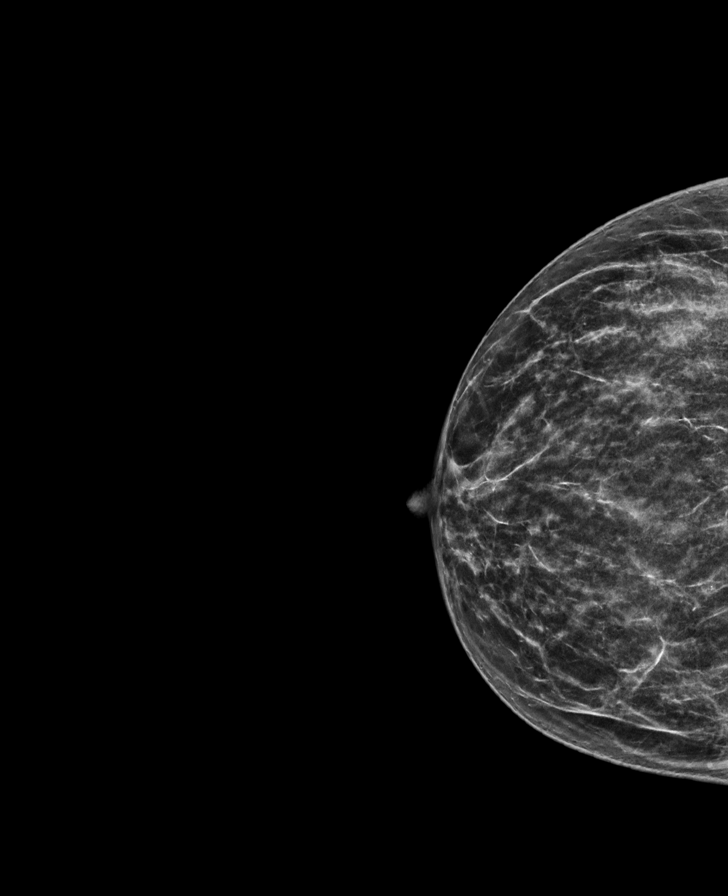

[R MLO tomo · tomo slice 32/63.0]
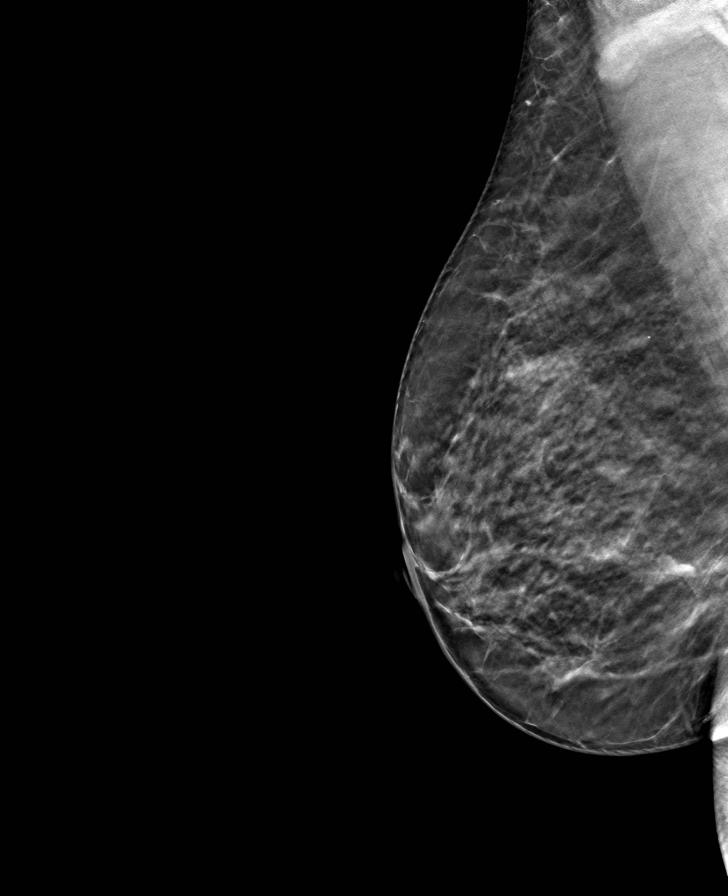

[L CC tomo · tomo slice 29/58.0]
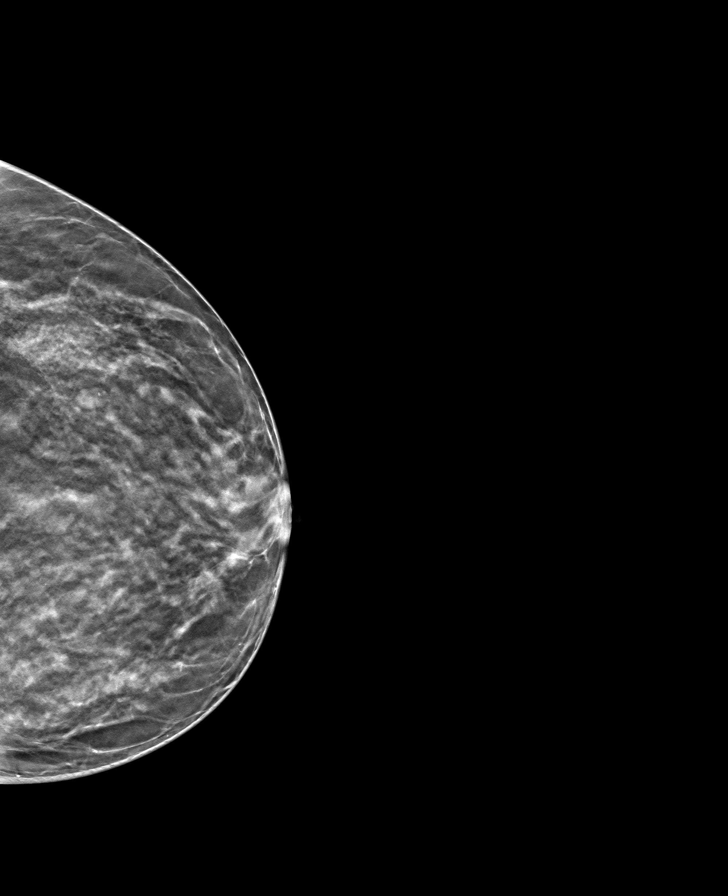

[R CC tomo · tomo slice 31/60.0]
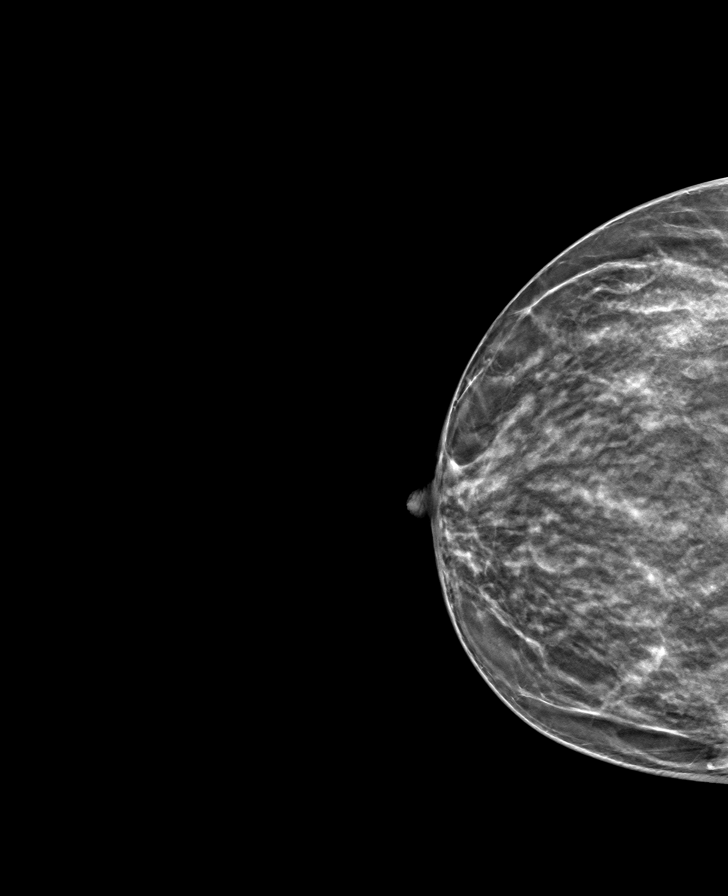

[L MLO tomo · tomo slice 33/65.0]
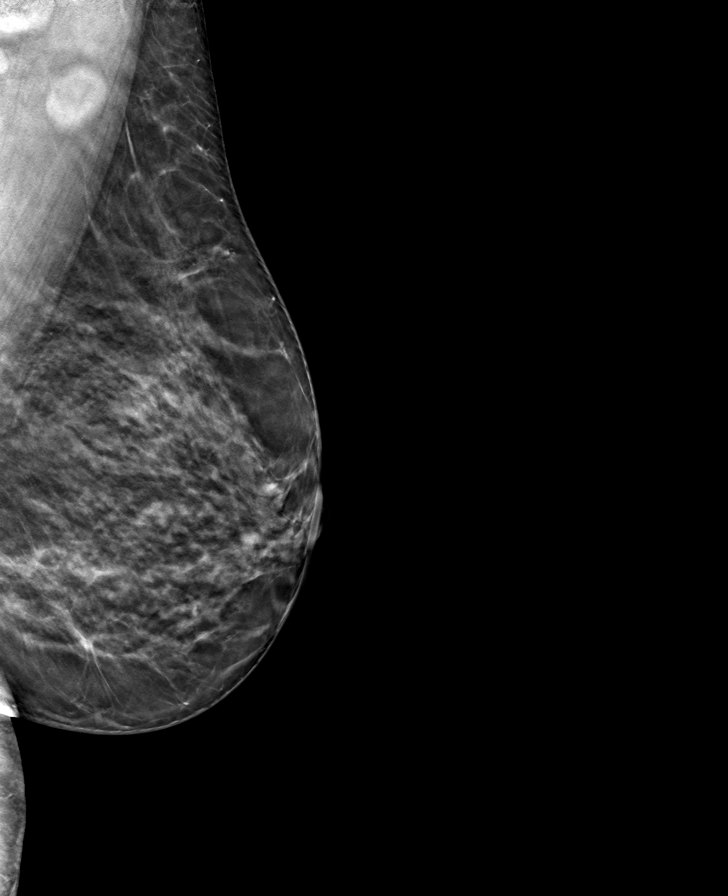

[8 of 24 positions shown; findings below may reference images not displayed]

ACR Breast Density Category c: The breast tissue is heterogeneously
dense, which may obscure small masses.
FINDINGS: There are no findings suspicious for malignancy. Images were
processed with CAD.
IMPRESSION: No mammographic evidence of malignancy. A result letter of this
screening mammogram will be mailed directly to the patient.

RECOMMENDATION:
Screening mammogram in one year. (Code:[id number]-[id number])

BI-RADS CATEGORY  1: Negative.

## 2020-01-14 ENCOUNTER — Other Ambulatory Visit: Payer: Self-pay

## 2020-01-14 ENCOUNTER — Ambulatory Visit (AMBULATORY_SURGERY_CENTER): Payer: Self-pay | Admitting: *Deleted

## 2020-01-14 VITALS — Temp 96.9°F | Ht 67.25 in | Wt 171.0 lb

## 2020-01-14 DIAGNOSIS — Z800 Family history of malignant neoplasm of digestive organs: Secondary | ICD-10-CM

## 2020-01-14 NOTE — Progress Notes (Signed)
Patient is here in-person for PV. Patient denies any allergies to eggs or soy. Patient denies any problems with anesthesia/sedation. Patient denies any oxygen use at home. Patient denies taking any diet/weight loss medications or blood thinners. Patient is not being treated for MRSA or C-diff. Patient is aware of our care-partner policy and 0000000 safety protocol. EMMI education assisgned to the patient for the procedure, this was explained and instructions given to patient.   Patient has Suprep at home. Pt states no changes in history since last GI PV.

## 2020-01-15 ENCOUNTER — Encounter: Payer: Self-pay | Admitting: Gastroenterology

## 2020-01-21 ENCOUNTER — Telehealth: Payer: Self-pay | Admitting: Gastroenterology

## 2020-01-21 NOTE — Telephone Encounter (Signed)
ok 

## 2020-01-21 NOTE — Telephone Encounter (Signed)
Dr. Silverio Decamp  Just wanted to let you know.  This pt has cancelled her appt for Friday 01-25-20  She stated that there are many changes in her place of employement and could not get the time off. Will Call to Reschedule.

## 2020-01-25 ENCOUNTER — Encounter: Payer: 59 | Admitting: Gastroenterology

## 2020-02-27 ENCOUNTER — Ambulatory Visit: Payer: 59 | Admitting: Endocrinology

## 2020-03-10 ENCOUNTER — Other Ambulatory Visit: Payer: Self-pay | Admitting: Endocrinology

## 2020-03-10 DIAGNOSIS — E119 Type 2 diabetes mellitus without complications: Secondary | ICD-10-CM

## 2020-03-31 NOTE — Progress Notes (Signed)
54 y.o. [id number] Divorced Black or [place] American Not Hispanic or Latino female here for annual exam.  Patient states that her breast have been tender for a couple of weeks. She had a hysterectomy in 6/18, still has one ovary. The tenderness in her breasts has been for 2 weeks, bilateral, similar to when she would get her cycles.  She has vaginal irritation from time to time. Some increase in white, thin, vaginal d/c, no odor, itching, burning or irritation. She notices some bumps under her skin.  Sexually active, same partner x 2 years.   S/P TLH/RS/LSO/LOA  in 6/18. She developed hemolytic anemia postoperatively.  Still has her right ovary.  Has done well since her surgery.   She has had issues with inflammation of her eyes, eyes get beet red, tender. Treated with steroids, she stopped secondary to side effects.     H/O DM, last HgbA1C was 6.9  Patient's last menstrual period was 12/29/2016.          Sexually active: Yes.    The current method of family planning is post menopausal status.    Exercising: Yes.    walking Smoker:  no  Health Maintenance: Pap:  07/01/16 WNL, HPV Neg  History of abnormal Pap:  no MMG:  10/17/19 Density C Bi-rads 1 Neg  BMD:   Never  Colonoscopy:05/25/13 , was told to have f/u in 5 years. With her family history of colon cancer, not covered by insurance.  TDaP:  05/07/15 Gardasil: NA   reports that she has never smoked. She has never used smokeless tobacco. She reports current alcohol use of about 2.0 standard drinks of alcohol per week. She reports that she does not use drugs. She has a new job, Scientist, research (physical sciences). Son is grown.   Past Medical History:  Diagnosis Date  . ANEMIA, IRON DEFICIENCY 2018  . DIABETES MELLITUS, TYPE II   . Headache(784.0)   . HYPERCHOLESTEROLEMIA   . HYPERTENSION   . Hyperthyroidism    no meds, slightly elevated, MD will recheck in 3 months  . Migraines   . URINARY INCONTINENCE     Past Surgical  History:  Procedure Laterality Date  . ABDOMINAL HYSTERECTOMY    . APPENDECTOMY    . BARTHOLIN CYST MARSUPIALIZATION Right 02/22/2017   Procedure: BARTHOLIN CYST MARSUPIALIZATION;  Surgeon: Salvadore Dom, MD;  Location: [facility];  Service: Gynecology;  Laterality: Right;  . CESAREAN SECTION  1998  . COLONOSCOPY  last 05/25/2013  . CYSTOSCOPY N/A 02/22/2017   Procedure: CYSTOSCOPY;  Surgeon: Salvadore Dom, MD;  Location: [facility];  Service: Gynecology;  Laterality: N/A;  . LAPAROSCOPIC LYSIS OF ADHESIONS N/A 02/22/2017   Procedure: EXTENSIVE LAPAROSCOPIC LYSIS OF ADHESIONS;  Surgeon: Salvadore Dom, MD;  Location: [facility];  Service: Gynecology;  Laterality: N/A;  Dr. Kieth Brightly   . OOPHORECTOMY Left 02/22/2017   Procedure: OOPHORECTOMY;  Surgeon: Salvadore Dom, MD;  Location: [facility];  Service: Gynecology;  Laterality: Left;  . TONSILLECTOMY AND ADENOIDECTOMY    . TOTAL LAPAROSCOPIC HYSTERECTOMY WITH SALPINGECTOMY Bilateral 02/22/2017   Procedure: HYSTERECTOMY TOTAL LAPAROSCOPIC WITH SALPINGECTOMY;  Surgeon: Salvadore Dom, MD;  Location: [facility];  Service: Gynecology;  Laterality: Bilateral;    Current Outpatient Medications  Medication Sig Dispense Refill  . atorvastatin (LIPITOR) 10 MG tablet Take 1 tablet (10 mg total) by mouth daily. 90 tablet 3  . azaTHIOprine (IMURAN) 50 MG tablet Take 25 mg by mouth daily.     Marland Kitchen  fesoterodine (TOVIAZ) 4 MG TB24 tablet Take 1 tablet (4 mg total) by mouth daily. 90 tablet 2  . FOLIC ACID PO Take 1 mg by mouth daily.     Marland Kitchen lisinopril (ZESTRIL) 10 MG tablet Take 1 tablet (10 mg total) by mouth daily. 90 tablet 3  . metFORMIN (GLUCOPHAGE-XR) 500 MG 24 hr tablet TAKE 4 TABLETS DAILY. 120 tablet 0  . Multiple Vitamin (MULTIVITAMIN) tablet Take 1 tablet by mouth daily.    . prednisoLONE acetate (PRED FORTE) 1 % ophthalmic suspension     . SUMAtriptan (IMITREX) 100 MG tablet TAKE 1 TABLET AT ONSET-MAY REPEAT IN 2 HOURS AS NEEDED- NO MORE  THAN 2 IN 24 HOURS. 10 tablet 1   No current facility-administered medications for this visit.    Family History  Problem Relation Age of Onset  . Diabetes Father   . Colon cancer Father 48       2002  . Hypertension Father   . Stroke Father 95  . Heart disease Father        stints  . Diabetes Mother   . Thyroid disease Mother   . Hypertension Mother   . Breast cancer Maternal Aunt   . Breast cancer Maternal Aunt   . Breast cancer Maternal Aunt   . Breast cancer Maternal Aunt   . Breast cancer Maternal Aunt   . Diabetes Brother   . Thyroid disease Brother   . Breast cancer Maternal Aunt   . Colon polyps Neg Hx   . Esophageal cancer Neg Hx   . Rectal cancer Neg Hx   . Stomach cancer Neg Hx     Review of Systems  Genitourinary: Positive for vaginal discharge.  All other systems reviewed and are negative.   Exam:   BP (!) 180/102   Pulse 100   Ht 5\' 6"  (1.676 m)   Wt 177 lb (80.3 kg)   LMP 12/29/2016   SpO2 100%   BMI 28.57 kg/m   Weight change: @WEIGHTCHANGE @ Height:   Height: 5\' 6"  (167.6 cm)  Ht Readings from Last 3 Encounters:  04/01/20 5\' 6"  (1.676 m)  01/14/20 5' 7.25" (1.708 m)  11/27/19 5' 7.25" (1.708 m)    General appearance: alert, cooperative and appears stated age Head: Normocephalic, without obvious abnormality, atraumatic Neck: no adenopathy, supple, symmetrical, trachea midline and thyroid normal to inspection and palpation Lungs: clear to auscultation bilaterally Cardiovascular: regular rate and rhythm Breasts: normal appearance, no masses or tenderness Abdomen: soft, non-tender; non distended,  no masses,  no organomegaly Extremities: extremities normal, atraumatic, no cyanosis or edema Skin: Skin color, texture, turgor normal. No rashes or lesions Lymph nodes: Cervical, supraclavicular, and axillary nodes normal. No abnormal inguinal nodes palpated Neurologic: Grossly normal   Pelvic: External genitalia:  no lesions               Urethra:  normal appearing urethra with no masses, tenderness or lesions              Bartholins and Skenes: normal                 Vagina: normal appearing vagina with a large amount of thick and creamy as well as clumpy vaginal discharge              Cervix: absent               Bimanual Exam:  Uterus:  uterus absent  Adnexa: no mass, fullness, tenderness               Rectovaginal: Confirms               Anus:  normal sphincter tone, no lesions  Gae Dry chaperoned for the exam.  Wet prep: ?+ clue, no trich, +++ wbc KOH: no yeast PH: 5   A:  Well Woman with normal exam  HTN, elevated BP. Will recheck  Vaginal discharge, slides suspicious for BV, exam suspicious for yeast  Overdue for colonoscopy (trouble with coverage).  Bilateral, symmetrical breast tenderness. Suspect hormonal. Call if it doesn't resolve.   P:   No pap needed  STD testing  Vaginitis testing, will treat based on results  Mammogram UTD  Needs colonoscopy.  I've reached out to GI, if unable to get coverage will have her do IFOB  Discussed breast self exam  Discussed calcium and vit D intake

## 2020-04-01 ENCOUNTER — Encounter: Payer: Self-pay | Admitting: Obstetrics and Gynecology

## 2020-04-01 ENCOUNTER — Other Ambulatory Visit: Payer: Self-pay

## 2020-04-01 ENCOUNTER — Ambulatory Visit (INDEPENDENT_AMBULATORY_CARE_PROVIDER_SITE_OTHER): Payer: 59 | Admitting: Obstetrics and Gynecology

## 2020-04-01 VITALS — BP 180/102 | HR 100 | Ht 66.0 in | Wt 177.0 lb

## 2020-04-01 DIAGNOSIS — Z800 Family history of malignant neoplasm of digestive organs: Secondary | ICD-10-CM | POA: Diagnosis not present

## 2020-04-01 DIAGNOSIS — Z01419 Encounter for gynecological examination (general) (routine) without abnormal findings: Secondary | ICD-10-CM | POA: Diagnosis not present

## 2020-04-01 DIAGNOSIS — Z113 Encounter for screening for infections with a predominantly sexual mode of transmission: Secondary | ICD-10-CM

## 2020-04-01 DIAGNOSIS — N898 Other specified noninflammatory disorders of vagina: Secondary | ICD-10-CM | POA: Diagnosis not present

## 2020-04-01 DIAGNOSIS — N644 Mastodynia: Secondary | ICD-10-CM | POA: Diagnosis not present

## 2020-04-01 NOTE — Patient Instructions (Signed)
EXERCISE AND DIET:  We recommended that you start or continue a regular exercise program for good health. Regular exercise means any activity that makes your heart beat faster and makes you sweat.  We recommend exercising at least 30 minutes per day at least 3 days a week, preferably 4 or 5.  We also recommend a diet low in fat and sugar.  Inactivity, poor dietary choices and obesity can cause diabetes, heart attack, stroke, and kidney damage, among others.    ALCOHOL AND SMOKING:  Women should limit their alcohol intake to no more than 7 drinks/beers/glasses of wine (combined, not each!) per week. Moderation of alcohol intake to this level decreases your risk of breast cancer and liver damage. And of course, no recreational drugs are part of a healthy lifestyle.  And absolutely no smoking or even second hand smoke. Most people know smoking can cause heart and lung diseases, but did you know it also contributes to weakening of your bones? Aging of your skin?  Yellowing of your teeth and nails?  CALCIUM AND VITAMIN D:  Adequate intake of calcium and Vitamin D are recommended.  The recommendations for exact amounts of these supplements seem to change often, but generally speaking 1,200 mg of calcium (between diet and supplement) and 800 units of Vitamin D per day seems prudent. Certain women may benefit from higher intake of Vitamin D.  If you are among these women, your doctor will have told you during your visit.    PAP SMEARS:  Pap smears, to check for cervical cancer or precancers,  have traditionally been done yearly, although recent scientific advances have shown that most women can have pap smears less often.  However, every woman still should have a physical exam from her gynecologist every year. It will include a breast check, inspection of the vulva and vagina to check for abnormal growths or skin changes, a visual exam of the cervix, and then an exam to evaluate the size and shape of the uterus and  ovaries.  And after 52 years of age, a rectal exam is indicated to check for rectal cancers. We will also provide age appropriate advice regarding health maintenance, like when you should have certain vaccines, screening for sexually transmitted diseases, bone density testing, colonoscopy, mammograms, etc.   MAMMOGRAMS:  All women over 40 years old should have a yearly mammogram. Many facilities now offer a "3D" mammogram, which may cost around $50 extra out of pocket. If possible,  we recommend you accept the option to have the 3D mammogram performed.  It both reduces the number of women who will be called back for extra views which then turn out to be normal, and it is better than the routine mammogram at detecting truly abnormal areas.    COLON CANCER SCREENING: Now recommend starting at age 45. At this time colonoscopy is not covered for routine screening until 50. There are take home tests that can be done between 45-49.   COLONOSCOPY:  Colonoscopy to screen for colon cancer is recommended for all women at age 50.  We know, you hate the idea of the prep.  We agree, BUT, having colon cancer and not knowing it is worse!!  Colon cancer so often starts as a polyp that can be seen and removed at colonscopy, which can quite literally save your life!  And if your first colonoscopy is normal and you have no family history of colon cancer, most women don't have to have it again for   10 years.  Once every ten years, you can do something that may end up saving your life, right?  We will be happy to help you get it scheduled when you are ready.  Be sure to check your insurance coverage so you understand how much it will cost.  It may be covered as a preventative service at no cost, but you should check your particular policy.      Breast Self-Awareness Breast self-awareness means being familiar with how your breasts look and feel. It involves checking your breasts regularly and reporting any changes to your  health care provider. Practicing breast self-awareness is important. A change in your breasts can be a sign of a serious medical problem. Being familiar with how your breasts look and feel allows you to find any problems early, when treatment is more likely to be successful. All women should practice breast self-awareness, including women who have had breast implants. How to do a breast self-exam One way to learn what is normal for your breasts and whether your breasts are changing is to do a breast self-exam. To do a breast self-exam: Look for Changes  1. Remove all the clothing above your waist. 2. Stand in front of a mirror in a room with good lighting. 3. Put your hands on your hips. 4. Push your hands firmly downward. 5. Compare your breasts in the mirror. Look for differences between them (asymmetry), such as: ? Differences in shape. ? Differences in size. ? Puckers, dips, and bumps in one breast and not the other. 6. Look at each breast for changes in your skin, such as: ? Redness. ? Scaly areas. 7. Look for changes in your nipples, such as: ? Discharge. ? Bleeding. ? Dimpling. ? Redness. ? A change in position. Feel for Changes Carefully feel your breasts for lumps and changes. It is best to do this while lying on your back on the floor and again while sitting or standing in the shower or tub with soapy water on your skin. Feel each breast in the following way:  Place the arm on the side of the breast you are examining above your head.  Feel your breast with the other hand.  Start in the nipple area and make  inch (2 cm) overlapping circles to feel your breast. Use the pads of your three middle fingers to do this. Apply light pressure, then medium pressure, then firm pressure. The light pressure will allow you to feel the tissue closest to the skin. The medium pressure will allow you to feel the tissue that is a little deeper. The firm pressure will allow you to feel the tissue  close to the ribs.  Continue the overlapping circles, moving downward over the breast until you feel your ribs below your breast.  Move one finger-width toward the center of the body. Continue to use the  inch (2 cm) overlapping circles to feel your breast as you move slowly up toward your collarbone.  Continue the up and down exam using all three pressures until you reach your armpit.  Write Down What You Find  Write down what is normal for each breast and any changes that you find. Keep a written record with breast changes or normal findings for each breast. By writing this information down, you do not need to depend only on memory for size, tenderness, or location. Write down where you are in your menstrual cycle, if you are still menstruating. If you are having trouble noticing differences   in your breasts, do not get discouraged. With time you will become more familiar with the variations in your breasts and more comfortable with the exam. How often should I examine my breasts? Examine your breasts every month. If you are breastfeeding, the best time to examine your breasts is after a feeding or after using a breast pump. If you menstruate, the best time to examine your breasts is 5-7 days after your period is over. During your period, your breasts are lumpier, and it may be more difficult to notice changes. When should I see my health care provider? See your health care provider if you notice:  A change in shape or size of your breasts or nipples.  A change in the skin of your breast or nipples, such as a reddened or scaly area.  Unusual discharge from your nipples.  A lump or thick area that was not there before.  Pain in your breasts.  Anything that concerns you.  

## 2020-04-02 ENCOUNTER — Telehealth: Payer: Self-pay | Admitting: *Deleted

## 2020-04-02 NOTE — Telephone Encounter (Signed)
8-16 colon instructions to Pt via my chart and mail - ok per Dr Carlean Purl per Juliann Pulse, scheduler to not have another PV

## 2020-04-03 ENCOUNTER — Telehealth: Payer: Self-pay

## 2020-04-03 ENCOUNTER — Other Ambulatory Visit: Payer: Self-pay | Admitting: Obstetrics and Gynecology

## 2020-04-03 ENCOUNTER — Encounter: Payer: Self-pay | Admitting: Obstetrics and Gynecology

## 2020-04-03 LAB — NUSWAB VAGINITIS PLUS (VG+)
Atopobium vaginae: HIGH Score — AB
Candida albicans, NAA: NEGATIVE
Candida glabrata, NAA: NEGATIVE
Chlamydia trachomatis, NAA: NEGATIVE
Neisseria gonorrhoeae, NAA: NEGATIVE
Trich vag by NAA: NEGATIVE

## 2020-04-03 MED ORDER — METRONIDAZOLE 500 MG PO TABS: 500.0000 mg | ORAL_TABLET | Freq: Two times a day (BID) | ORAL | 0 refills | Status: DC

## 2020-04-03 NOTE — Telephone Encounter (Signed)
Routing to Dr. Talbert Nan to review 04/01/20 labs and advise.

## 2020-04-03 NOTE — Telephone Encounter (Signed)
[name] [facility] Hello,   I was wondering if you received my labs results back and what medication I would need to take.   Thanks,   [name]

## 2020-04-10 ENCOUNTER — Other Ambulatory Visit: Payer: Self-pay

## 2020-04-10 ENCOUNTER — Ambulatory Visit: Payer: 59 | Admitting: Family Medicine

## 2020-04-10 ENCOUNTER — Encounter: Payer: Self-pay | Admitting: Family Medicine

## 2020-04-10 VITALS — BP 150/82 | HR 90 | Temp 96.9°F | Ht 66.0 in | Wt 173.5 lb

## 2020-04-10 DIAGNOSIS — I10 Essential (primary) hypertension: Secondary | ICD-10-CM

## 2020-04-10 DIAGNOSIS — E7800 Pure hypercholesterolemia, unspecified: Secondary | ICD-10-CM

## 2020-04-10 DIAGNOSIS — E785 Hyperlipidemia, unspecified: Secondary | ICD-10-CM

## 2020-04-10 DIAGNOSIS — E1169 Type 2 diabetes mellitus with other specified complication: Secondary | ICD-10-CM | POA: Diagnosis not present

## 2020-04-10 LAB — LIPID PANEL
Cholesterol: 143 mg/dL (ref 0–200)
HDL: 55.5 mg/dL (ref >39.00)
LDL Cholesterol: 76 mg/dL (ref 0–99)
NonHDL: 87.69
Total CHOL/HDL Ratio: 3
Triglycerides: 59 mg/dL (ref 0.0–149.0)
VLDL: 11.8 mg/dL (ref 0.0–40.0)

## 2020-04-10 LAB — CBC
HCT: 40.2 % (ref 36.0–46.0)
Hemoglobin: 13.5 g/dL (ref 12.0–15.0)
MCHC: 33.5 g/dL (ref 30.0–36.0)
MCV: 86.5 fl (ref 78.0–100.0)
Platelets: 331 10*3/uL (ref 150.0–400.0)
RBC: 4.65 Mil/uL (ref 3.87–5.11)
RDW: 13.6 % (ref 11.5–15.5)
WBC: 4.4 10*3/uL (ref 4.0–10.5)

## 2020-04-10 LAB — COMPREHENSIVE METABOLIC PANEL
ALT: 17 U/L (ref 0–35)
AST: 18 U/L (ref 0–37)
Albumin: 4.6 g/dL (ref 3.5–5.2)
Alkaline Phosphatase: 58 U/L (ref 39–117)
BUN: 9 mg/dL (ref 6–23)
CO2: 29 mEq/L (ref 19–32)
Calcium: 9.9 mg/dL (ref 8.4–10.5)
Chloride: 105 mEq/L (ref 96–112)
Creatinine, Ser: 0.91 mg/dL (ref 0.40–1.20)
GFR: 78.57 mL/min (ref >60.00)
Glucose, Bld: 152 mg/dL — ABNORMAL HIGH (ref 70–99)
Potassium: 3.7 mEq/L (ref 3.5–5.1)
Sodium: 139 mEq/L (ref 135–145)
Total Bilirubin: 0.5 mg/dL (ref 0.2–1.2)
Total Protein: 7.5 g/dL (ref 6.0–8.3)

## 2020-04-10 LAB — HEMOGLOBIN A1C: Hgb A1c MFr Bld: 6.8 % — ABNORMAL HIGH (ref 4.6–6.5)

## 2020-04-10 MED ORDER — LISINOPRIL 20 MG PO TABS: 20.0000 mg | ORAL_TABLET | Freq: Every day | ORAL | 3 refills | Status: DC

## 2020-04-10 NOTE — Assessment & Plan Note (Signed)
Recheck lipids to see if statin helping. Consider dose increase based on results. Goal LDL <70

## 2020-04-10 NOTE — Assessment & Plan Note (Signed)
BP elevated. Suspect this is due to medication change. Increase lisinopril 10 mg > 20 mg. Home monitoring. If elevated call/[facility]. Discussed option of using Maxzide (given hypoK) vs amlodipine if bp still elevated. Recheck kidney labs.

## 2020-04-10 NOTE — Assessment & Plan Note (Signed)
Previous HgbA1c 6.9. Will repeat today given elevated BP. Pt taking metformin and working on weight. Discussed benefit of ACEi for kidney protection. Cont statin

## 2020-04-10 NOTE — Patient Instructions (Signed)
Start taking lisinopril 20 mg   MyChart or call in 2 weeks if blood pressure at home is still high   To check your blood pressure 1) Sit in a quiet and relaxed place for 5 minutes 2) Make sure your feet are flat on the ground 3) Consider checking first thing in the morning   Normal blood pressure is less than 140/90 Ideally you blood pressure should be around 120/80

## 2020-04-10 NOTE — Progress Notes (Signed)
Subjective:     Michelle Castillo is a 54 y.o. female presenting for Hypertension (was high during an appt on 8/3, after 2 readings )     HPI  #HTN - usually well controlled  - will get slight headaches - but gets HA occasionally - does not check her BP at home - no swelling since stopping HCTZ - no cp, sob  #DM - sees Dr. Loanne Drilling yearly - for diabetes - on metformin - does not check at home - working on weight  Has been walking more  Review of Systems   Social History   Tobacco Use  Smoking Status Never Smoker  Smokeless Tobacco Never Used        Objective:    BP Readings from Last 3 Encounters:  04/10/20 (!) 150/82  04/01/20 (!) 180/102  11/27/19 138/88   Wt Readings from Last 3 Encounters:  04/10/20 173 lb 8 oz (78.7 kg)  04/01/20 177 lb (80.3 kg)  01/14/20 171 lb (77.6 kg)    BP (!) 150/82   Pulse 90   Temp (!) 96.9 F (36.1 C) (Temporal)   Ht 5\' 6"  (1.676 m)   Wt 173 lb 8 oz (78.7 kg)   LMP 12/29/2016   SpO2 98%   BMI 28.00 kg/m    Physical Exam Constitutional:      General: She is not in acute distress.    Appearance: She is well-developed. She is not diaphoretic.  HENT:     Right Ear: External ear normal.     Left Ear: External ear normal.     Nose: Nose normal.  Eyes:     Conjunctiva/sclera: Conjunctivae normal.  Cardiovascular:     Rate and Rhythm: Normal rate and regular rhythm.     Heart sounds: No murmur heard.   Pulmonary:     Effort: Pulmonary effort is normal. No respiratory distress.     Breath sounds: Normal breath sounds. No wheezing.  Musculoskeletal:     Cervical back: Neck supple.  Skin:    General: Skin is warm and dry.     Capillary Refill: Capillary refill takes less than 2 seconds.  Neurological:     Mental Status: She is alert. Mental status is at baseline.  Psychiatric:        Mood and Affect: Mood normal.        Behavior: Behavior normal.           Assessment & Plan:   Problem List Items  Addressed This Visit      Cardiovascular and Mediastinum   Essential hypertension    BP elevated. Suspect this is due to medication change. Increase lisinopril 10 mg > 20 mg. Home monitoring. If elevated call/[facility]. Discussed option of using Maxzide (given hypoK) vs amlodipine if bp still elevated. Recheck kidney labs.       Relevant Medications   lisinopril (ZESTRIL) 20 MG tablet   Other Relevant Orders   Comprehensive metabolic panel   Lipid panel   CBC     Endocrine   Diabetes (HCC)    Previous HgbA1c 6.9. Will repeat today given elevated BP. Pt taking metformin and working on weight. Discussed benefit of ACEi for kidney protection. Cont statin      Relevant Medications   lisinopril (ZESTRIL) 20 MG tablet   Other Relevant Orders   Hemoglobin A1c     Other   HYPERCHOLESTEROLEMIA    Recheck lipids to see if statin helping. Consider dose increase based on results.  Goal LDL <70      Relevant Medications   lisinopril (ZESTRIL) 20 MG tablet    Other Visit Diagnoses    Hyperlipidemia associated with type 2 diabetes mellitus ([place])    -  Primary   Relevant Medications   lisinopril (ZESTRIL) 20 MG tablet   Other Relevant Orders   Lipid panel       Return in about 3 months (around 07/11/2020).  Lesleigh Noe, MD  This visit occurred during the SARS-CoV-2 public health emergency.  Safety protocols were in place, including screening questions prior to the visit, additional usage of staff PPE, and extensive cleaning of exam room while observing appropriate contact time as indicated for disinfecting solutions.

## 2020-04-11 ENCOUNTER — Encounter: Payer: Self-pay | Admitting: Obstetrics and Gynecology

## 2020-04-14 ENCOUNTER — Ambulatory Visit (AMBULATORY_SURGERY_CENTER): Payer: 59 | Admitting: Internal Medicine

## 2020-04-14 ENCOUNTER — Encounter: Payer: Self-pay | Admitting: Internal Medicine

## 2020-04-14 ENCOUNTER — Other Ambulatory Visit: Payer: Self-pay

## 2020-04-14 ENCOUNTER — Encounter: Payer: 59 | Admitting: Internal Medicine

## 2020-04-14 ENCOUNTER — Telehealth: Payer: Self-pay | Admitting: Obstetrics and Gynecology

## 2020-04-14 VITALS — BP 154/89 | HR 73 | Temp 97.5°F | Resp 14 | Ht 67.0 in | Wt 171.0 lb

## 2020-04-14 DIAGNOSIS — Z86010 Personal history of colonic polyps: Secondary | ICD-10-CM

## 2020-04-14 DIAGNOSIS — K635 Polyp of colon: Secondary | ICD-10-CM

## 2020-04-14 DIAGNOSIS — Z800 Family history of malignant neoplasm of digestive organs: Secondary | ICD-10-CM | POA: Diagnosis not present

## 2020-04-14 DIAGNOSIS — D125 Benign neoplasm of sigmoid colon: Secondary | ICD-10-CM

## 2020-04-14 MED ORDER — SODIUM CHLORIDE 0.9 % IV SOLN: 500.0000 mL | Freq: Once | INTRAVENOUS | Status: DC

## 2020-04-14 MED ORDER — FLUCONAZOLE 150 MG PO TABS: 150.0000 mg | ORAL_TABLET | Freq: Once | ORAL | 0 refills | Status: AC

## 2020-04-14 NOTE — Telephone Encounter (Signed)
Will call in diflucan, she should f/u up if her symptoms persist after treatment.

## 2020-04-14 NOTE — Telephone Encounter (Signed)
Spoke with pt. Pt states having yeast sx of vaginal discharge that is white and chunky that she noticed on Friday when whiping after finishing Flagyl Rx. Pt denies any odor, itching, fever, chills.  Pt wanting to know if Dr Talbert Nan will call in Diflucan Rx? Pt states she discussed with Dr Talbert Nan at time of appt on 04/01/20 due to sensitivity of yeast after abx use.   Advised will review with Dr Talbert Nan and return call. Pt agreeable. Pharmacy verified.   Routing to Dr Talbert Nan

## 2020-04-14 NOTE — Progress Notes (Signed)
Called to room to assist during endoscopic procedure.  Patient ID and intended procedure confirmed with present staff. Received instructions for my participation in the procedure from the performing physician.  

## 2020-04-14 NOTE — Telephone Encounter (Signed)
Michelle Castillo [facility] I just finished taking my last medication this morning however it appears that my discharge is worse. It looks like a yeast infection.

## 2020-04-14 NOTE — Patient Instructions (Addendum)
I found and removed 2 tiny polyps that look benign. I will let you know pathology results and when to have another routine colonoscopy by mail and/or My Chart. Should be in 5 years again.  I appreciate the opportunity to care for you. Gatha Mayer, MD, [facility]  Handout on polyps given   YOU HAD AN ENDOSCOPIC PROCEDURE TODAY AT [place]:   Refer to the procedure report that was given to you for any specific questions about what was found during the examination.  If the procedure report does not answer your questions, please call your gastroenterologist to clarify.  If you requested that your care partner not be given the details of your procedure findings, then the procedure report has been included in a sealed envelope for you to review at your convenience later.  YOU SHOULD EXPECT: Some feelings of bloating in the abdomen. Passage of more gas than usual.  Walking can help get rid of the air that was put into your GI tract during the procedure and reduce the bloating. If you had a lower endoscopy (such as a colonoscopy or flexible sigmoidoscopy) you may notice spotting of blood in your stool or on the toilet paper. If you underwent a bowel prep for your procedure, you may not have a normal bowel movement for a few days.  Please Note:  You might notice some irritation and congestion in your nose or some drainage.  This is from the oxygen used during your procedure.  There is no need for concern and it should clear up in a day or so.  SYMPTOMS TO REPORT IMMEDIATELY:   Following lower endoscopy (colonoscopy or flexible sigmoidoscopy):  Excessive amounts of blood in the stool  Significant tenderness or worsening of abdominal pains  Swelling of the abdomen that is new, acute  Fever of 100F or higher   For urgent or emergent issues, a gastroenterologist can be reached at any hour by calling [phone]. Do not use MyChart messaging for urgent concerns.    DIET:  We do  recommend a small meal at first, but then you may proceed to your regular diet.  Drink plenty of fluids but you should avoid alcoholic beverages for 24 hours.  ACTIVITY:  You should plan to take it easy for the rest of today and you should NOT DRIVE or use heavy machinery until tomorrow (because of the sedation medicines used during the test).    FOLLOW UP: Our staff will call the number listed on your records 48-72 hours following your procedure to check on you and address any questions or concerns that you may have regarding the information given to you following your procedure. If we do not reach you, we will leave a message.  We will attempt to reach you two times.  During this call, we will ask if you have developed any symptoms of COVID 19. If you develop any symptoms (ie: fever, flu-like symptoms, shortness of breath, cough etc.) before then, please call [phone].  If you test positive for Covid 19 in the 2 weeks post procedure, please call and report this information to [place].    If any biopsies were taken you will be contacted by phone or by letter within the next 1-3 weeks.  Please call us at [phone] if you have not heard about the biopsies in 3 weeks.    SIGNATURES/CONFIDENTIALITY: You and/or your care partner have signed paperwork which will be entered into your electronic medical record.  These signatures attest to the fact that that the information above on your After Visit Summary has been reviewed and is understood.  Full responsibility of the confidentiality of this discharge information lies with you and/or your care-partner.

## 2020-04-14 NOTE — Op Note (Signed)
[place] Patient Name: Michelle Castillo Procedure Date: 04/14/2020 2:57 PM MRN: [id number] Endoscopist: Gatha Mayer , MD Age: 54 Referring MD:  Date of Birth: 09-21-1965 Gender: Female Account #: [id number] Procedure:                Colonoscopy Indications:              Screening in patient at increased risk: Colorectal                            cancer in father 55 or older Medicines:                Propofol per Anesthesia, Monitored Anesthesia Care Procedure:                Pre-Anesthesia Assessment:                           - Prior to the procedure, a History and Physical                            was performed, and patient medications and                            allergies were reviewed. The patient's tolerance of                            previous anesthesia was also reviewed. The risks                            and benefits of the procedure and the sedation                            options and risks were discussed with the patient.                            All questions were answered, and informed consent                            was obtained. Prior Anticoagulants: The patient has                            taken no previous anticoagulant or antiplatelet                            agents. ASA Grade Assessment: II - A patient with                            mild systemic disease. After reviewing the risks                            and benefits, the patient was deemed in                            satisfactory condition to undergo the procedure.  After obtaining informed consent, the colonoscope                            was passed under direct vision. Throughout the                            procedure, the patient's blood pressure, pulse, and                            oxygen saturations were monitored continuously. The                            Colonoscope was introduced through the anus and                            advanced  to the the cecum, identified by                            appendiceal orifice and ileocecal valve. The                            colonoscopy was performed without difficulty. The                            patient tolerated the procedure well. The quality                            of the bowel preparation was excellent. The                            ileocecal valve, appendiceal orifice, and rectum                            were photographed. The bowel preparation used was                            SUPREP via split dose instruction. Scope In: 3:15:42 PM Scope Out: 3:25:07 PM Scope Withdrawal Time: 0 hours 7 minutes 14 seconds  Total Procedure Duration: 0 hours 9 minutes 25 seconds  Findings:                 The perianal and digital rectal examinations were                            normal.                           Two sessile polyps were found in the sigmoid colon.                            The polyps were diminutive in size. These polyps                            were removed with a cold snare. Resection and  retrieval were complete. Verification of patient                            identification for the specimen was done. Estimated                            blood loss was minimal.                           The exam was otherwise without abnormality on                            direct and retroflexion views. Complications:            No immediate complications. Estimated Blood Loss:     Estimated blood loss was minimal. Impression:               - Two diminutive polyps in the sigmoid colon,                            removed with a cold snare. Resected and retrieved.                           - The examination was otherwise normal on direct                            and retroflexion views. Recommendation:           - Patient has a contact number available for                            emergencies. The signs and symptoms of potential                             delayed complications were discussed with the                            patient. Return to normal activities tomorrow.                            Written discharge instructions were provided to the                            patient.                           - Resume previous diet.                           - Continue present medications.                           - Repeat colonoscopy is recommended. The                            colonoscopy date will be determined after pathology  results from today's exam become available for                            review. Gatha Mayer, MD 04/14/2020 3:35:42 PM This report has been signed electronically.

## 2020-04-14 NOTE — Progress Notes (Signed)
Report given to PACU, vss 

## 2020-04-14 NOTE — Telephone Encounter (Signed)
Spoke with pt. Pt given update per Dr Talbert Nan and Rx. Pt agreeable and verbalized understanding.  Encounter closed.

## 2020-04-14 NOTE — Progress Notes (Signed)
Pt's states no medical or surgical changes since previsit or office visit.  [facility]- Vitals 

## 2020-04-16 ENCOUNTER — Telehealth: Payer: Self-pay | Admitting: *Deleted

## 2020-04-16 NOTE — Telephone Encounter (Signed)
  Follow up Call-  Call back number 04/14/2020  Post procedure Call Back phone  # [phone]  Permission to leave phone message Yes  Some recent data might be hidden     No answer at 2nd attempt follow up phone call.  Left message on voicemail.

## 2020-04-16 NOTE — Telephone Encounter (Signed)
Follow up call made. no answer.

## 2020-04-21 ENCOUNTER — Encounter: Payer: Self-pay | Admitting: Internal Medicine

## 2020-08-01 ENCOUNTER — Ambulatory Visit: Payer: 59 | Admitting: Obstetrics and Gynecology

## 2020-08-21 ENCOUNTER — Other Ambulatory Visit: Payer: Self-pay

## 2020-08-21 ENCOUNTER — Encounter (HOSPITAL_COMMUNITY): Payer: Self-pay

## 2020-08-21 ENCOUNTER — Telehealth: Payer: Self-pay

## 2020-08-21 ENCOUNTER — Emergency Department (HOSPITAL_COMMUNITY): Payer: 59

## 2020-08-21 ENCOUNTER — Emergency Department (HOSPITAL_COMMUNITY)
Admission: EM | Admit: 2020-08-21 | Discharge: 2020-08-21 | Disposition: A | Discharge status: Home / Self Care | Payer: 59 | Attending: Emergency Medicine | Admitting: Emergency Medicine

## 2020-08-21 DIAGNOSIS — R0789 Other chest pain: Secondary | ICD-10-CM | POA: Insufficient documentation

## 2020-08-21 DIAGNOSIS — Z7984 Long term (current) use of oral hypoglycemic drugs: Secondary | ICD-10-CM | POA: Insufficient documentation

## 2020-08-21 DIAGNOSIS — Z79899 Other long term (current) drug therapy: Secondary | ICD-10-CM | POA: Insufficient documentation

## 2020-08-21 DIAGNOSIS — E119 Type 2 diabetes mellitus without complications: Secondary | ICD-10-CM | POA: Diagnosis not present

## 2020-08-21 DIAGNOSIS — I10 Essential (primary) hypertension: Secondary | ICD-10-CM | POA: Diagnosis not present

## 2020-08-21 LAB — TROPONIN I (HIGH SENSITIVITY)
Troponin I (High Sensitivity): 8 ng/L (ref <18)
Troponin I (High Sensitivity): 7 ng/L (ref <18)

## 2020-08-21 LAB — CBC
HCT: 37.6 % (ref 36.0–46.0)
Hemoglobin: 13 g/dL (ref 12.0–15.0)
MCH: 29.9 pg (ref 26.0–34.0)
MCHC: 34.6 g/dL (ref 30.0–36.0)
MCV: 86.4 fL (ref 80.0–100.0)
Platelets: 321 10*3/uL (ref 150–400)
RBC: 4.35 MIL/uL (ref 3.87–5.11)
RDW: 12.8 % (ref 11.5–15.5)
WBC: 4.8 10*3/uL (ref 4.0–10.5)
nRBC: 0 % (ref 0.0–0.2)

## 2020-08-21 LAB — BASIC METABOLIC PANEL
Anion gap: 10 (ref 5–15)
BUN: 8 mg/dL (ref 6–20)
CO2: 24 mmol/L (ref 22–32)
Calcium: 9.9 mg/dL (ref 8.9–10.3)
Chloride: 106 mmol/L (ref 98–111)
Creatinine, Ser: 0.93 mg/dL (ref 0.44–1.00)
GFR, Estimated: >60 mL/min (ref >60)
Glucose, Bld: 224 mg/dL — ABNORMAL HIGH (ref 70–99)
Potassium: 4 mmol/L (ref 3.5–5.1)
Sodium: 140 mmol/L (ref 135–145)

## 2020-08-21 MED ORDER — LISINOPRIL 40 MG PO TABS: 40.0000 mg | ORAL_TABLET | Freq: Every day | ORAL | 0 refills | Status: DC

## 2020-08-21 MED ORDER — LISINOPRIL 20 MG PO TABS
20.0000 mg | ORAL_TABLET | Freq: Once | ORAL | Status: AC
  Administered 2020-08-21: 16:00:00 20 mg via ORAL
  Filled 2020-08-21: qty 1

## 2020-08-21 NOTE — Telephone Encounter (Signed)
Agree with ER given CP and elevated BP

## 2020-08-21 NOTE — ED Provider Notes (Signed)
[name] EMERGENCY DEPARTMENT Provider Note   CSN: [id number] Arrival date & time: 08/21/20  9562     History Chief Complaint  Patient presents with  . Chest Pain  . Hypertension    Michelle Castillo is a 54 y.o. female.  HPI She reports that she was getting her regular medical screening exam done at work.  She reports her blood pressure was up to 170s over 90s she reports at that time they recommended she call her family doctor to discuss her blood pressure monitoring and control.  She called her PCP and they had her check it several more times at home.  They asked about chest pain or headache.  Patient reports that she did not have any yesterday when she was talking to them.  She reports they called again this morning to check in on her blood pressures in today when they asked about chest pain she did feel that she had some tightness overnight and she mentioned that.  At that point she was recommended to come to the emergency department.  Patient denies she has any ongoing chest pain.  She reports that she does not feel short of breath.  She does not have swelling in the legs.  No recent fever cough or chills.  Patient reports she occasionally gets headaches and they resolve if she takes an aspirin.  No current headache.  No acute visual changes.  Patient reports she is compliant with her medications.  She is takes lisinopril 20 mg daily.  She reports she used to be on hydrochlorothiazide and seemed to have pretty good control on that but then was changed to lisinopril.  She cannot remember why the hydrochlorothiazide was discontinued.  Patient does not smoke.    Past Medical History:  Diagnosis Date  . ANEMIA, IRON DEFICIENCY 2018  . DIABETES MELLITUS, TYPE II   . Headache(784.0)   . HYPERCHOLESTEROLEMIA   . HYPERTENSION   . Hyperthyroidism    no meds, slightly elevated, MD will recheck in 3 months  . Migraines   . Peripheral focal chorioretinal inflammation of  both eyes   . URINARY INCONTINENCE     Patient Active Problem List   Diagnosis Date Noted  . Hyperlipidemia associated with type 2 diabetes mellitus ([place]) 04/10/2020  . Nuclear sclerotic cataract of both eyes 03/20/2019  . Peripheral focal chorioretinal inflammation of both eyes 03/20/2019  . Retinal edema 03/20/2019  . Anxiety and depression 04/06/2017  . Symptomatic anemia 03/09/2017  . Autoimmune hemolytic anemia ([place]) 03/07/2017  . Leukemoid reaction   . Anemia 03/06/2017  . Status post laparoscopic hysterectomy 02/22/2017  . Hypokalemia 02/14/2017  . Pain in joint, lower leg 03/15/2013  . Encounter for long-term (current) use of other medications 06/23/2012  . HYPERCHOLESTEROLEMIA 07/30/2010  . Iron deficiency anemia 07/30/2010  . HEADACHE 03/19/2008  . Hyperthyroidism 07/10/2007  . MYALGIA 07/10/2007  . Diabetes ([place]) 04/06/2007  . Essential hypertension 04/06/2007  . URINARY INCONTINENCE 04/06/2007    Past Surgical History:  Procedure Laterality Date  . ABDOMINAL HYSTERECTOMY    . APPENDECTOMY    . BARTHOLIN CYST MARSUPIALIZATION Right 02/22/2017   Procedure: BARTHOLIN CYST MARSUPIALIZATION;  Surgeon: Salvadore Dom, MD;  Location: [facility];  Service: Gynecology;  Laterality: Right;  . CESAREAN SECTION  1998  . COLONOSCOPY  last 05/25/2013  . CYSTOSCOPY N/A 02/22/2017   Procedure: CYSTOSCOPY;  Surgeon: Salvadore Dom, MD;  Location: [facility];  Service: Gynecology;  Laterality: N/A;  .  LAPAROSCOPIC LYSIS OF ADHESIONS N/A 02/22/2017   Procedure: EXTENSIVE LAPAROSCOPIC LYSIS OF ADHESIONS;  Surgeon: Salvadore Dom, MD;  Location: [facility];  Service: Gynecology;  Laterality: N/A;  Dr. Kieth Brightly   . OOPHORECTOMY Left 02/22/2017   Procedure: OOPHORECTOMY;  Surgeon: Salvadore Dom, MD;  Location: [facility];  Service: Gynecology;  Laterality: Left;  . TONSILLECTOMY AND ADENOIDECTOMY    . TOTAL LAPAROSCOPIC HYSTERECTOMY WITH SALPINGECTOMY Bilateral 02/22/2017    Procedure: HYSTERECTOMY TOTAL LAPAROSCOPIC WITH SALPINGECTOMY;  Surgeon: Salvadore Dom, MD;  Location: [facility];  Service: Gynecology;  Laterality: Bilateral;     OB History    Gravida  3   Para  2   Term  1   Preterm      AB  1   Living  1     SAB      IAB  1   Ectopic      Multiple      Live Births  1           Family History  Problem Relation Age of Onset  . Diabetes Father   . Colon cancer Father 66       2002  . Hypertension Father   . Stroke Father 80  . Heart disease Father        stints  . Diabetes Mother   . Thyroid disease Mother   . Hypertension Mother   . Breast cancer Maternal Aunt   . Breast cancer Maternal Aunt   . Breast cancer Maternal Aunt   . Breast cancer Maternal Aunt   . Breast cancer Maternal Aunt   . Diabetes Brother   . Thyroid disease Brother   . Breast cancer Maternal Aunt   . Colon polyps Neg Hx   . Esophageal cancer Neg Hx   . Rectal cancer Neg Hx   . Stomach cancer Neg Hx     Social History   Tobacco Use  . Smoking status: Never Smoker  . Smokeless tobacco: Never Used  Vaping Use  . Vaping Use: Never used  Substance Use Topics  . Alcohol use: Yes    Alcohol/week: 2.0 standard drinks    Types: 2 Standard drinks or equivalent per week    Comment: a few drinks a week, weekend  . Drug use: No    Home Medications Prior to Admission medications   Medication Sig Start Date End Date Taking? Authorizing Provider  atorvastatin (LIPITOR) 10 MG tablet Take 1 tablet (10 mg total) by mouth daily. 11/28/19  Yes Lesleigh Noe, MD  azaTHIOprine (IMURAN) 50 MG tablet Take 25 mg by mouth daily.  04/17/19  Yes [provider]  fesoterodine (TOVIAZ) 4 MG TB24 tablet Take 1 tablet (4 mg total) by mouth daily. 11/27/19  Yes Lesleigh Noe, MD  lisinopril (ZESTRIL) 20 MG tablet Take 1 tablet (20 mg total) by mouth daily. 04/10/20  Yes Lesleigh Noe, MD  metFORMIN (GLUCOPHAGE-XR) 500 MG 24 hr tablet TAKE 4 TABLETS  DAILY. Patient taking differently: Take 500 mg by mouth in the morning, at noon, in the evening, and at bedtime. 03/10/20  Yes Renato Shin, MD  Multiple Vitamin (MULTIVITAMIN) tablet Take 1 tablet by mouth daily.   Yes [provider]  prednisoLONE acetate (PRED FORTE) 1 % ophthalmic suspension Place 1 drop into both eyes daily. 01/02/20  Yes [provider]  SUMAtriptan (IMITREX) 100 MG tablet TAKE 1 TABLET AT ONSET-MAY REPEAT IN 2 HOURS AS NEEDED- NO MORE THAN  2 IN 24 HOURS. Patient taking differently: Take 100 mg by mouth every 2 (two) hours as needed for migraine. 11/27/19  Yes Lesleigh Noe, MD  lisinopril (ZESTRIL) 40 MG tablet Take 1 tablet (40 mg total) by mouth daily. 08/21/20   Charlesetta Shanks, MD    Allergies    Patient has no known allergies.  Review of Systems   Review of Systems 10 systems reviewed and negative except as per HPI Physical Exam Updated Vital Signs BP (!) 182/94   Pulse 81   Temp 98.8 F (37.1 C) (Oral)   Resp 17   Ht 5\' 7"  (1.702 m)   Wt 78.9 kg   LMP 12/29/2016   SpO2 100%   BMI 27.25 kg/m   Physical Exam Constitutional:      Appearance: She is well-developed and well-nourished.  HENT:     Head: Normocephalic and atraumatic.  Eyes:     Extraocular Movements: EOM normal.     Pupils: Pupils are equal, round, and reactive to light.  Cardiovascular:     Rate and Rhythm: Normal rate and regular rhythm.     Pulses: Intact distal pulses.     Heart sounds: Normal heart sounds.  Pulmonary:     Effort: Pulmonary effort is normal.     Breath sounds: Normal breath sounds.  Abdominal:     General: Bowel sounds are normal. There is no distension.     Palpations: Abdomen is soft.     Tenderness: There is no abdominal tenderness.  Musculoskeletal:        General: No swelling, tenderness or edema. Normal range of motion.     Cervical back: Neck supple.     Right lower leg: No edema.     Left lower leg: No edema.  Skin:    General:  Skin is warm, dry and intact.  Neurological:     General: No focal deficit present.     Mental Status: She is alert and oriented to person, place, and time.     GCS: GCS eye subscore is 4. GCS verbal subscore is 5. GCS motor subscore is 6.     Coordination: Coordination normal.     Deep Tendon Reflexes: Strength normal.  Psychiatric:        Mood and Affect: Mood and affect normal.     ED Results / Procedures / Treatments   Labs (all labs ordered are listed, but only abnormal results are displayed) Labs Reviewed  BASIC METABOLIC PANEL - Abnormal; Notable for the following components:      Result Value   Glucose, Bld 224 (*)    All other components within normal limits  CBC  TROPONIN I (HIGH SENSITIVITY)  TROPONIN I (HIGH SENSITIVITY)    EKG EKG Interpretation  Date/Time:  Thursday August 21 2020 09:23:34 EST Ventricular Rate:  108 PR Interval:  162 QRS Duration: 78 QT Interval:  336 QTC Calculation: 450 R Axis:   84 Text Interpretation: Sinus tachycardia no STEMI. nonspecific laateral T wave. LVH. no old comparison Confirmed by Charlesetta Shanks [phone]) on 08/21/2020 12:58:10 PM   Radiology DG Chest 2 View  Result Date: 08/21/2020 CLINICAL DATA:  Hypertensive today, chest pressure, hypertension, diabetes mellitus EXAM: CHEST - 2 VIEW COMPARISON:  None FINDINGS: Minimal enlargement of cardiac silhouette. Mediastinal contours and pulmonary vascularity normal. Lungs clear. No pulmonary infiltrate, pleural effusion or pneumothorax. Osseous structures unremarkable IMPRESSION: Minimal enlargement of cardiac silhouette. No acute abnormalities. Electronically Signed   By: Crist Infante.D.  On: 08/21/2020 13:08    Procedures Procedures (including critical care time)  Medications Ordered in ED Medications  lisinopril (ZESTRIL) tablet 20 mg (has no administration in time range)    ED Course  I have reviewed the triage vital signs and the nursing notes.  Pertinent labs &  imaging results that were available during my care of the patient were reviewed by me and considered in my medical decision making (see chart for details).    MDM Rules/Calculators/A&P                          Patient presents as outlined.  She has history of hypertension and diabetes.  Patient is compliant with medications.  Recently blood pressures have been identified to be elevated patient has not been experiencing signs of endorgan damage.  Yesterday evening after several conversations with PCP she did perceive chest pressure.  Troponin is normal.  EKG shows signs of LVH consistent with long-term hypertension but no acute ischemic appearance.  Low suspicion for ACS.  Certainly patient has risk factors and will need close follow-up and monitoring.  At this time will plan to increase lisinopril to 40mg  daily.  Recommend close follow-up with PCP for blood pressure monitoring.    Final Clinical Impression(s) / ED Diagnoses Final diagnoses:  Primary hypertension  Atypical chest pain    Rx / DC Orders ED Discharge Orders         Ordered    lisinopril (ZESTRIL) 40 MG tablet  Daily        08/21/20 1449           Charlesetta Shanks, MD 08/21/20 1450

## 2020-08-21 NOTE — ED Triage Notes (Signed)
Pt reports chest tightness when she woke up this morning along with hypertension since yesterday, SBP 170 yesterday and now SBP 200. Pt denies headache, dizziness or blurred vision at this time. Pt a.o, nad noted. Pt reports compliance with Bp meds.

## 2020-08-21 NOTE — Telephone Encounter (Signed)
I spoke with Michelle Castillo;Michelle Castillo did not go to UC; Michelle Castillo took BP at home last night 173/101; P ? ; Michelle Castillo took BP now and BP is 192/100 P87.  Michelle Castillo said she has not missed taking lisinopril 20 mg daily. Michelle Castillo did not have any chest tightness yesterday but is having chest tightness now. No CP,H/A,dizziness or SOB. Michelle Castillo said she would go to [facility] ED now. Michelle Castillo declined 911 but her son is there and he will take Michelle Castillo to ED. If condition worsens enroute to ED Michelle Castillo will have son pull over and call 911. Sending note to DR Einar Pheasant.

## 2020-08-21 NOTE — Discharge Instructions (Signed)
1.  Increase your lisinopril dose to 40 mg daily.  Continue to monitor your blood pressure and keep a journal of pressure checks 2-3 times a day with a time of day. 2.  Make an appointment to see your doctor for recheck as soon as possible. 3.  Follow instructions for hypertension and dietary modifications. 4.  Return to the emergency department immediately if you develop significant headache, blurred vision, concerning/recurrent/persisting chest pain with shortness of breath or radiation of pain or nausea.

## 2020-08-21 NOTE — Telephone Encounter (Signed)
[facility] - Client TELEPHONE ADVICE RECORD [facility] Patient Name: Michelle Castillo Gender: Female DOB: 1966-08-02 Age: 54 Y 37 M 20 D Return Phone Number: [phone] (Primary) Address: City/State/Zip: [place] [address] Client [facility] - Client Client Site [facility] Physician Waunita Schooner- MD Contact Type Call Who Is Calling Patient / Member / Family / Caregiver Call Type Triage / Clinical Relationship To Patient Self Return Phone Number [phone] (Primary) Chief Complaint Blood Pressure High Reason for Call Symptomatic / Request for [place] states she had bp taken at work, it is very high. bp 174/104. Translation No Nurse Assessment Nurse: Toy Cookey, RN, Stanton Kidney Date/Time Eilene Ghazi Time): 08/20/2020 4:04:00 PM Confirm and document reason for call. If symptomatic, describe symptoms. ---Caller state her BP was 174/104. No symptoms. Does the patient have any new or worsening symptoms? ---Yes Will a triage be completed? ---Yes Related visit to physician within the last 2 weeks? ---No Does the PT have any chronic conditions? (i.e. diabetes, asthma, this includes High risk factors for pregnancy, etc.) ---Yes List chronic conditions. ---HTN, DM, High Cholesterol. Is the patient pregnant or possibly pregnant? (Ask all females between the ages of 45-55) ---No Is this a behavioral health or substance abuse call? ---No Guidelines Guideline Title Affirmed Question Affirmed Notes Nurse Date/Time (Eastern Time) Blood Pressure - High Systolic BP >= 998 OR Diastolic >= 338 Bonnetta Barry 08/20/2020 4:05:11 PM Disp. Time Eilene Ghazi Time) Disposition Final User 08/20/2020 4:12:28 PM SEE PCP WITHIN 3 DAYS Yes Toy Cookey, RN, Nemiah Commander Disagree/Comply Disagree Caller Understands Yes PLEASE NOTE: All timestamps contained within this report are represented as [place]  Standard Time. CONFIDENTIALTY NOTICE: This fax transmission is intended only for the addressee. It contains information that is legally privileged, confidential or otherwise protected from use or disclosure. If you are not the intended recipient, you are strictly prohibited from reviewing, disclosing, copying using or disseminating any of this information or taking any action in reliance on or regarding this information. If you have received this fax in error, please notify us immediately by telephone so that we can arrange for its return to [place]. Phone: [phone], Toll-Free: [phone], Fax: [phone] Page: 2 of 2 Call Id: [id number] PreDisposition [place] Advice Given Per Guideline SEE PCP WITHIN 3 DAYS: * You need to be seen within 2 or 3 days. CALL BACK IF: * Weakness or numbness of the face, arm or leg on one side of the body occurs * Difficulty walking, difficulty talking, or severe headache occurs * Your blood pressure is over 180/110 * You become worse * Chest pain or difficulty breathing occurs Comments User: Dulcy Fanny, RN Date/Time Eilene Ghazi Time): 08/20/2020 4:14:37 PM Called [facility] to see if able to have patient seen before holiday. Office is booked. She stated she did not want to go to UC and will monitor her BP closely and call back with any symptoms and keep her follow up appointment on the 6th. Referrals GO TO FACILITY REFUSED

## 2020-09-04 ENCOUNTER — Other Ambulatory Visit: Payer: Self-pay

## 2020-09-04 ENCOUNTER — Ambulatory Visit (INDEPENDENT_AMBULATORY_CARE_PROVIDER_SITE_OTHER): Payer: 59 | Admitting: Family Medicine

## 2020-09-04 ENCOUNTER — Encounter: Payer: Self-pay | Admitting: Family Medicine

## 2020-09-04 VITALS — BP 162/100 | HR 83 | Temp 97.2°F | Ht 67.0 in | Wt 172.8 lb

## 2020-09-04 DIAGNOSIS — R32 Unspecified urinary incontinence: Secondary | ICD-10-CM | POA: Diagnosis not present

## 2020-09-04 DIAGNOSIS — I10 Essential (primary) hypertension: Secondary | ICD-10-CM | POA: Diagnosis not present

## 2020-09-04 DIAGNOSIS — E1169 Type 2 diabetes mellitus with other specified complication: Secondary | ICD-10-CM | POA: Diagnosis not present

## 2020-09-04 DIAGNOSIS — R9431 Abnormal electrocardiogram [ECG] [EKG]: Secondary | ICD-10-CM | POA: Insufficient documentation

## 2020-09-04 MED ORDER — FESOTERODINE FUMARATE ER 4 MG PO TB24: 4.0000 mg | ORAL_TABLET | Freq: Every day | ORAL | 2 refills | Status: DC

## 2020-09-04 MED ORDER — HYDROCHLOROTHIAZIDE 25 MG PO TABS
25.0000 mg | ORAL_TABLET | Freq: Every day | ORAL | 3 refills | Status: DC
Start: 2020-09-04 — End: 2021-07-28

## 2020-09-04 NOTE — Assessment & Plan Note (Signed)
Notes long hx. Handout and encouraged Kegels. She had improvement with Toviaz but issues with coverage. Failed oxybutin due to dry mouth and limited effect. Will resend Toviaz and see if it is covered.

## 2020-09-04 NOTE — Assessment & Plan Note (Signed)
Follows with endocrinology. Good control on last hgb a1c. Cont metformin. She is going to f/u with endo

## 2020-09-04 NOTE — Assessment & Plan Note (Signed)
Signs of LVH on EKG and with HTN will get Echo to further evaluate.

## 2020-09-04 NOTE — Assessment & Plan Note (Signed)
BP still elevated. Pt notes improved control on HCTZ but this was stopped 2/2 to hypokalemia. Will restart now that she is on lisinopril 40 mg and recheck labs in 2 weeks. HCTZ 25 mg. Consider Maxzide if hypokalemia returns. Mychart with home bp in 2 weeks.

## 2020-09-04 NOTE — Patient Instructions (Addendum)
#  Hypertension - continue lisinopril 40 mg - Restart hydrochlorothiazide 25 mg daily - return in 2 weeks for labs - and send MyChart message around the same time with blood pressure readings      Urinary Incontinence in Women  Risk Factors - Cannot change: age, pregnancy history, history of hysterectomy - Possible factors you can change: overall health, diuretic use, high impact exercise, weight   General Treatment  1) Appropriate Fluid intake - drink 50-70 oz daily, but spread out how the fluid is consumed 2) Constipation Management - make sure you get fiber in your diet and if constipated a stool softener 3) Weight loss (if overweight) 4) Pelvic Floor strengthening --  ---- Kegel Exercises (practice by holding urine during urination -- feel the pelvic floor muscle contraction). Perform 3 sets of 8-10 contractions working your way to holding each contraction for 10 seconds. Do for 15-20 weeks -----> If you have trouble doing this, try them laying down. Lay down on your back with your feet flat on the ground and knees bent.    Urge Incontinence - Cause: detrusor overactivity (over-active bladder) - Symptoms: lose urine on the way to the bathroom, more frequent trips, urinating overnight - Medications/Substances that worsen: alcohol, caffeine, Diuretics - Treatment >>> Kegel Exercises and Bladder training >>> Bladder Training: Remain stationary when urgency occurs, concentrate on decreasing the urge (Do 3 quick Kegels and 2 deep belly breaths), once controlled walk slowly to the bathroom. Eventually try to wait 30-60 minutes when the urge occurs >>> In patients with cognitive impairment: prompted voiding (remind regular use of the bathroom), scheduled voiding (take them to the bathroom every 2-3 hours) >>> Medications: oxybutynin, darifenacin, mirabegron >>> Other: botox and neuromodulation

## 2020-09-04 NOTE — Progress Notes (Signed)
Subjective:     Michelle Castillo is a 55 y.o. female presenting for ER follow-up (HT )     HPI   #HTN - went for biometric screening and was advised  - gets HA occasionally - did have some tightness in chest  - but the chest tightness has improved - has been checking at home 160-190/90-100s - does try to check when she is resting and less active - will check at her office desk  #incontinence - oxybutin ineffective and side effects   Review of Systems  08/21/2020: ER - Lisinopril increased to 40 mg. EKG w/o ST elevation but LVH. Trop negative  Social History   Tobacco Use  Smoking Status Never Smoker  Smokeless Tobacco Never Used        Objective:    BP Readings from Last 3 Encounters:  09/04/20 (!) 162/100  08/21/20 (!) 159/92  04/14/20 (!) 154/89   Wt Readings from Last 3 Encounters:  09/04/20 172 lb 12 oz (78.4 kg)  08/21/20 174 lb (78.9 kg)  04/14/20 171 lb (77.6 kg)    BP (!) 162/100   Pulse 83   Temp (!) 97.2 F (36.2 C) (Temporal)   Ht 5\' 7"  (1.702 m)   Wt 172 lb 12 oz (78.4 kg)   LMP 12/29/2016   SpO2 98%   BMI 27.06 kg/m    Physical Exam Constitutional:      General: She is not in acute distress.    Appearance: She is well-developed. She is not diaphoretic.  HENT:     Right Ear: External ear normal.     Left Ear: External ear normal.     Nose: Nose normal.  Eyes:     Conjunctiva/sclera: Conjunctivae normal.  Cardiovascular:     Rate and Rhythm: Normal rate and regular rhythm.     Heart sounds: No murmur heard.   Pulmonary:     Effort: Pulmonary effort is normal. No respiratory distress.     Breath sounds: Normal breath sounds. No wheezing.  Musculoskeletal:     Cervical back: Neck supple.  Skin:    General: Skin is warm and dry.     Capillary Refill: Capillary refill takes less than 2 seconds.  Neurological:     Mental Status: She is alert. Mental status is at baseline.  Psychiatric:        Mood and Affect: Mood normal.         Behavior: Behavior normal.           Assessment & Plan:   Problem List Items Addressed This Visit      Cardiovascular and Mediastinum   Essential hypertension - Primary    BP still elevated. Pt notes improved control on HCTZ but this was stopped 2/2 to hypokalemia. Will restart now that she is on lisinopril 40 mg and recheck labs in 2 weeks. HCTZ 25 mg. Consider Maxzide if hypokalemia returns. Mychart with home bp in 2 weeks.       Relevant Medications   hydrochlorothiazide (HYDRODIURIL) 25 MG tablet   Other Relevant Orders   Basic metabolic panel     Endocrine   Type 2 diabetes mellitus with other specified complication ([place])    Follows with endocrinology. Good control on last hgb a1c. Cont metformin. She is going to f/u with endo        Other   URINARY INCONTINENCE    Notes long hx. Handout and encouraged Kegels. She had improvement with Lisbeth Ply but issues with  coverage. Failed oxybutin due to dry mouth and limited effect. Will resend Toviaz and see if it is covered.       Relevant Medications   fesoterodine (TOVIAZ) 4 MG TB24 tablet   Abnormal EKG    Signs of LVH on EKG and with HTN will get Echo to further evaluate.       Relevant Orders   ECHOCARDIOGRAM COMPLETE       Return in about 6 weeks (around 10/16/2020).  Lynnda Child, MD  This visit occurred during the SARS-CoV-2 public health emergency.  Safety protocols were in place, including screening questions prior to the visit, additional usage of staff PPE, and extensive cleaning of exam room while observing appropriate contact time as indicated for disinfecting solutions.

## 2020-09-05 NOTE — Addendum Note (Signed)
Addended by: Virl Cagey on: 09/05/2020 04:39 PM   Modules accepted: Orders

## 2020-09-17 ENCOUNTER — Other Ambulatory Visit: Payer: Self-pay | Admitting: *Deleted

## 2020-09-17 DIAGNOSIS — E1169 Type 2 diabetes mellitus with other specified complication: Secondary | ICD-10-CM

## 2020-09-17 DIAGNOSIS — E785 Hyperlipidemia, unspecified: Secondary | ICD-10-CM

## 2020-09-17 MED ORDER — ATORVASTATIN CALCIUM 10 MG PO TABS: 10.0000 mg | ORAL_TABLET | Freq: Every day | ORAL | 2 refills | Status: DC

## 2020-09-21 ENCOUNTER — Encounter: Payer: Self-pay | Admitting: Family Medicine

## 2020-09-21 DIAGNOSIS — I10 Essential (primary) hypertension: Secondary | ICD-10-CM

## 2020-09-22 NOTE — Addendum Note (Signed)
Addended by: Lesleigh Noe on: 09/22/2020 03:16 PM   Modules accepted: Orders

## 2020-09-23 ENCOUNTER — Other Ambulatory Visit: Payer: Self-pay | Admitting: Obstetrics and Gynecology

## 2020-09-23 ENCOUNTER — Other Ambulatory Visit: Payer: Self-pay

## 2020-09-23 ENCOUNTER — Ambulatory Visit (HOSPITAL_COMMUNITY): Payer: 59 | Attending: Internal Medicine

## 2020-09-23 DIAGNOSIS — R9431 Abnormal electrocardiogram [ECG] [EKG]: Secondary | ICD-10-CM | POA: Insufficient documentation

## 2020-09-23 DIAGNOSIS — R5381 Other malaise: Secondary | ICD-10-CM

## 2020-09-23 LAB — ECHOCARDIOGRAM COMPLETE
Area-P 1/2: 2.9 cm2
S' Lateral: 1.9 cm

## 2020-09-25 ENCOUNTER — Other Ambulatory Visit: Payer: Self-pay

## 2020-09-25 ENCOUNTER — Other Ambulatory Visit: Payer: Self-pay | Admitting: Obstetrics and Gynecology

## 2020-09-25 ENCOUNTER — Other Ambulatory Visit (INDEPENDENT_AMBULATORY_CARE_PROVIDER_SITE_OTHER): Payer: 59

## 2020-09-25 DIAGNOSIS — Z1231 Encounter for screening mammogram for malignant neoplasm of breast: Secondary | ICD-10-CM

## 2020-09-25 DIAGNOSIS — I10 Essential (primary) hypertension: Secondary | ICD-10-CM | POA: Diagnosis not present

## 2020-09-25 LAB — BASIC METABOLIC PANEL
BUN: 8 mg/dL (ref 6–23)
CO2: 30 mEq/L (ref 19–32)
Calcium: 9.9 mg/dL (ref 8.4–10.5)
Chloride: 101 mEq/L (ref 96–112)
Creatinine, Ser: 0.88 mg/dL (ref 0.40–1.20)
GFR: 75.57 mL/min (ref >60.00)
Glucose, Bld: 193 mg/dL — ABNORMAL HIGH (ref 70–99)
Potassium: 3.5 mEq/L (ref 3.5–5.1)
Sodium: 137 mEq/L (ref 135–145)

## 2020-09-26 DIAGNOSIS — Z1231 Encounter for screening mammogram for malignant neoplasm of breast: Secondary | ICD-10-CM

## 2020-10-12 ENCOUNTER — Other Ambulatory Visit: Payer: Self-pay

## 2020-10-12 ENCOUNTER — Emergency Department (HOSPITAL_COMMUNITY)
Admission: EM | Admit: 2020-10-12 | Discharge: 2020-10-12 | Disposition: A | Discharge status: Home / Self Care | Payer: 59 | Attending: Emergency Medicine | Admitting: Emergency Medicine

## 2020-10-12 ENCOUNTER — Encounter (HOSPITAL_COMMUNITY): Payer: Self-pay

## 2020-10-12 DIAGNOSIS — R1030 Lower abdominal pain, unspecified: Secondary | ICD-10-CM | POA: Diagnosis present

## 2020-10-12 DIAGNOSIS — Z79899 Other long term (current) drug therapy: Secondary | ICD-10-CM | POA: Insufficient documentation

## 2020-10-12 DIAGNOSIS — E119 Type 2 diabetes mellitus without complications: Secondary | ICD-10-CM | POA: Insufficient documentation

## 2020-10-12 DIAGNOSIS — Z7984 Long term (current) use of oral hypoglycemic drugs: Secondary | ICD-10-CM | POA: Diagnosis not present

## 2020-10-12 DIAGNOSIS — R55 Syncope and collapse: Secondary | ICD-10-CM | POA: Diagnosis not present

## 2020-10-12 DIAGNOSIS — I10 Essential (primary) hypertension: Secondary | ICD-10-CM | POA: Diagnosis not present

## 2020-10-12 DIAGNOSIS — R109 Unspecified abdominal pain: Secondary | ICD-10-CM

## 2020-10-12 LAB — CBC
HCT: 37.4 % (ref 36.0–46.0)
Hemoglobin: 12.2 g/dL (ref 12.0–15.0)
MCH: 28.7 pg (ref 26.0–34.0)
MCHC: 32.6 g/dL (ref 30.0–36.0)
MCV: 88 fL (ref 80.0–100.0)
Platelets: 347 10*3/uL (ref 150–400)
RBC: 4.25 MIL/uL (ref 3.87–5.11)
RDW: 13 % (ref 11.5–15.5)
WBC: 5.7 10*3/uL (ref 4.0–10.5)
nRBC: 0 % (ref 0.0–0.2)

## 2020-10-12 LAB — URINALYSIS, ROUTINE W REFLEX MICROSCOPIC
Bilirubin Urine: NEGATIVE
Glucose, UA: 50 mg/dL — AB
Hgb urine dipstick: NEGATIVE
Ketones, ur: NEGATIVE mg/dL
Leukocytes,Ua: NEGATIVE
Nitrite: NEGATIVE
Protein, ur: NEGATIVE mg/dL
Specific Gravity, Urine: 1.017 (ref 1.005–1.030)
pH: 5 (ref 5.0–8.0)

## 2020-10-12 LAB — COMPREHENSIVE METABOLIC PANEL
ALT: 13 U/L (ref 0–44)
AST: 17 U/L (ref 15–41)
Albumin: 4 g/dL (ref 3.5–5.0)
Alkaline Phosphatase: 56 U/L (ref 38–126)
Anion gap: 10 (ref 5–15)
BUN: 9 mg/dL (ref 6–20)
CO2: 25 mmol/L (ref 22–32)
Calcium: 9.5 mg/dL (ref 8.9–10.3)
Chloride: 101 mmol/L (ref 98–111)
Creatinine, Ser: 0.95 mg/dL (ref 0.44–1.00)
GFR, Estimated: >60 mL/min (ref >60)
Glucose, Bld: 247 mg/dL — ABNORMAL HIGH (ref 70–99)
Potassium: 3.3 mmol/L — ABNORMAL LOW (ref 3.5–5.1)
Sodium: 136 mmol/L (ref 135–145)
Total Bilirubin: 0.6 mg/dL (ref 0.3–1.2)
Total Protein: 6.6 g/dL (ref 6.5–8.1)

## 2020-10-12 LAB — LIPASE, BLOOD: Lipase: 22 U/L (ref 11–51)

## 2020-10-12 MED ORDER — ONDANSETRON HCL 4 MG PO TABS: 4.0000 mg | ORAL_TABLET | Freq: Three times a day (TID) | ORAL | 0 refills | Status: DC | PRN

## 2020-10-12 NOTE — ED Triage Notes (Signed)
Patient arrived by RR RN after having acute lower abdominal pain with nausea. Patient states that it started after having a sausage biscuit this am. After arriving back to her work space had another event with acute pain and syncopal event. Reports pain improved

## 2020-10-12 NOTE — ED Provider Notes (Signed)
[facility] EMERGENCY DEPARTMENT Provider Note   CSN: [id number] Arrival date & time: 10/12/20  1309     History No chief complaint on file.   Michelle Castillo is a 55 y.o. female.  HPI 55 year old female history of type 2 diabetes, hypertension, hypercholesterolemia, status post appendectomy and hysterectomy presents today complaining of crampy lower abdominal pain 3 times a day with a near syncopal episode.  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  She had had a sausage biscuit earlier.  She began having some crampy low abdominal pain and went to the restroom.  Pain became severe.  It eased off and she returned to her office but then returned.  She had a near syncopal episode with this.  Pain has since resolved.  She has not had similar pain in the past.  It was in the lower abdomen and not in the upper abdomen.  She has not had fever, chills, or vomiting.  She has been taking her prescription medications as prescribed.    Past Medical History:  Diagnosis Date  . ANEMIA, IRON DEFICIENCY 2018  . DIABETES MELLITUS, TYPE II   . Headache(784.0)   . HYPERCHOLESTEROLEMIA   . HYPERTENSION   . Hyperthyroidism    no meds, slightly elevated, MD will recheck in 3 months  . Migraines   . Peripheral focal chorioretinal inflammation of both eyes   . URINARY INCONTINENCE     Patient Active Problem List   Diagnosis Date Noted  . Abnormal EKG 09/04/2020  . Hyperlipidemia associated with type 2 diabetes mellitus ([address]) 04/10/2020  . Nuclear sclerotic cataract of both eyes 03/20/2019  . Peripheral focal chorioretinal inflammation of both eyes 03/20/2019  . Retinal edema 03/20/2019  . Anxiety and depression 04/06/2017  . Symptomatic anemia 03/09/2017  . Autoimmune hemolytic anemia ([place]) 03/07/2017  . Leukemoid reaction   . Anemia 03/06/2017  . Status post laparoscopic hysterectomy 02/22/2017  . Hypokalemia 02/14/2017  . Pain in joint, lower leg 03/15/2013   . Encounter for long-term (current) use of other medications 06/23/2012  . HYPERCHOLESTEROLEMIA 07/30/2010  . Iron deficiency anemia 07/30/2010  . HEADACHE 03/19/2008  . Hyperthyroidism 07/10/2007  . MYALGIA 07/10/2007  . Type 2 diabetes mellitus with other specified complication ([place]) 96/22/2979  . Essential hypertension 04/06/2007  . URINARY INCONTINENCE 04/06/2007    Past Surgical History:  Procedure Laterality Date  . ABDOMINAL HYSTERECTOMY    . APPENDECTOMY    . BARTHOLIN CYST MARSUPIALIZATION Right 02/22/2017   Procedure: BARTHOLIN CYST MARSUPIALIZATION;  Surgeon: Salvadore Dom, MD;  Location: [facility];  Service: Gynecology;  Laterality: Right;  . CESAREAN SECTION  1998  . COLONOSCOPY  last 05/25/2013  . CYSTOSCOPY N/A 02/22/2017   Procedure: CYSTOSCOPY;  Surgeon: Salvadore Dom, MD;  Location: [facility];  Service: Gynecology;  Laterality: N/A;  . LAPAROSCOPIC LYSIS OF ADHESIONS N/A 02/22/2017   Procedure: EXTENSIVE LAPAROSCOPIC LYSIS OF ADHESIONS;  Surgeon: Salvadore Dom, MD;  Location: [facility];  Service: Gynecology;  Laterality: N/A;  Dr. Kieth Brightly   . OOPHORECTOMY Left 02/22/2017   Procedure: OOPHORECTOMY;  Surgeon: Salvadore Dom, MD;  Location: [facility];  Service: Gynecology;  Laterality: Left;  . TONSILLECTOMY AND ADENOIDECTOMY    . TOTAL LAPAROSCOPIC HYSTERECTOMY WITH SALPINGECTOMY Bilateral 02/22/2017   Procedure: HYSTERECTOMY TOTAL LAPAROSCOPIC WITH SALPINGECTOMY;  Surgeon: Salvadore Dom, MD;  Location: [facility];  Service: Gynecology;  Laterality: Bilateral;     OB History    [place]  3   Para  2   Term  1   Preterm      AB  1   Living  1     SAB      IAB  1   Ectopic      Multiple      Live Births  1           Family History  Problem Relation Age of Onset  . Diabetes Father   . Colon cancer Father 61       2002  . Hypertension Father   . Stroke Father 26  . Heart disease Father        stints  . Diabetes Mother    . Thyroid disease Mother   . Hypertension Mother   . Breast cancer Maternal Aunt   . Breast cancer Maternal Aunt   . Breast cancer Maternal Aunt   . Breast cancer Maternal Aunt   . Breast cancer Maternal Aunt   . Diabetes Brother   . Thyroid disease Brother   . Breast cancer Maternal Aunt   . Colon polyps Neg Hx   . Esophageal cancer Neg Hx   . Rectal cancer Neg Hx   . Stomach cancer Neg Hx     Social History   Tobacco Use  . Smoking status: Never Smoker  . Smokeless tobacco: Never Used  Vaping Use  . Vaping Use: Never used  Substance Use Topics  . Alcohol use: Yes    Alcohol/week: 2.0 standard drinks    Types: 2 Standard drinks or equivalent per week    Comment: a few drinks a week, weekend  . Drug use: No    Home Medications Prior to Admission medications   Medication Sig Start Date End Date Taking? Authorizing Provider  atorvastatin (LIPITOR) 10 MG tablet Take 1 tablet (10 mg total) by mouth daily. 09/17/20   Lesleigh Noe, MD  azaTHIOprine (IMURAN) 50 MG tablet Take 25 mg by mouth daily.  04/17/19   [provider]  fesoterodine (TOVIAZ) 4 MG TB24 tablet Take 1 tablet (4 mg total) by mouth daily. 09/04/20   Lesleigh Noe, MD  hydrochlorothiazide (HYDRODIURIL) 25 MG tablet Take 1 tablet (25 mg total) by mouth daily. 09/04/20   Lesleigh Noe, MD  lisinopril (ZESTRIL) 40 MG tablet Take 1 tablet (40 mg total) by mouth daily. 08/21/20   Charlesetta Shanks, MD  metFORMIN (GLUCOPHAGE-XR) 500 MG 24 hr tablet TAKE 4 TABLETS DAILY. Patient taking differently: Take 500 mg by mouth in the morning, at noon, in the evening, and at bedtime. 03/10/20   Renato Shin, MD  Multiple Vitamin (MULTIVITAMIN) tablet Take 1 tablet by mouth daily.    [provider]  prednisoLONE acetate (PRED FORTE) 1 % ophthalmic suspension Place 1 drop into both eyes daily. 01/02/20   [provider]  SUMAtriptan (IMITREX) 100 MG tablet TAKE 1 TABLET AT ONSET-MAY REPEAT IN 2 HOURS AS  NEEDED- NO MORE THAN 2 IN 24 HOURS. Patient taking differently: Take 100 mg by mouth every 2 (two) hours as needed for migraine. 11/27/19   Lesleigh Noe, MD    Allergies    Patient has no known allergies.  Review of Systems   Review of Systems  All other systems reviewed and are negative.   Physical Exam Updated Vital Signs BP (!) 148/82   Pulse 81   Temp 98.1 F (36.7 C) (Oral)   Resp 13   LMP 12/29/2016   SpO2  100%   Physical Exam Vitals and nursing note reviewed.  Constitutional:      Appearance: Normal appearance.  HENT:     Head: Normocephalic.     Right Ear: External ear normal.     Left Ear: External ear normal.     Nose: Nose normal.     Mouth/Throat:     Mouth: Mucous membranes are moist.  Eyes:     Extraocular Movements: Extraocular movements intact.     Pupils: Pupils are equal, round, and reactive to light.  Cardiovascular:     Rate and Rhythm: Normal rate and regular rhythm.     Pulses: Normal pulses.  Pulmonary:     Effort: Pulmonary effort is normal.     Breath sounds: Normal breath sounds.  Abdominal:     General: Abdomen is flat. Bowel sounds are normal.     Palpations: Abdomen is soft.     Tenderness: There is no abdominal tenderness.  Musculoskeletal:        General: Normal range of motion.     Cervical back: Normal range of motion.  Skin:    General: Skin is warm and dry.     Capillary Refill: Capillary refill takes less than 2 seconds.  Neurological:     General: No focal deficit present.     Mental Status: She is alert.  Psychiatric:        Mood and Affect: Mood normal.        Behavior: Behavior normal.     ED Results / Procedures / Treatments   Labs (all labs ordered are listed, but only abnormal results are displayed) Labs Reviewed  COMPREHENSIVE METABOLIC PANEL - Abnormal; Notable for the following components:      Result Value   Potassium 3.3 (*)    Glucose, Bld 247 (*)    All other components within normal limits   LIPASE, BLOOD  CBC  URINALYSIS, ROUTINE W REFLEX MICROSCOPIC    EKG EKG Interpretation  Date/Time:  Sunday October 12 2020 13:30:12 EST Ventricular Rate:  90 PR Interval:  186 QRS Duration: 86 QT Interval:  356 QTC Calculation: 435 R Axis:   54 Text Interpretation: Normal sinus rhythm Nonspecific T wave abnormality Abnormal ECG Confirmed by Myleen Brailsford ([id number]) on 10/12/2020 2:37:05 PM   Radiology No results found.  Procedures Procedures   Medications Ordered in ED Medications - No data to display  ED Course  I have reviewed the triage vital signs and the nursing notes.  Pertinent labs & imaging results that were available during my care of the patient were reviewed by me and considered in my medical decision making (see chart for details).    MDM Rules/Calculators/A&P                          54  year old female presents today with crampy lower abdominal pain with associated near syncope that appears to have been vasovagal in nature.  On her exam here her labs are essentially normal with the exception of hyperglycemia consistent with her type 2 diabetes and physiological stress. Abdomen is soft and nontender and do not think imaging is indicated Plan urinalysis given pain is in lower abdomen although not lateralized and no uti symptoms. Care discussed with Dr. Ronnald Nian who will reevaluate patient after urinalysis results Final Clinical Impression(s) / ED Diagnoses Final diagnoses:  Abdominal cramping  Vasovagal near syncope    Rx / DC Orders ED Discharge Orders    None  Pattricia Boss, MD 10/12/20 [phone]

## 2020-10-12 NOTE — Discharge Instructions (Addendum)
Please drink plenty of fluids Recheck with your doctor this week

## 2020-10-12 NOTE — ED Provider Notes (Addendum)
Patient signed out to me awaiting urinalysis.  This was negative.  She was having some crampy abdominal pain and feeling like she is in a pass out from that.  Never passed out.  Work-up overall unremarkable.  May be some gas related pain.  Will prescribe Zofran.  No chest pain.  No abdominal pain.  Doubt appendicitis or bowel obstruction.  No concern for hepatobiliary disease such as cholecystitis as well.  Recommend follow-up with primary care doctor.  Discharged from ED in good condition.  This chart was dictated using voice recognition software.  Despite best efforts to proofread,  errors can occur which can change the documentation meaning.     Lennice Sites, DO 10/12/20 [place], [place], DO 10/12/20 1751

## 2020-10-12 NOTE — ED Notes (Signed)
Provided pt w/diet gingerale

## 2020-10-16 ENCOUNTER — Other Ambulatory Visit: Payer: Self-pay

## 2020-10-16 ENCOUNTER — Ambulatory Visit
Admission: RE | Admit: 2020-10-16 | Discharge: 2020-10-16 | Disposition: A | Discharge status: Home / Self Care | Payer: 59 | Source: Ambulatory Visit

## 2020-10-16 DIAGNOSIS — Z1231 Encounter for screening mammogram for malignant neoplasm of breast: Secondary | ICD-10-CM

## 2020-11-20 ENCOUNTER — Encounter: Payer: Self-pay | Admitting: Family Medicine

## 2020-11-20 ENCOUNTER — Ambulatory Visit: Payer: 59 | Admitting: Family Medicine

## 2020-11-20 ENCOUNTER — Other Ambulatory Visit: Payer: Self-pay

## 2020-11-20 VITALS — BP 130/80 | HR 88 | Temp 97.6°F | Ht 67.0 in | Wt 175.2 lb

## 2020-11-20 DIAGNOSIS — E876 Hypokalemia: Secondary | ICD-10-CM | POA: Diagnosis not present

## 2020-11-20 DIAGNOSIS — R32 Unspecified urinary incontinence: Secondary | ICD-10-CM

## 2020-11-20 DIAGNOSIS — I10 Essential (primary) hypertension: Secondary | ICD-10-CM

## 2020-11-20 LAB — BASIC METABOLIC PANEL
BUN: 11 mg/dL (ref 6–23)
CO2: 31 mEq/L (ref 19–32)
Calcium: 9.6 mg/dL (ref 8.4–10.5)
Chloride: 103 mEq/L (ref 96–112)
Creatinine, Ser: 0.99 mg/dL (ref 0.40–1.20)
GFR: 65.54 mL/min (ref >60.00)
Glucose, Bld: 181 mg/dL — ABNORMAL HIGH (ref 70–99)
Potassium: 3.7 mEq/L (ref 3.5–5.1)
Sodium: 140 mEq/L (ref 135–145)

## 2020-11-20 MED ORDER — LISINOPRIL 40 MG PO TABS: 40.0000 mg | ORAL_TABLET | Freq: Every day | ORAL | 3 refills | Status: DC

## 2020-11-20 NOTE — Assessment & Plan Note (Signed)
Noted during ER visit. Issue in the past for patient who once needed potassium supplement. Discussed if still low would switch HCTZ to maxzide and recheck potassium.

## 2020-11-20 NOTE — Patient Instructions (Signed)
Blood pressure looks great!  Continue Lisinopril 40 mg  Get your labs today  If potassium is good, will continue HCTZ 25 mg  If low, will make switch in medication

## 2020-11-20 NOTE — Progress Notes (Signed)
Subjective:     Michelle Castillo is a 55 y.o. female presenting for Follow-up (BP)     HPI  #HTN - reports taking lisinopril 20 mg and hctz 25 mg - 2 weeks ago cut down her lisinopril dose - no cp, dizziness   #Urinary incontinence - was able to fill the [place] - improvement with this  Did have nausea and abdominal and went to the    Review of Systems  09/04/2020: Clinic - HTN - restarted hctz 25 mg with lisinopril 40 mg. Urinary incontinence - Toviaz - K normal 1/27 but low 2/13 - improved control via mychart  Social History   Tobacco Use  Smoking Status Never Smoker  Smokeless Tobacco Never Used        Objective:    BP Readings from Last 3 Encounters:  11/20/20 130/80  10/12/20 (!) 142/87  09/04/20 (!) 162/100   Wt Readings from Last 3 Encounters:  11/20/20 175 lb 4 oz (79.5 kg)  09/04/20 172 lb 12 oz (78.4 kg)  08/21/20 174 lb (78.9 kg)    BP 130/80   Pulse 88   Temp 97.6 F (36.4 C) (Temporal)   Ht 5\' 7"  (1.702 m)   Wt 175 lb 4 oz (79.5 kg)   LMP 12/29/2016   SpO2 98%   BMI 27.45 kg/m    Physical Exam Constitutional:      General: She is not in acute distress.    Appearance: She is well-developed. She is not diaphoretic.  HENT:     Right Ear: External ear normal.     Left Ear: External ear normal.  Eyes:     Conjunctiva/sclera: Conjunctivae normal.  Cardiovascular:     Rate and Rhythm: Normal rate and regular rhythm.     Heart sounds: No murmur heard.   Pulmonary:     Effort: Pulmonary effort is normal. No respiratory distress.     Breath sounds: Normal breath sounds. No wheezing.  Musculoskeletal:     Cervical back: Neck supple.  Skin:    General: Skin is warm and dry.     Capillary Refill: Capillary refill takes less than 2 seconds.  Neurological:     Mental Status: She is alert. Mental status is at baseline.  Psychiatric:        Mood and Affect: Mood normal.        Behavior: Behavior normal.           Assessment &  Plan:   Problem List Items Addressed This Visit      Cardiovascular and Mediastinum   Essential hypertension - Primary    BP well controlled on lisinopril and hctz. Will continue lisinopril 40 mg and HCTZ 25 mg pending recheck of potassium.       Relevant Medications   lisinopril (ZESTRIL) 40 MG tablet   Other Relevant Orders   Basic metabolic panel     Other   URINARY INCONTINENCE    Improved. Cont toviaz 4 mg      Hypokalemia    Noted during ER visit. Issue in the past for patient who once needed potassium supplement. Discussed if still low would switch HCTZ to maxzide and recheck potassium.       Relevant Orders   Basic metabolic panel       Return in about 1 year (around 11/20/2021).  Lesleigh Noe, MD  This visit occurred during the SARS-CoV-2 public health emergency.  Safety protocols were in place, including screening questions prior to  the visit, additional usage of staff PPE, and extensive cleaning of exam room while observing appropriate contact time as indicated for disinfecting solutions.

## 2020-11-20 NOTE — Assessment & Plan Note (Signed)
Improved. Cont toviaz 4 mg

## 2020-11-20 NOTE — Assessment & Plan Note (Signed)
BP well controlled on lisinopril and hctz. Will continue lisinopril 40 mg and HCTZ 25 mg pending recheck of potassium.

## 2021-03-06 ENCOUNTER — Ambulatory Visit (INDEPENDENT_AMBULATORY_CARE_PROVIDER_SITE_OTHER): Payer: 59 | Admitting: Endocrinology

## 2021-03-06 ENCOUNTER — Other Ambulatory Visit: Payer: Self-pay

## 2021-03-06 VITALS — BP 142/84 | HR 100 | Ht 67.0 in | Wt 172.0 lb

## 2021-03-06 DIAGNOSIS — E1169 Type 2 diabetes mellitus with other specified complication: Secondary | ICD-10-CM

## 2021-03-06 DIAGNOSIS — E119 Type 2 diabetes mellitus without complications: Secondary | ICD-10-CM

## 2021-03-06 LAB — POCT GLYCOSYLATED HEMOGLOBIN (HGB A1C): Hemoglobin A1C: 7.2 % — AB (ref 4.0–5.6)

## 2021-03-06 MED ORDER — METFORMIN HCL ER 500 MG PO TB24: 2000.0000 mg | ORAL_TABLET | Freq: Every day | ORAL | 3 refills | Status: DC

## 2021-03-06 MED ORDER — DAPAGLIFLOZIN PROPANEDIOL 5 MG PO TABS: 5.0000 mg | ORAL_TABLET | Freq: Every day | ORAL | 3 refills | Status: DC

## 2021-03-06 NOTE — Progress Notes (Signed)
Subjective:    Patient ID: Michelle Castillo, female    DOB: March 28, 1966, 55 y.o.   MRN: [id number]  HPI Pt returns for f/u of diabetes mellitus:  DM type: 2 Dx'ed: 6144 Complications: none Therapy: metformin.  DKA: never Severe hypoglycemia: never Pancreatitis: never Pancreatic imaging: normal on 2018 CT.  Other: she has never been on insulin; she did not tolerate Rybelsus (nausea).   Interval history: she takes metformin as rx'ed.  pt states she feels well in general.   Past Medical History:  Diagnosis Date   ANEMIA, IRON DEFICIENCY 2018   DIABETES MELLITUS, TYPE II    Headache(784.0)    HYPERCHOLESTEROLEMIA    HYPERTENSION    Hyperthyroidism    no meds, slightly elevated, MD will recheck in 3 months   Migraines    Peripheral focal chorioretinal inflammation of both eyes    URINARY INCONTINENCE     Past Surgical History:  Procedure Laterality Date   ABDOMINAL HYSTERECTOMY     APPENDECTOMY     BARTHOLIN CYST MARSUPIALIZATION Right 02/22/2017   Procedure: BARTHOLIN CYST MARSUPIALIZATION;  Surgeon: Salvadore Dom, MD;  Location: [facility];  Service: Gynecology;  Laterality: Right;   [place]   COLONOSCOPY  last 05/25/2013   CYSTOSCOPY N/A 02/22/2017   Procedure: CYSTOSCOPY;  Surgeon: Salvadore Dom, MD;  Location: [facility];  Service: Gynecology;  Laterality: N/A;   LAPAROSCOPIC LYSIS OF ADHESIONS N/A 02/22/2017   Procedure: EXTENSIVE LAPAROSCOPIC LYSIS OF ADHESIONS;  Surgeon: Salvadore Dom, MD;  Location: [facility];  Service: Gynecology;  Laterality: N/A;  Dr. Kieth Brightly    OOPHORECTOMY Left 02/22/2017   Procedure: OOPHORECTOMY;  Surgeon: Salvadore Dom, MD;  Location: [facility];  Service: Gynecology;  Laterality: Left;   TONSILLECTOMY AND ADENOIDECTOMY     TOTAL LAPAROSCOPIC HYSTERECTOMY WITH SALPINGECTOMY Bilateral 02/22/2017   Procedure: HYSTERECTOMY TOTAL LAPAROSCOPIC WITH SALPINGECTOMY;  Surgeon: Salvadore Dom, MD;  Location: [facility];   Service: Gynecology;  Laterality: Bilateral;    Social History   Socioeconomic History   Marital status: Divorced    Spouse name: Not on file   Number of children: 1   Years of education: masters   Highest education level: Not on file  Occupational History   Occupation: Development worker, international aid  Tobacco Use   Smoking status: Never   Smokeless tobacco: Never  Vaping Use   Vaping Use: Never used  Substance and Sexual Activity   Alcohol use: Yes    Alcohol/week: 2.0 standard drinks    Types: 2 Standard drinks or equivalent per week    Comment: a few drinks a week, weekend   Drug use: No   Sexual activity: Yes    Partners: Male    Birth control/protection: Surgical, Condom    Comment: hysterctomy  Other Topics Concern   Not on file  Social History Narrative   11/27/19   From: has been in [place]   Living: with son - [name]   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[place], 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      Family: [name] - 1998, good relationship with parents   SO: Boyfriend - [name] - since 2020      Enjoys: walking with a friend, vacationing      Exercise: walking with friend   Diet: diabetic diet - tries to conscientious about diet      Safety   Seat belts: Yes    Guns: No   Safe  in relationships: Yes          Social Determinants of Radio broadcast assistant Strain: Not on file  Food Insecurity: Not on file  Transportation Needs: Not on file  Physical Activity: Not on file  Stress: Not on file  Social Connections: Not on file  Intimate Partner Violence: Not on file    Current Outpatient Medications on File Prior to Visit  Medication Sig Dispense Refill   atorvastatin (LIPITOR) 10 MG tablet Take 1 tablet (10 mg total) by mouth daily. 90 tablet 2   azaTHIOprine (IMURAN) 50 MG tablet Take 50 mg by mouth daily.     dorzolamide-timolol (COSOPT) 22.3-6.8 MG/ML ophthalmic solution Place 1 drop into both eyes 2 (two) times daily.     fesoterodine  (TOVIAZ) 4 MG TB24 tablet Take 1 tablet (4 mg total) by mouth daily. 90 tablet 2   hydrochlorothiazide (HYDRODIURIL) 25 MG tablet Take 1 tablet (25 mg total) by mouth daily. 90 tablet 3   lisinopril (ZESTRIL) 40 MG tablet Take 1 tablet (40 mg total) by mouth daily. 90 tablet 3   Multiple Vitamin (MULTIVITAMIN) tablet Take 1 tablet by mouth daily.     ondansetron (ZOFRAN) 4 MG tablet Take 1 tablet (4 mg total) by mouth every 8 (eight) hours as needed for up to 15 doses for nausea or vomiting. 15 tablet 0   SUMAtriptan (IMITREX) 100 MG tablet TAKE 1 TABLET AT ONSET-MAY REPEAT IN 2 HOURS AS NEEDED- NO MORE THAN 2 IN 24 HOURS. (Patient taking differently: Take 100 mg by mouth every 2 (two) hours as needed for migraine.) 10 tablet 1   No current facility-administered medications on file prior to visit.    No Known Allergies  Family History  Problem Relation Age of Onset   Diabetes Father    Colon cancer Father 60       2002   Hypertension Father    Stroke Father 49   Heart disease Father        stints   Diabetes Mother    Thyroid disease Mother    Hypertension Mother    Breast cancer Maternal Aunt    Breast cancer Maternal Aunt    Breast cancer Maternal Aunt    Breast cancer Maternal Aunt    Breast cancer Maternal Aunt    Diabetes Brother    Thyroid disease Brother    Breast cancer Maternal Aunt    Colon polyps Neg Hx    Esophageal cancer Neg Hx    Rectal cancer Neg Hx    Stomach cancer Neg Hx     BP (!) 142/84   Pulse 100   Ht 5\' 7"  (1.702 m)   Wt 172 lb (78 kg)   LMP 12/29/2016   SpO2 99%   BMI 26.94 kg/m    Review of Systems     Objective:   Physical Exam Pulses: dorsalis pedis intact bilat.   MSK: no deformity of the feet CV: no leg edema Skin:  no ulcer on the feet.  normal color and temp on the feet.  Neuro: sensation is intact to touch on the feet.    Lab Results  Component Value Date   HGBA1C 7.2 (A) 03/06/2021   Lab Results  Component Value Date    CREATININE 0.99 11/20/2020   BUN 11 11/20/2020   NA 140 11/20/2020   K 3.7 11/20/2020   CL 103 11/20/2020   CO2 31 11/20/2020       Assessment &  Plan:  Type 2 DM: uncontrolled  Patient Instructions  I have sent a prescription to your pharmacy, to add "Farxiga."   Please continue the same metformin.   check your blood sugar once a day.  vary the time of day when you check, between before the 3 meals, and at bedtime.  also check if you have symptoms of your blood sugar being too high or too low.  please keep a record of the readings and bring it to your next appointment here (or you can bring the meter itself).  You can write it on any piece of paper.  please call us sooner if your blood sugar goes below 70, or if you have a lot of readings over 200. Please come back for a follow-up appointment in 3-4 months

## 2021-03-06 NOTE — Patient Instructions (Signed)
I have sent a prescription to your pharmacy, to add "Michelle Castillo."   Please continue the same metformin.   check your blood sugar once a day.  vary the time of day when you check, between before the 3 meals, and at bedtime.  also check if you have symptoms of your blood sugar being too high or too low.  please keep a record of the readings and bring it to your next appointment here (or you can bring the meter itself).  You can write it on any piece of paper.  please call us sooner if your blood sugar goes below 70, or if you have a lot of readings over 200. Please come back for a follow-up appointment in 3-4 months

## 2021-03-09 ENCOUNTER — Telehealth: Payer: Self-pay | Admitting: Pharmacy Technician

## 2021-03-09 NOTE — Telephone Encounter (Addendum)
.  Patient Advocate Encounter   Received notification from [facility] that prior authorization for Wilder Glade is required.   PA submitted on 03/09/2021 Key [id number] Status is denied.  Plan wants patient to try Jardiance first, it is on their formulary.  Provider informed.    [facility] will continue to follow.   Venida Jarvis. Nadara Mustard, CPhT Patient Advocate [facility] Phone: [phone] Fax:  [phone]

## 2021-03-10 ENCOUNTER — Other Ambulatory Visit: Payer: Self-pay | Admitting: Endocrinology

## 2021-03-10 MED ORDER — EMPAGLIFLOZIN 10 MG PO TABS: 10.0000 mg | ORAL_TABLET | Freq: Every day | ORAL | 3 refills | Status: DC

## 2021-05-12 ENCOUNTER — Other Ambulatory Visit: Payer: Self-pay | Admitting: Family Medicine

## 2021-05-12 DIAGNOSIS — E785 Hyperlipidemia, unspecified: Secondary | ICD-10-CM

## 2021-05-12 DIAGNOSIS — E1169 Type 2 diabetes mellitus with other specified complication: Secondary | ICD-10-CM

## 2021-06-05 ENCOUNTER — Ambulatory Visit (INDEPENDENT_AMBULATORY_CARE_PROVIDER_SITE_OTHER): Payer: 59 | Admitting: Endocrinology

## 2021-06-05 ENCOUNTER — Encounter: Payer: Self-pay | Admitting: Endocrinology

## 2021-06-05 ENCOUNTER — Other Ambulatory Visit: Payer: Self-pay

## 2021-06-05 VITALS — BP 112/74 | HR 92 | Ht 67.0 in | Wt 170.2 lb

## 2021-06-05 DIAGNOSIS — E119 Type 2 diabetes mellitus without complications: Secondary | ICD-10-CM | POA: Diagnosis not present

## 2021-06-05 LAB — POCT GLYCOSYLATED HEMOGLOBIN (HGB A1C): Hemoglobin A1C: 7.9 % — AB (ref 4.0–5.6)

## 2021-06-05 MED ORDER — SITAGLIPTIN PHOSPHATE 100 MG PO TABS: 100.0000 mg | ORAL_TABLET | Freq: Every day | ORAL | 3 refills | Status: DC

## 2021-06-05 NOTE — Patient Instructions (Addendum)
I have sent a prescription to your pharmacy, to add "Januvia."   Please continue the same other 2 diabetes meds.  check your blood sugar once a day.  vary the time of day when you check, between before the 3 meals, and at bedtime.  also check if you have symptoms of your blood sugar being too high or too low.  please keep a record of the readings and bring it to your next appointment here (or you can bring the meter itself).  You can write it on any piece of paper.  please call us sooner if your blood sugar goes below 70, or if you have a lot of readings over 200.   Please come back for a follow-up appointment in 2 months.

## 2021-06-05 NOTE — Progress Notes (Signed)
Subjective:    Patient ID: Michelle Castillo, female    DOB: 03-Jun-1966, 55 y.o.   MRN: [id number]  HPI Pt returns for f/u of diabetes mellitus:  DM type: 2 Dx'ed: 4174 Complications: none Therapy: 2 oral meds DKA: never Severe hypoglycemia: never Pancreatitis: never Pancreatic imaging: normal on 2018 CT.  Other: she has never been on insulin; she did not tolerate Rybelsus (nausea).   Interval history: she takes metformin as rx'ed.  pt states she feels well in general.  She has not recently checked cbg.   Past Medical History:  Diagnosis Date   ANEMIA, IRON DEFICIENCY 2018   DIABETES MELLITUS, TYPE II    Headache(784.0)    HYPERCHOLESTEROLEMIA    HYPERTENSION    Hyperthyroidism    no meds, slightly elevated, MD will recheck in 3 months   Migraines    Peripheral focal chorioretinal inflammation of both eyes    URINARY INCONTINENCE     Past Surgical History:  Procedure Laterality Date   ABDOMINAL HYSTERECTOMY     APPENDECTOMY     BARTHOLIN CYST MARSUPIALIZATION Right 02/22/2017   Procedure: BARTHOLIN CYST MARSUPIALIZATION;  Surgeon: Salvadore Dom, MD;  Location: [facility];  Service: Gynecology;  Laterality: Right;   [place]   COLONOSCOPY  last 05/25/2013   CYSTOSCOPY N/A 02/22/2017   Procedure: CYSTOSCOPY;  Surgeon: Salvadore Dom, MD;  Location: [facility];  Service: Gynecology;  Laterality: N/A;   LAPAROSCOPIC LYSIS OF ADHESIONS N/A 02/22/2017   Procedure: EXTENSIVE LAPAROSCOPIC LYSIS OF ADHESIONS;  Surgeon: Salvadore Dom, MD;  Location: [facility];  Service: Gynecology;  Laterality: N/A;  Dr. Kieth Brightly    OOPHORECTOMY Left 02/22/2017   Procedure: OOPHORECTOMY;  Surgeon: Salvadore Dom, MD;  Location: [facility];  Service: Gynecology;  Laterality: Left;   TONSILLECTOMY AND ADENOIDECTOMY     TOTAL LAPAROSCOPIC HYSTERECTOMY WITH SALPINGECTOMY Bilateral 02/22/2017   Procedure: HYSTERECTOMY TOTAL LAPAROSCOPIC WITH SALPINGECTOMY;  Surgeon: Salvadore Dom, MD;  Location: [facility];  Service: Gynecology;  Laterality: Bilateral;    Social History   Socioeconomic History   Marital status: Divorced    Spouse name: Not on file   Number of children: 1   Years of education: masters   Highest education level: Not on file  Occupational History   Occupation: Development worker, international aid  Tobacco Use   Smoking status: Never   Smokeless tobacco: Never  Vaping Use   Vaping Use: Never used  Substance and Sexual Activity   Alcohol use: Yes    Alcohol/week: 2.0 standard drinks    Types: 2 Standard drinks or equivalent per week    Comment: a few drinks a week, weekend   Drug use: No   Sexual activity: Yes    Partners: Male    Birth control/protection: Surgical, Condom    Comment: hysterctomy  Other Topics Concern   Not on file  Social History Narrative   11/27/19   From[place] has been in [place]   Living: with son - Michelle Castillo   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[place], 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      Family: Michelle Castillo - 1998, good relationship with parents   SO: Boyfriend - Michelle Castillo - since 2020      Enjoys: walking with a friend, vacationing      Exercise: walking with friend   Diet: diabetic diet - tries to conscientious about diet      Safety   Seat belts: Yes  Guns: No   Safe in relationships: Yes          Social Determinants of Radio broadcast assistant Strain: Not on file  Food Insecurity: Not on file  Transportation Needs: Not on file  Physical Activity: Not on file  Stress: Not on file  Social Connections: Not on file  Intimate Partner Violence: Not on file    Current Outpatient Medications on File Prior to Visit  Medication Sig Dispense Refill   atorvastatin (LIPITOR) 10 MG tablet TAKE 1 TABLET BY MOUTH  DAILY 90 tablet 3   azaTHIOprine (IMURAN) 50 MG tablet Take 50 mg by mouth daily.     dorzolamide-timolol (COSOPT) 22.3-6.8 MG/ML ophthalmic solution Place 1 drop into both eyes 2 (two) times daily.      empagliflozin (JARDIANCE) 10 MG TABS tablet Take 1 tablet (10 mg total) by mouth daily before breakfast. 90 tablet 3   fesoterodine (TOVIAZ) 4 MG TB24 tablet Take 1 tablet (4 mg total) by mouth daily. 90 tablet 2   hydrochlorothiazide (HYDRODIURIL) 25 MG tablet Take 1 tablet (25 mg total) by mouth daily. 90 tablet 3   lisinopril (ZESTRIL) 40 MG tablet Take 1 tablet (40 mg total) by mouth daily. 90 tablet 3   metFORMIN (GLUCOPHAGE-XR) 500 MG 24 hr tablet Take 4 tablets (2,000 mg total) by mouth daily. 360 tablet 3   Multiple Vitamin (MULTIVITAMIN) tablet Take 1 tablet by mouth daily.     ondansetron (ZOFRAN) 4 MG tablet Take 1 tablet (4 mg total) by mouth every 8 (eight) hours as needed for up to 15 doses for nausea or vomiting. 15 tablet 0   SUMAtriptan (IMITREX) 100 MG tablet TAKE 1 TABLET AT ONSET-MAY REPEAT IN 2 HOURS AS NEEDED- NO MORE THAN 2 IN 24 HOURS. (Patient taking differently: Take 100 mg by mouth every 2 (two) hours as needed for migraine.) 10 tablet 1   No current facility-administered medications on file prior to visit.    No Known Allergies  Family History  Problem Relation Age of Onset   Diabetes Father    Colon cancer Father 60       2002   Hypertension Father    Stroke Father 61   Heart disease Father        stints   Diabetes Mother    Thyroid disease Mother    Hypertension Mother    Breast cancer Maternal Aunt    Breast cancer Maternal Aunt    Breast cancer Maternal Aunt    Breast cancer Maternal Aunt    Breast cancer Maternal Aunt    Diabetes Brother    Thyroid disease Brother    Breast cancer Maternal Aunt    Colon polyps Neg Hx    Esophageal cancer Neg Hx    Rectal cancer Neg Hx    Stomach cancer Neg Hx     BP 112/74 (BP Location: Left Arm, Patient Position: Sitting, Cuff Size: Small)   Pulse 92   Ht 5\' 7"  (1.702 m)   Wt 170 lb 3.2 oz (77.2 kg)   LMP 12/29/2016   SpO2 99%   BMI 26.66 kg/m    Review of Systems     Objective:   Physical  Exam Pulses: dorsalis pedis intact bilat.   MSK: no deformity of the feet CV: no leg edema.   Skin:  no ulcer on the feet.  normal color and temp on the feet.  Neuro: sensation is intact to touch on the feet.  A1c=7.9%  Lab Results  Component Value Date   CREATININE 0.99 11/20/2020   BUN 11 11/20/2020   NA 140 11/20/2020   K 3.7 11/20/2020   CL 103 11/20/2020   CO2 31 11/20/2020      Assessment & Plan:  Type 2 DM: uncontrolled.   Patient Instructions  I have sent a prescription to your pharmacy, to add "Januvia."   Please continue the same other 2 diabetes meds.  check your blood sugar once a day.  vary the time of day when you check, between before the 3 meals, and at bedtime.  also check if you have symptoms of your blood sugar being too high or too low.  please keep a record of the readings and bring it to your next appointment here (or you can bring the meter itself).  You can write it on any piece of paper.  please call us sooner if your blood sugar goes below 70, or if you have a lot of readings over 200.   Please come back for a follow-up appointment in 2 months.

## 2021-06-08 ENCOUNTER — Telehealth: Payer: Self-pay | Admitting: Pharmacy Technician

## 2021-06-08 NOTE — Telephone Encounter (Signed)
Patient Advocate Encounter   Received notification from [place] [facility] that prior authorization for [facility] 100MG  is required.   PA submitted on 06/08/21 Key [id number] Status is pending    [facility] will continue to follow   Burney Gauze CPhT Patient [facility] Phone: [phone] Fax:  [phone]

## 2021-06-10 ENCOUNTER — Other Ambulatory Visit: Payer: Self-pay | Admitting: Endocrinology

## 2021-06-10 MED ORDER — LINAGLIPTIN 5 MG PO TABS: 5.0000 mg | ORAL_TABLET | Freq: Every day | ORAL | 3 refills | Status: DC

## 2021-06-10 NOTE — Telephone Encounter (Signed)
Message was sent thru [place]

## 2021-06-10 NOTE — Telephone Encounter (Signed)
Received a fax regarding Prior Authorization from [place] [facility] for [place]. Authorization has been DENIED because CRITERIA FOR JANUVIA NOT MET:  Medical records documenting that you have had an inadequate response, failed, or cannot use all of the following for a three month trial (date and duration of trial must be provided): (1) [place]. (2) Onglyza. (3) Tradjenta

## 2021-06-24 NOTE — Progress Notes (Signed)
55 y.o. G70P1011 Divorced Black or [place] American Not Hispanic or Latino female here for annual exam.   She c/o frequent vaginal/vulvar irritation. Not currently. She uses monistat and it helps.  No dryness.  No hot flashes or night sweats.   Sexually active, same partner for a couple of years. Wants vaginal testing for STD's.   H/O TLH/RS/LSO/LOA in 6/18. Developed hemolytic anemia post operatively.   Normal bowel or bladder c/o.    H/O DM HgbA1C 7.9 last month. Started on a new medication. Trying to change her diet.   Patient's last menstrual period was 12/29/2016.          Sexually active: Yes.    The current method of family planning is status post hysterectomy.    Exercising: Walking.  Smoker:  no  Health Maintenance: Pap:  07-01-16 Normal Neg HR HPV History of abnormal Pap:  no MMG:  10-16-20 normal BMD:   N/A Colonoscopy: 2021 sessile polyps, f/u 5 years TDaP:  2016 Gardasil: N/A   reports that she has never smoked. She has never used smokeless tobacco. She reports current alcohol use of about 2.0 standard drinks per week. She reports that she does not use drugs. Works as an Scientist, research (physical sciences). Son is 52, working, living at home.  Past Medical History:  Diagnosis Date   ANEMIA, IRON DEFICIENCY 2018   DIABETES MELLITUS, TYPE II    Headache(784.0)    HYPERCHOLESTEROLEMIA    HYPERTENSION    Hyperthyroidism    no meds, slightly elevated, MD will recheck in 3 months   Migraines    Peripheral focal chorioretinal inflammation of both eyes    URINARY INCONTINENCE     Past Surgical History:  Procedure Laterality Date   ABDOMINAL HYSTERECTOMY     APPENDECTOMY     BARTHOLIN CYST MARSUPIALIZATION Right 02/22/2017   Procedure: BARTHOLIN CYST MARSUPIALIZATION;  Surgeon: Salvadore Dom, MD;  Location: [facility];  Service: Gynecology;  Laterality: Right;   [place]   COLONOSCOPY  last 05/25/2013   CYSTOSCOPY N/A 02/22/2017   Procedure:  CYSTOSCOPY;  Surgeon: Salvadore Dom, MD;  Location: [facility];  Service: Gynecology;  Laterality: N/A;   LAPAROSCOPIC LYSIS OF ADHESIONS N/A 02/22/2017   Procedure: EXTENSIVE LAPAROSCOPIC LYSIS OF ADHESIONS;  Surgeon: Salvadore Dom, MD;  Location: [facility];  Service: Gynecology;  Laterality: N/A;  Dr. Kieth Brightly    OOPHORECTOMY Left 02/22/2017   Procedure: OOPHORECTOMY;  Surgeon: Salvadore Dom, MD;  Location: [facility];  Service: Gynecology;  Laterality: Left;   TONSILLECTOMY AND ADENOIDECTOMY     TOTAL LAPAROSCOPIC HYSTERECTOMY WITH SALPINGECTOMY Bilateral 02/22/2017   Procedure: HYSTERECTOMY TOTAL LAPAROSCOPIC WITH SALPINGECTOMY;  Surgeon: Salvadore Dom, MD;  Location: [facility];  Service: Gynecology;  Laterality: Bilateral;    Current Outpatient Medications  Medication Sig Dispense Refill   atorvastatin (LIPITOR) 10 MG tablet TAKE 1 TABLET BY MOUTH  DAILY 90 tablet 3   azaTHIOprine (IMURAN) 50 MG tablet Take 50 mg by mouth daily.     dorzolamide-timolol (COSOPT) 22.3-6.8 MG/ML ophthalmic solution Place 1 drop into both eyes 2 (two) times daily.     empagliflozin (JARDIANCE) 10 MG TABS tablet Take 1 tablet (10 mg total) by mouth daily before breakfast. 90 tablet 3   hydrochlorothiazide (HYDRODIURIL) 25 MG tablet Take 1 tablet (25 mg total) by mouth daily. 90 tablet 3   linagliptin (TRADJENTA) 5 MG TABS tablet Take 1 tablet (5 mg total) by mouth daily. [place]  tablet 3   lisinopril (ZESTRIL) 40 MG tablet Take 1 tablet (40 mg total) by mouth daily. 90 tablet 3   metFORMIN (GLUCOPHAGE-XR) 500 MG 24 hr tablet Take 4 tablets (2,000 mg total) by mouth daily. 360 tablet 3   Multiple Vitamin (MULTIVITAMIN) tablet Take 1 tablet by mouth daily.     SUMAtriptan (IMITREX) 100 MG tablet TAKE 1 TABLET AT ONSET-MAY REPEAT IN 2 HOURS AS NEEDED- NO MORE THAN 2 IN 24 HOURS. (Patient taking differently: Take 100 mg by mouth every 2 (two) hours as needed for migraine.) 10 tablet 1   fesoterodine  (TOVIAZ) 4 MG TB24 tablet Take 1 tablet (4 mg total) by mouth daily. (Patient not taking: Reported on 07/03/2021) 90 tablet 2   ondansetron (ZOFRAN) 4 MG tablet Take 1 tablet (4 mg total) by mouth every 8 (eight) hours as needed for up to 15 doses for nausea or vomiting. (Patient not taking: Reported on 07/03/2021) 15 tablet 0   No current facility-administered medications for this visit.    Family History  Problem Relation Age of Onset   Diabetes Mother    Thyroid disease Mother    Hypertension Mother    Diabetes Father    Colon cancer Father 60       2002   Hypertension Father    Stroke Father 30   Heart disease Father        stints   Diabetes Brother    Thyroid disease Brother    Breast cancer Maternal Aunt    Breast cancer Maternal Aunt    Breast cancer Maternal Aunt    Breast cancer Maternal Aunt    Breast cancer Maternal Aunt    Breast cancer Maternal Aunt    Breast cancer Cousin    Colon polyps Neg Hx    Esophageal cancer Neg Hx    Rectal cancer Neg Hx    Stomach cancer Neg Hx   Multiple cousins with breast cancer. Aunt with breast cancer gene, mom won't get tested.   Review of Systems  Exam:   BP 128/88 (BP Location: Right Arm, Patient Position: Sitting, Cuff Size: Normal)   Pulse (!) 104   Ht 5' 6.5" (1.689 m)   Wt 168 lb (76.2 kg)   LMP 12/29/2016   SpO2 100%   BMI 26.71 kg/m   Weight change: @WEIGHTCHANGE @ Height:   Height: 5' 6.5" (168.9 cm)  Ht Readings from Last 3 Encounters:  07/03/21 5' 6.5" (1.689 m)  06/05/21 5\' 7"  (1.702 m)  03/06/21 5\' 7"  (1.702 m)    General appearance: alert, cooperative and appears stated age Head: Normocephalic, without obvious abnormality, atraumatic Neck: no adenopathy, supple, symmetrical, trachea midline and thyroid normal to inspection and palpation Lungs: clear to auscultation bilaterally Cardiovascular: regular rate and rhythm Breasts: normal appearance, no masses or tenderness Abdomen: soft, non-tender; non  distended,  no masses,  no organomegaly Extremities: extremities normal, atraumatic, no cyanosis or edema Skin: Skin color, texture, turgor normal. No rashes or lesions Lymph nodes: Cervical, supraclavicular, and axillary nodes normal. No abnormal inguinal nodes palpated Neurologic: Grossly normal   Pelvic: External genitalia:  no lesions              Urethra:  normal appearing urethra with no masses, tenderness or lesions              Bartholins and Skenes: normal                 Vagina: normal appearing vagina with  normal color and discharge, no lesions              Cervix: absent               Bimanual Exam:  Uterus:  uterus absent              Adnexa: no mass, fullness, tenderness               Rectovaginal: Confirms               Anus:  normal sphincter tone, no lesions  Caryn Bee, CMA chaperoned for the exam.  1. Well woman exam Discussed breast self exam Discussed calcium and vit D intake Mammogram in 2/23 Colonoscopy UTD  2. Screening examination for STD (sexually transmitted disease) Declines blood work - SURESWAB CT/NG/T. vaginalis  3. Family history of colon cancer Getting colonoscopies q 5 years, up to date  22. Family history of breast cancer - Ambulatory referral to Genetics  5. Vulvar irritation - WET PREP FOR TRICH, YEAST, CLUE: negative -Vulvar skin care information given -Return with symptoms

## 2021-07-03 ENCOUNTER — Telehealth: Payer: Self-pay | Admitting: Licensed Clinical Social Worker

## 2021-07-03 ENCOUNTER — Ambulatory Visit (INDEPENDENT_AMBULATORY_CARE_PROVIDER_SITE_OTHER): Payer: 59 | Admitting: Obstetrics and Gynecology

## 2021-07-03 ENCOUNTER — Other Ambulatory Visit: Payer: Self-pay

## 2021-07-03 ENCOUNTER — Encounter: Payer: Self-pay | Admitting: Obstetrics and Gynecology

## 2021-07-03 VITALS — BP 128/88 | HR 104 | Ht 66.5 in | Wt 168.0 lb

## 2021-07-03 DIAGNOSIS — Z113 Encounter for screening for infections with a predominantly sexual mode of transmission: Secondary | ICD-10-CM

## 2021-07-03 DIAGNOSIS — Z803 Family history of malignant neoplasm of breast: Secondary | ICD-10-CM

## 2021-07-03 DIAGNOSIS — Z800 Family history of malignant neoplasm of digestive organs: Secondary | ICD-10-CM | POA: Diagnosis not present

## 2021-07-03 DIAGNOSIS — Z01419 Encounter for gynecological examination (general) (routine) without abnormal findings: Secondary | ICD-10-CM

## 2021-07-03 DIAGNOSIS — N9089 Other specified noninflammatory disorders of vulva and perineum: Secondary | ICD-10-CM

## 2021-07-03 LAB — WET PREP FOR TRICH, YEAST, CLUE

## 2021-07-03 NOTE — Telephone Encounter (Signed)
Scheduled appt per 11/4 referral. Pt is aware of appt date and time.  

## 2021-07-03 NOTE — Progress Notes (Signed)
See Dr Jertson's note 

## 2021-07-03 NOTE — Patient Instructions (Signed)

## 2021-07-06 LAB — SURESWAB CT/NG/T. VAGINALIS
C. trachomatis RNA, TMA: NOT DETECTED
N. gonorrhoeae RNA, TMA: NOT DETECTED
Trichomonas vaginalis RNA: NOT DETECTED

## 2021-07-24 ENCOUNTER — Other Ambulatory Visit: Payer: Self-pay | Admitting: Family Medicine

## 2021-07-24 DIAGNOSIS — I10 Essential (primary) hypertension: Secondary | ICD-10-CM

## 2021-07-28 NOTE — Telephone Encounter (Signed)
Please schedule patient for follow up with Dr Einar Pheasant in March 2023

## 2021-07-28 NOTE — Telephone Encounter (Signed)
Lvm for pt to call office to schedule a f/u in march 2023

## 2021-07-29 NOTE — Telephone Encounter (Signed)
2nd attempt  LMTCB to schedule appt in 3/23

## 2021-08-05 ENCOUNTER — Ambulatory Visit: Payer: 59 | Admitting: Endocrinology

## 2021-08-10 ENCOUNTER — Inpatient Hospital Stay: Payer: 59 | Admitting: Licensed Clinical Social Worker

## 2021-08-10 ENCOUNTER — Inpatient Hospital Stay: Payer: 59

## 2021-09-04 ENCOUNTER — Ambulatory Visit: Payer: 59 | Admitting: Endocrinology

## 2021-09-04 ENCOUNTER — Other Ambulatory Visit: Payer: Self-pay

## 2021-09-04 ENCOUNTER — Encounter: Payer: Self-pay | Admitting: Endocrinology

## 2021-09-04 VITALS — BP 136/98 | HR 86 | Ht 66.5 in | Wt 166.2 lb

## 2021-09-04 DIAGNOSIS — E119 Type 2 diabetes mellitus without complications: Secondary | ICD-10-CM | POA: Diagnosis not present

## 2021-09-04 DIAGNOSIS — E1169 Type 2 diabetes mellitus with other specified complication: Secondary | ICD-10-CM | POA: Diagnosis not present

## 2021-09-04 LAB — POCT GLYCOSYLATED HEMOGLOBIN (HGB A1C): Hemoglobin A1C: 6.8 % — AB (ref 4.0–5.6)

## 2021-09-04 MED ORDER — METFORMIN HCL ER 500 MG PO TB24: 2000.0000 mg | ORAL_TABLET | Freq: Every day | ORAL | 3 refills | Status: DC

## 2021-09-04 NOTE — Patient Instructions (Addendum)
Please continue the same 3 diabetes meds.  check your blood sugar once a day.  vary the time of day when you check, between before the 3 meals, and at bedtime.  also check if you have symptoms of your blood sugar being too high or too low.  please keep a record of the readings and bring it to your next appointment here (or you can bring the meter itself).  You can write it on any piece of paper.  please call us sooner if your blood sugar goes below 70, or if you have a lot of readings over 200.   Please come back for a follow-up appointment in 4 months.

## 2021-09-04 NOTE — Progress Notes (Signed)
Subjective:    Patient ID: Michelle Castillo, female    DOB: 06/04/1966, 56 y.o.   MRN: [id number]  HPI Pt returns for f/u of diabetes mellitus:  DM type: 2 Dx'ed: 4158 Complications: none.  Therapy: 3 oral meds DKA: never Severe hypoglycemia: never Pancreatitis: never Pancreatic imaging: normal on 2018 CT.  Other: she has never been on insulin; she did not tolerate Rybelsus (nausea).   Interval history: she takes meds as rx'ed.  pt states she feels well in general.   Past Medical History:  Diagnosis Date   ANEMIA, IRON DEFICIENCY 2018   DIABETES MELLITUS, TYPE II    Headache(784.0)    HYPERCHOLESTEROLEMIA    HYPERTENSION    Hyperthyroidism    no meds, slightly elevated, MD will recheck in 3 months   Migraines    Peripheral focal chorioretinal inflammation of both eyes    URINARY INCONTINENCE     Past Surgical History:  Procedure Laterality Date   ABDOMINAL HYSTERECTOMY     APPENDECTOMY     BARTHOLIN CYST MARSUPIALIZATION Right 02/22/2017   Procedure: BARTHOLIN CYST MARSUPIALIZATION;  Surgeon: Salvadore Dom, MD;  Location: [facility];  Service: Gynecology;  Laterality: Right;   [place]   COLONOSCOPY  last 05/25/2013   CYSTOSCOPY N/A 02/22/2017   Procedure: CYSTOSCOPY;  Surgeon: Salvadore Dom, MD;  Location: [facility];  Service: Gynecology;  Laterality: N/A;   LAPAROSCOPIC LYSIS OF ADHESIONS N/A 02/22/2017   Procedure: EXTENSIVE LAPAROSCOPIC LYSIS OF ADHESIONS;  Surgeon: Salvadore Dom, MD;  Location: [facility];  Service: Gynecology;  Laterality: N/A;  Dr. Kieth Brightly    OOPHORECTOMY Left 02/22/2017   Procedure: OOPHORECTOMY;  Surgeon: Salvadore Dom, MD;  Location: [facility];  Service: Gynecology;  Laterality: Left;   TONSILLECTOMY AND ADENOIDECTOMY     TOTAL LAPAROSCOPIC HYSTERECTOMY WITH SALPINGECTOMY Bilateral 02/22/2017   Procedure: HYSTERECTOMY TOTAL LAPAROSCOPIC WITH SALPINGECTOMY;  Surgeon: Salvadore Dom, MD;  Location: [facility];  Service:  Gynecology;  Laterality: Bilateral;    Social History   Socioeconomic History   Marital status: Divorced    Spouse name: Not on file   Number of children: 1   Years of education: masters   Highest education level: Not on file  Occupational History   Occupation: Development worker, international aid  Tobacco Use   Smoking status: Never   Smokeless tobacco: Never  Vaping Use   Vaping Use: Never used  Substance and Sexual Activity   Alcohol use: Yes    Alcohol/week: 2.0 standard drinks    Types: 2 Standard drinks or equivalent per week    Comment: a few drinks a week, weekend   Drug use: No   Sexual activity: Yes    Partners: Male    Birth control/protection: Surgical, Condom    Comment: hysterctomy  Other Topics Concern   Not on file  Social History Narrative   11/27/19   From[place] has been in [place]   Living: with son - [name]   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      Family: [name] - 1998, good relationship with parents   SO: Boyfriend - [name] - since 2020      Enjoys: walking with a friend, vacationing      Exercise: walking with friend   Diet: diabetic diet - tries to conscientious about diet      Safety   Seat belts: Yes    Guns: No  Safe in relationships: Yes          Social Determinants of Radio broadcast assistant Strain: Not on file  Food Insecurity: Not on file  Transportation Needs: Not on file  Physical Activity: Not on file  Stress: Not on file  Social Connections: Not on file  Intimate Partner Violence: Not on file    Current Outpatient Medications on File Prior to Visit  Medication Sig Dispense Refill   atorvastatin (LIPITOR) 10 MG tablet TAKE 1 TABLET BY MOUTH  DAILY 90 tablet 3   azaTHIOprine (IMURAN) 50 MG tablet Take 50 mg by mouth daily.     dorzolamide-timolol (COSOPT) 22.3-6.8 MG/ML ophthalmic solution Place 1 drop into both eyes 2 (two) times daily.     empagliflozin (JARDIANCE) 10 MG TABS  tablet Take 1 tablet (10 mg total) by mouth daily before breakfast. 90 tablet 3   fesoterodine (TOVIAZ) 4 MG TB24 tablet Take 1 tablet (4 mg total) by mouth daily. 90 tablet 2   hydrochlorothiazide (HYDRODIURIL) 25 MG tablet TAKE 1 TABLET BY MOUTH  DAILY 90 tablet 1   linagliptin (TRADJENTA) 5 MG TABS tablet Take 1 tablet (5 mg total) by mouth daily. 90 tablet 3   lisinopril (ZESTRIL) 40 MG tablet Take 1 tablet (40 mg total) by mouth daily. 90 tablet 3   Multiple Vitamin (MULTIVITAMIN) tablet Take 1 tablet by mouth daily.     ondansetron (ZOFRAN) 4 MG tablet Take 1 tablet (4 mg total) by mouth every 8 (eight) hours as needed for up to 15 doses for nausea or vomiting. 15 tablet 0   SUMAtriptan (IMITREX) 100 MG tablet TAKE 1 TABLET AT ONSET-MAY REPEAT IN 2 HOURS AS NEEDED- NO MORE THAN 2 IN 24 HOURS. (Patient taking differently: Take 100 mg by mouth every 2 (two) hours as needed for migraine.) 10 tablet 1   No current facility-administered medications on file prior to visit.    No Known Allergies  Family History  Problem Relation Age of Onset   Diabetes Mother    Thyroid disease Mother    Hypertension Mother    Diabetes Father    Colon cancer Father 60       2002   Hypertension Father    Stroke Father 27   Heart disease Father        stints   Diabetes Brother    Thyroid disease Brother    Breast cancer Maternal Aunt    Breast cancer Maternal Aunt    Breast cancer Maternal Aunt    Breast cancer Maternal Aunt    Breast cancer Maternal Aunt    Breast cancer Maternal Aunt    Breast cancer Cousin    Colon polyps Neg Hx    Esophageal cancer Neg Hx    Rectal cancer Neg Hx    Stomach cancer Neg Hx     BP (!) 136/98 (BP Location: Left Arm, Patient Position: Sitting, Cuff Size: Normal)    Pulse 86    Ht 5' 6.5" (1.689 m)    Wt 166 lb 3.2 oz (75.4 kg)    LMP 12/29/2016    SpO2 98%    BMI 26.42 kg/m    Review of Systems Denies nausea    Objective:   Physical Exam   Lab Results   Component Value Date   HGBA1C 6.8 (A) 09/04/2021       Assessment & Plan:  Type 2 DM: well-controlled  Patient Instructions  Please continue the same 3 diabetes meds.  check your blood sugar once a day.  vary the time of day when you check, between before the 3 meals, and at bedtime.  also check if you have symptoms of your blood sugar being too high or too low.  please keep a record of the readings and bring it to your next appointment here (or you can bring the meter itself).  You can write it on any piece of paper.  please call us sooner if your blood sugar goes below 70, or if you have a lot of readings over 200.   Please come back for a follow-up appointment in 4 months.

## 2021-09-18 ENCOUNTER — Other Ambulatory Visit: Payer: Self-pay | Admitting: Obstetrics and Gynecology

## 2021-09-18 DIAGNOSIS — Z1231 Encounter for screening mammogram for malignant neoplasm of breast: Secondary | ICD-10-CM

## 2021-10-19 ENCOUNTER — Ambulatory Visit
Admission: RE | Admit: 2021-10-19 | Discharge: 2021-10-19 | Disposition: A | Discharge status: Home / Self Care | Payer: 59 | Source: Ambulatory Visit | Attending: Obstetrics and Gynecology | Admitting: Obstetrics and Gynecology

## 2021-10-19 DIAGNOSIS — Z1231 Encounter for screening mammogram for malignant neoplasm of breast: Secondary | ICD-10-CM

## 2021-11-26 ENCOUNTER — Encounter: Payer: Self-pay | Admitting: Family Medicine

## 2021-11-26 ENCOUNTER — Ambulatory Visit: Payer: 59 | Admitting: Family Medicine

## 2021-11-26 VITALS — BP 126/82 | HR 86 | Temp 98.3°F | Ht 66.5 in | Wt 164.8 lb

## 2021-11-26 DIAGNOSIS — N3941 Urge incontinence: Secondary | ICD-10-CM

## 2021-11-26 DIAGNOSIS — J302 Other seasonal allergic rhinitis: Secondary | ICD-10-CM

## 2021-11-26 MED ORDER — SOLIFENACIN SUCCINATE 5 MG PO TABS: 5.0000 mg | ORAL_TABLET | Freq: Every day | ORAL | 0 refills | Status: DC

## 2021-11-26 MED ORDER — FLUTICASONE PROPIONATE 50 MCG/ACT NA SUSP: 2.0000 | Freq: Every day | NASAL | 6 refills | Status: DC

## 2021-11-26 NOTE — Assessment & Plan Note (Addendum)
Cont cetirizine. Start flonase. Avoid allergens. If no improvement update and may try azelastine spray. If still no improvement briefly discussed option of Singulair.  ?

## 2021-11-26 NOTE — Assessment & Plan Note (Addendum)
She has symptom control on Toviaz but this is $80/month and too expensive. Side effects with oxybutynin. Will try vesicare 5 mg. Referral to urology if side effects/ineffective.  ?

## 2021-11-26 NOTE — Patient Instructions (Signed)
Start Flonase ?Continue allergy medication ? ?Update if no improvement in 2 weeks ? ?

## 2021-11-26 NOTE — Progress Notes (Addendum)
? ?Subjective:  ? ?  ?Michelle Castillo is a 56 y.o. female presenting for Allergic Rhinitis  ( Running nose , watery eye , flare up more over last months, taking otc medication, and meds that she got online, no coughing, no fever , no chills, no body aches ) ?  ? ? ?HPI ? ?#Seasonal allergies ?- has tried claritin, zyrtec and zyrtec-D, allegra ?- took something through [place] twice daily and was doing well (cetrizine)   ?- this season seems to be worse ?- severe runny nose  ?- 2 bad days - Sunday and 2 weeks ago had 1-2 days where were bad ?- nose  will run so much get sores ?- no nose bleeds ?- the bad days - does not know a specific trigger ?- one day had a sneezing/runny nose - when she left work ?- not associated with more outside activity ? ?Has not tried flonase or nasocort ? ? ? ?Review of Systems  ?Constitutional:  Negative for chills and fever.  ?HENT:  Positive for rhinorrhea and sneezing.   ?Eyes:  Positive for discharge.  ?Respiratory:  Negative for cough.   ?Musculoskeletal:  Negative for arthralgias and myalgias.  ? ? ?Social History  ? ?Tobacco Use  ?Smoking Status Never  ?Smokeless Tobacco Never  ? ? ? ?   ?Objective:  ?  ?BP Readings from Last 3 Encounters:  ?11/26/21 126/82  ?09/04/21 (!) 136/98  ?07/03/21 128/88  ? ?Wt Readings from Last 3 Encounters:  ?11/26/21 164 lb 12.8 oz (74.8 kg)  ?09/04/21 166 lb 3.2 oz (75.4 kg)  ?07/03/21 168 lb (76.2 kg)  ? ? ?BP 126/82   Pulse 86   Temp 98.3 ?F (36.8 ?C)   Ht 5' 6.5" (1.689 m)   Wt 164 lb 12.8 oz (74.8 kg)   LMP 12/29/2016   SpO2 100%   BMI 26.20 kg/m?  ? ? ?Physical Exam ?Constitutional:   ?   General: She is not in acute distress. ?   Appearance: She is well-developed. She is not diaphoretic.  ?HENT:  ?   Head: Normocephalic and atraumatic.  ?   Right Ear: Tympanic membrane and external ear normal.  ?   Left Ear: Tympanic membrane and external ear normal.  ?   Nose: Rhinorrhea present.  ?   Mouth/Throat:  ?   Mouth: Mucous membranes are moist.   ?   Pharynx: No posterior oropharyngeal erythema.  ?Eyes:  ?   Conjunctiva/sclera: Conjunctivae normal.  ?Cardiovascular:  ?   Rate and Rhythm: Normal rate and regular rhythm.  ?   Heart sounds: No murmur heard. ?Pulmonary:  ?   Effort: Pulmonary effort is normal. No respiratory distress.  ?   Breath sounds: Normal breath sounds. No wheezing.  ?Musculoskeletal:  ?   Cervical back: Neck supple.  ?Skin: ?   General: Skin is warm and dry.  ?   Capillary Refill: Capillary refill takes less than 2 seconds.  ?Neurological:  ?   Mental Status: She is alert. Mental status is at baseline.  ?Psychiatric:     ?   Mood and Affect: Mood normal.     ?   Behavior: Behavior normal.  ? ? ? ? ? ?   ?Assessment & Plan:  ? ?Problem List Items Addressed This Visit   ? ?  ? Respiratory  ? Seasonal allergic rhinitis - Primary  ?  Cont cetirizine. Start flonase. Avoid allergens. If no improvement update and may try azelastine spray. If  still no improvement briefly discussed option of Singulair.  ?  ?  ? Relevant Medications  ? fluticasone (FLONASE) 50 MCG/ACT nasal spray  ?  ? Other  ? URINARY INCONTINENCE  ?  She has symptom control on Toviaz but this is $80/month and too expensive. Side effects with oxybutynin. Will try vesicare 5 mg. Referral to urology if side effects/ineffective.  ?  ?  ? Relevant Medications  ? solifenacin (VESICARE) 5 MG tablet  ? Other Relevant Orders  ? Ambulatory referral to Urology  ? ? ? ?Return if symptoms worsen or fail to improve. ? ?Lesleigh Noe, MD ? ?This visit occurred during the SARS-CoV-2 public health emergency.  Safety protocols were in place, including screening questions prior to the visit, additional usage of staff PPE, and extensive cleaning of exam room while observing appropriate contact time as indicated for disinfecting solutions.  ? ?

## 2021-11-26 NOTE — Addendum Note (Signed)
Addended by: Lesleigh Noe on: 11/26/2021 02:37 PM ? ? Modules accepted: Orders, Level of Service ? ?

## 2021-12-06 ENCOUNTER — Other Ambulatory Visit: Payer: Self-pay | Admitting: Family Medicine

## 2021-12-06 DIAGNOSIS — I10 Essential (primary) hypertension: Secondary | ICD-10-CM

## 2021-12-17 IMAGING — DX DG CHEST 2V
2 series · 2 of 2 positions shown · non-contrast
Comparison: None

CLINICAL DATA: Hypertensive today, chest pressure, hypertension,
diabetes mellitus

EXAM:
CHEST - 2 VIEW

[w chest pa]
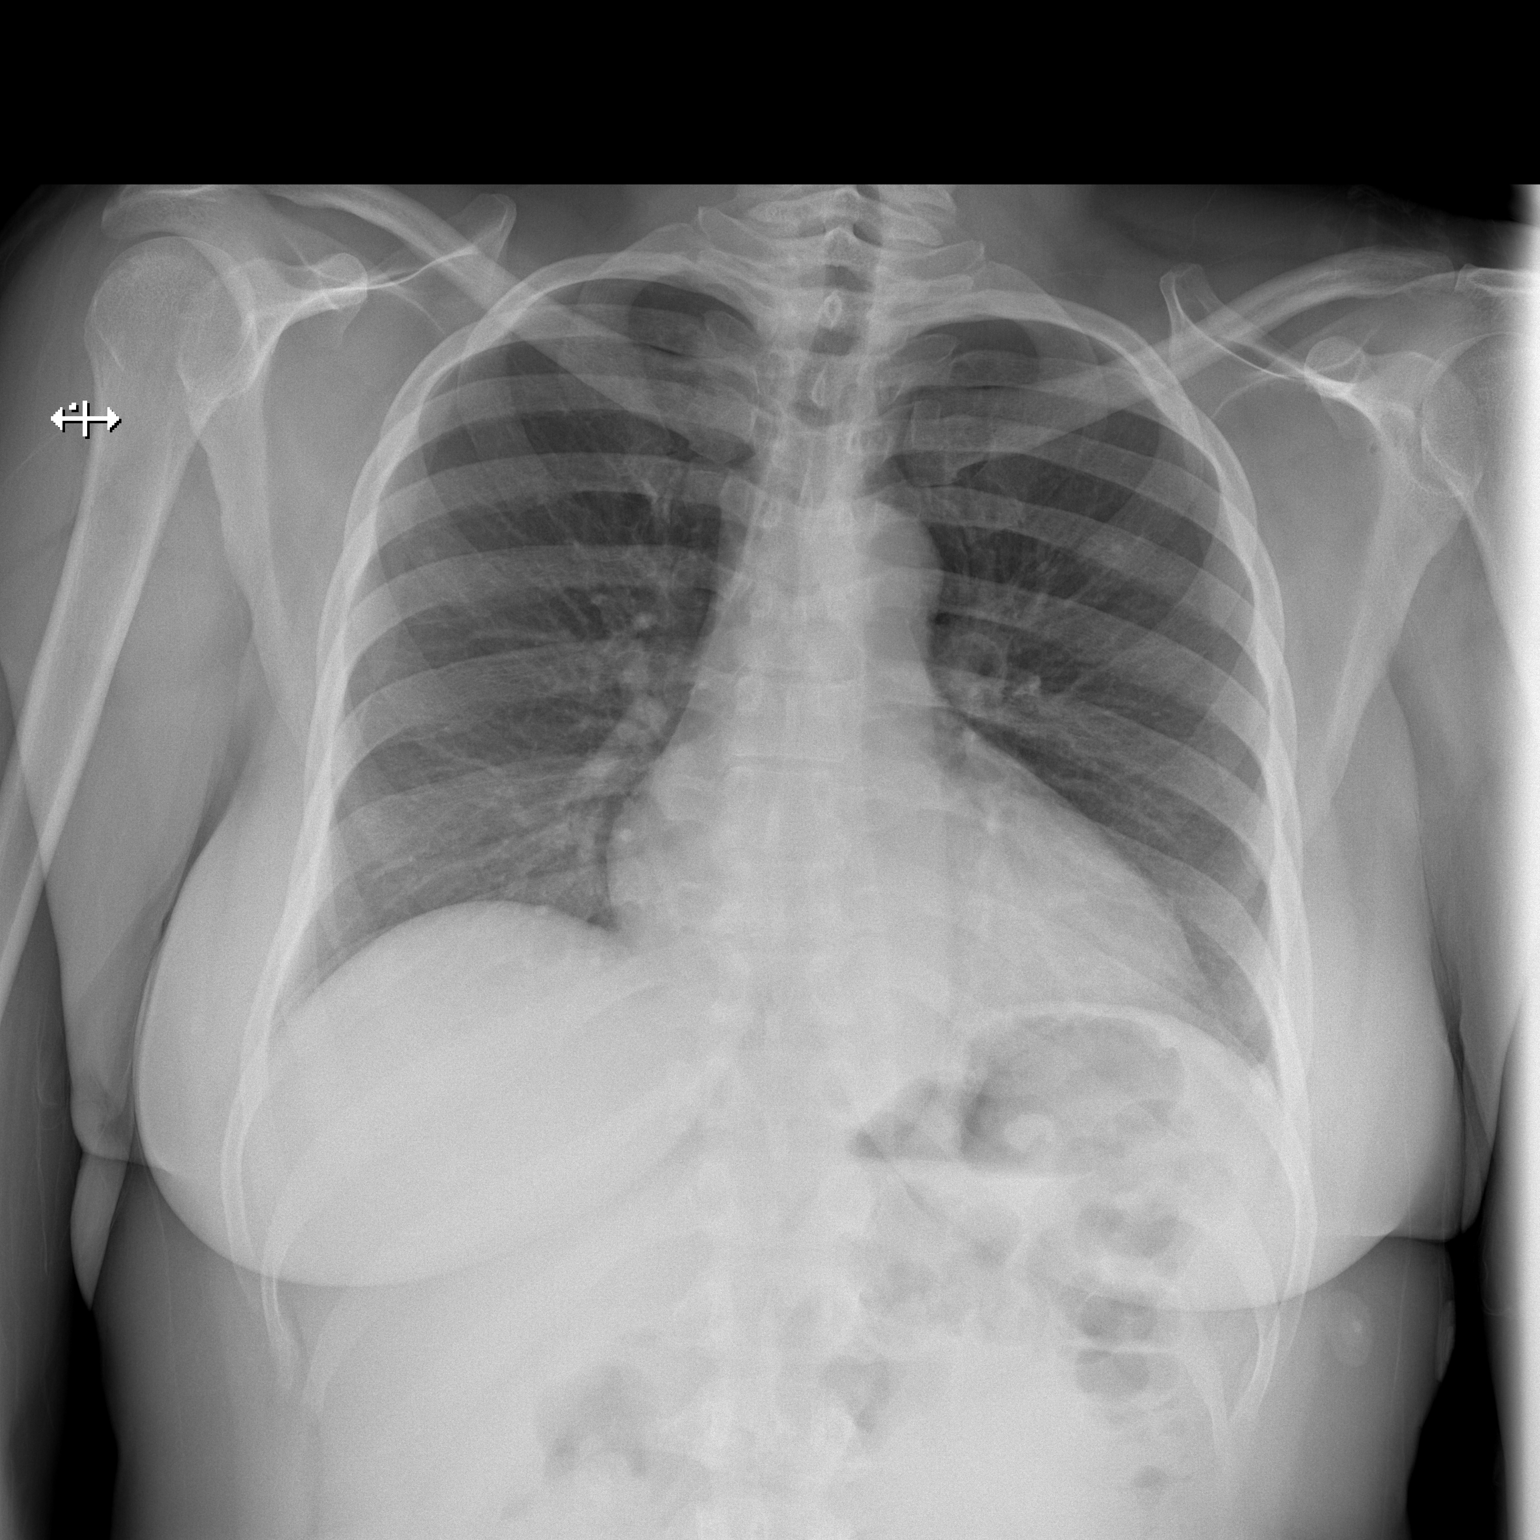

[w chest lat]
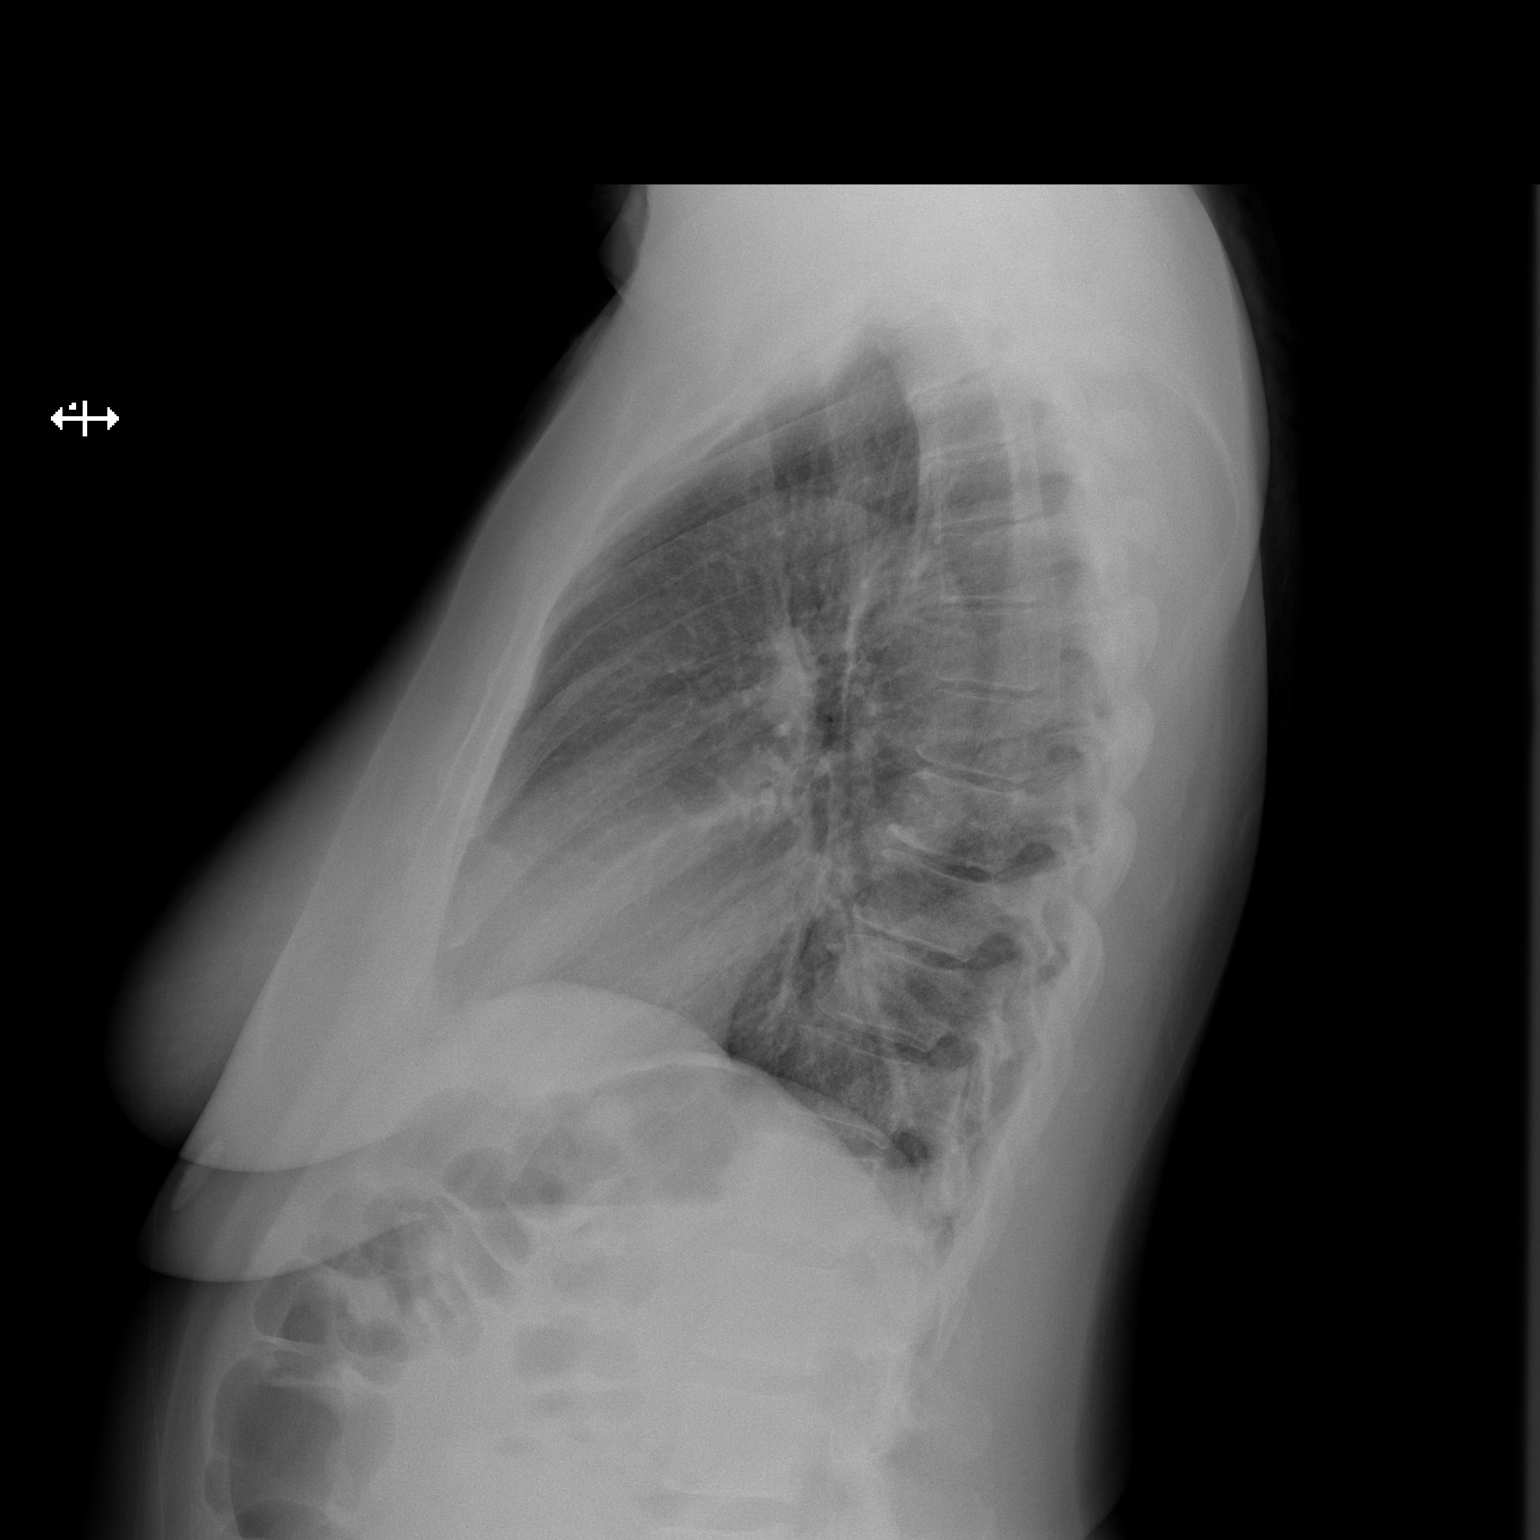

[2 of 2 positions shown; findings below may reference images not displayed]

FINDINGS: Minimal enlargement of cardiac silhouette.

Mediastinal contours and pulmonary vascularity normal.

Lungs clear.

No pulmonary infiltrate, pleural effusion or pneumothorax.

Osseous structures unremarkable
IMPRESSION: Minimal enlargement of cardiac silhouette.

No acute abnormalities.

## 2022-01-08 ENCOUNTER — Ambulatory Visit: Payer: 59 | Admitting: Endocrinology

## 2022-01-13 ENCOUNTER — Other Ambulatory Visit: Payer: Self-pay | Admitting: Endocrinology

## 2022-01-13 DIAGNOSIS — E119 Type 2 diabetes mellitus without complications: Secondary | ICD-10-CM

## 2022-01-15 ENCOUNTER — Other Ambulatory Visit (INDEPENDENT_AMBULATORY_CARE_PROVIDER_SITE_OTHER): Payer: 59

## 2022-01-15 DIAGNOSIS — E119 Type 2 diabetes mellitus without complications: Secondary | ICD-10-CM

## 2022-01-15 LAB — COMPREHENSIVE METABOLIC PANEL
ALT: 16 U/L (ref 0–35)
AST: 16 U/L (ref 0–37)
Albumin: 4.4 g/dL (ref 3.5–5.2)
Alkaline Phosphatase: 47 U/L (ref 39–117)
BUN: 22 mg/dL (ref 6–23)
CO2: 30 mEq/L (ref 19–32)
Calcium: 10 mg/dL (ref 8.4–10.5)
Chloride: 104 mEq/L (ref 96–112)
Creatinine, Ser: 0.98 mg/dL (ref 0.40–1.20)
GFR: 65.81 mL/min (ref >60.00)
Glucose, Bld: 115 mg/dL — ABNORMAL HIGH (ref 70–99)
Potassium: 3.4 mEq/L — ABNORMAL LOW (ref 3.5–5.1)
Sodium: 142 mEq/L (ref 135–145)
Total Bilirubin: 0.4 mg/dL (ref 0.2–1.2)
Total Protein: 7.2 g/dL (ref 6.0–8.3)

## 2022-01-15 LAB — HEMOGLOBIN A1C: Hgb A1c MFr Bld: 6.9 % — ABNORMAL HIGH (ref 4.6–6.5)

## 2022-01-15 LAB — MICROALBUMIN / CREATININE URINE RATIO
Creatinine,U: 164 mg/dL
Microalb Creat Ratio: 0.8 mg/g (ref 0.0–30.0)
Microalb, Ur: 1.3 mg/dL (ref 0.0–1.9)

## 2022-01-15 LAB — LIPID PANEL
Cholesterol: 127 mg/dL (ref 0–200)
HDL: 40.4 mg/dL (ref >39.00)
LDL Cholesterol: 72 mg/dL (ref 0–99)
NonHDL: 86.72
Total CHOL/HDL Ratio: 3
Triglycerides: 75 mg/dL (ref 0.0–149.0)
VLDL: 15 mg/dL (ref 0.0–40.0)

## 2022-01-20 ENCOUNTER — Ambulatory Visit: Payer: 59 | Admitting: Endocrinology

## 2022-01-20 NOTE — Progress Notes (Deleted)
Patient ID: Michelle Castillo, female   DOB: 12-26-1965, 56 y.o.   MRN: [id number]           Reason for Appointment: Type II Diabetes follow-up   History of Present Illness   Diagnosis date:   Previous history:  Recent history:     Non-insulin hypoglycemic drugs:      Insulin regimen:            Side effects from medications: None  Current self management, blood sugar patterns and problems identified:    Exercise: Diet management:      Monitors blood glucose: Once a day.    Glucometer: One Touch.           Blood Glucose readings from meter download:   PRE-MEAL Fasting Lunch Dinner Bedtime Overall  Glucose range:       Mean/median:        POST-MEAL PC Breakfast PC Lunch PC Dinner  Glucose range:     Mean/median:        Hypoglycemia:  none                        Dietician visit: Most recent:      Weight control:  Wt Readings from Last 3 Encounters:  11/26/21 164 lb 12.8 oz (74.8 kg)  09/04/21 166 lb 3.2 oz (75.4 kg)  07/03/21 168 lb (76.2 kg)          Complications:     Diabetes labs:  Lab Results  Component Value Date   HGBA1C 6.9 (H) 01/15/2022   HGBA1C 6.8 (A) 09/04/2021   HGBA1C 7.9 (A) 06/05/2021   Lab Results  Component Value Date   MICROALBUR 1.3 01/15/2022   LDLCALC 72 01/15/2022   CREATININE 0.98 01/15/2022     Allergies as of 01/20/2022   No Known Allergies      Medication List        Accurate as of Jan 20, 2022  8:46 AM. If you have any questions, ask your nurse or doctor.          atorvastatin 10 MG tablet Commonly known as: LIPITOR TAKE 1 TABLET BY MOUTH  DAILY   azaTHIOprine 50 MG tablet Commonly known as: IMURAN Take 50 mg by mouth daily.   dorzolamide-timolol 22.3-6.8 MG/ML ophthalmic solution Commonly known as: COSOPT Place 1 drop into both eyes 2 (two) times daily.   empagliflozin 10 MG Tabs tablet Commonly known as: Jardiance Take 1 tablet (10 mg total) by mouth daily before breakfast.   fluticasone 50  MCG/ACT nasal spray Commonly known as: FLONASE Place 2 sprays into both nostrils daily.   hydrochlorothiazide 25 MG tablet Commonly known as: HYDRODIURIL TAKE 1 TABLET BY MOUTH  DAILY   linagliptin 5 MG Tabs tablet Commonly known as: Tradjenta Take 1 tablet (5 mg total) by mouth daily.   lisinopril 40 MG tablet Commonly known as: ZESTRIL TAKE 1 TABLET BY MOUTH  DAILY   metFORMIN 500 MG 24 hr tablet Commonly known as: GLUCOPHAGE-XR Take 4 tablets (2,000 mg total) by mouth daily.   multivitamin tablet Take 1 tablet by mouth daily.   solifenacin 5 MG tablet Commonly known as: VESICARE Take 1 tablet (5 mg total) by mouth daily.   SUMAtriptan 100 MG tablet Commonly known as: IMITREX TAKE 1 TABLET AT ONSET-MAY REPEAT IN 2 HOURS AS NEEDED- NO MORE THAN 2 IN 24 HOURS. What changed:  how much to take how to take this when to take  this reasons to take this additional instructions        Allergies: No Known Allergies  Past Medical History:  Diagnosis Date   ANEMIA, IRON DEFICIENCY 2018   DIABETES MELLITUS, TYPE II    Headache(784.0)    HYPERCHOLESTEROLEMIA    HYPERTENSION    Hyperthyroidism    no meds, slightly elevated, MD will recheck in 3 months   Migraines    Peripheral focal chorioretinal inflammation of both eyes    URINARY INCONTINENCE     Past Surgical History:  Procedure Laterality Date   ABDOMINAL HYSTERECTOMY     APPENDECTOMY     BARTHOLIN CYST MARSUPIALIZATION Right 02/22/2017   Procedure: BARTHOLIN CYST MARSUPIALIZATION;  Surgeon: Salvadore Dom, MD;  Location: [facility];  Service: Gynecology;  Laterality: Right;   [place]   COLONOSCOPY  last 05/25/2013   CYSTOSCOPY N/A 02/22/2017   Procedure: CYSTOSCOPY;  Surgeon: Salvadore Dom, MD;  Location: [facility];  Service: Gynecology;  Laterality: N/A;   LAPAROSCOPIC LYSIS OF ADHESIONS N/A 02/22/2017   Procedure: EXTENSIVE LAPAROSCOPIC LYSIS OF ADHESIONS;  Surgeon: Salvadore Dom,  MD;  Location: [facility];  Service: Gynecology;  Laterality: N/A;  Dr. Kieth Brightly    OOPHORECTOMY Left 02/22/2017   Procedure: OOPHORECTOMY;  Surgeon: Salvadore Dom, MD;  Location: [facility];  Service: Gynecology;  Laterality: Left;   TONSILLECTOMY AND ADENOIDECTOMY     TOTAL LAPAROSCOPIC HYSTERECTOMY WITH SALPINGECTOMY Bilateral 02/22/2017   Procedure: HYSTERECTOMY TOTAL LAPAROSCOPIC WITH SALPINGECTOMY;  Surgeon: Salvadore Dom, MD;  Location: [facility];  Service: Gynecology;  Laterality: Bilateral;    Family History  Problem Relation Age of Onset   Diabetes Mother    Thyroid disease Mother    Hypertension Mother    Diabetes Father    Colon cancer Father 60       2002   Hypertension Father    Stroke Father 27   Heart disease Father        stints   Diabetes Brother    Thyroid disease Brother    Breast cancer Maternal Aunt    Breast cancer Maternal Aunt    Breast cancer Maternal Aunt    Breast cancer Maternal Aunt    Breast cancer Maternal Aunt    Breast cancer Maternal Aunt    Breast cancer Cousin    Colon polyps Neg Hx    Esophageal cancer Neg Hx    Rectal cancer Neg Hx    Stomach cancer Neg Hx     Social History:  reports that she has never smoked. She has never used smokeless tobacco. She reports current alcohol use of about 2.0 standard drinks per week. She reports that she does not use drugs.  Review of Systems:  Hypertension:    BP Readings from Last 3 Encounters:  11/26/21 126/82  09/04/21 (!) 136/98  07/03/21 128/88    Lipids:     Lab Results  Component Value Date   CHOL 127 01/15/2022   CHOL 143 04/10/2020   CHOL 207 (H) 11/27/2019   Lab Results  Component Value Date   HDL 40.40 01/15/2022   HDL 55.50 04/10/2020   HDL 42.00 11/27/2019   Lab Results  Component Value Date   LDLCALC 72 01/15/2022   LDLCALC 76 04/10/2020   LDLCALC 143 (H) 11/27/2019   Lab Results  Component Value Date   TRIG 75.0 01/15/2022   TRIG 59.0 04/10/2020   TRIG  112.0 11/27/2019   Lab Results  Component Value  Date   CHOLHDL 3 01/15/2022   CHOLHDL 3 04/10/2020   CHOLHDL 5 11/27/2019   No results found for: LDLDIRECT   Examination:   LMP 12/29/2016   There is no height or weight on file to calculate BMI.    ASSESSMENT/ PLAN:    Diabetes type 2:   Blood glucose control  There are no Patient Instructions on file for this visit.   Elayne Snare 01/20/2022, 8:46 AM

## 2022-01-26 NOTE — Progress Notes (Unsigned)
Patient ID: Michelle Castillo, female   DOB: [birth year], 56 y.o.   MRN: [id number]           Reason for Appointment: Type II Diabetes follow-up   History of Present Illness   Diagnosis date: 2005  Previous history:  She thinks her blood sugars were extremely high at the time of diagnosis but no records are available Also at that time she weighed over 200 pounds Has been managed mostly with metformin. Michelle Castillo was added in 7/22 Tradjenta was added in 10/22  Recent history:     Non-insulin hypoglycemic drugs: Metformin ER 2 g daily, Jardiance 10 mg daily, Tradjenta 5 mg daily             Side effects from medications: Nausea from Rybelsus  Current self management, blood sugar patterns and problems identified:  A1c is 6.9 and stable She has been on stable medications as above and appears to have better control with adding Tradjenta in 10/22 However she is concerned about using multiple medications and wants to reduce them Does not appear that she is checking her blood sugars much at all and only sporadically in the morning If she does have any readings after meals they are usually less than 200 She has maintained her weight about the same this year  Exercise: She walks 3/7 days, about 30 minutes with her with her dog  Diet management: She is avoiding all drinks with sugar and usually trying to cut back on portions      Monitors blood glucose: Once a day.    Glucometer: Accu-Chek.           Blood Glucose readings from recall  PRE-MEAL Fasting Lunch Dinner Bedtime Overall  Glucose range: 120-130   <200   Mean/median:          Hypoglycemia:  none                        Dietician visit: Most recent: At diagnosis  Weight control: Baseline weight 200+  Wt Readings from Last 3 Encounters:  01/27/22 165 lb (74.8 kg)  11/26/21 164 lb 12.8 oz (74.8 kg)  09/04/21 166 lb 3.2 oz (75.4 kg)            Diabetes labs:  Lab Results  Component Value Date   HGBA1C 6.9 (H)  01/15/2022   HGBA1C 6.8 (A) 09/04/2021   HGBA1C 7.9 (A) 06/05/2021   Lab Results  Component Value Date   MICROALBUR 1.3 01/15/2022   LDLCALC 72 01/15/2022   CREATININE 0.98 01/15/2022     Allergies as of 01/27/2022   No Known Allergies      Medication List        Accurate as of Jan 27, 2022 12:28 PM. If you have any questions, ask your nurse or doctor.          atorvastatin 10 MG tablet Commonly known as: LIPITOR TAKE 1 TABLET BY MOUTH  DAILY   azaTHIOprine 50 MG tablet Commonly known as: IMURAN Take 50 mg by mouth daily.   dorzolamide-timolol 22.3-6.8 MG/ML ophthalmic solution Commonly known as: COSOPT Place 1 drop into both eyes 2 (two) times daily.   empagliflozin 10 MG Tabs tablet Commonly known as: Jardiance Take 1 tablet (10 mg total) by mouth daily before breakfast.   fluticasone 50 MCG/ACT nasal spray Commonly known as: FLONASE Place 2 sprays into both nostrils daily.   hydrochlorothiazide 25 MG tablet Commonly known as: HYDRODIURIL TAKE 1  TABLET BY MOUTH  DAILY   linagliptin 5 MG Tabs tablet Commonly known as: Tradjenta Take 1 tablet (5 mg total) by mouth daily.   lisinopril 40 MG tablet Commonly known as: ZESTRIL TAKE 1 TABLET BY MOUTH  DAILY   metFORMIN 500 MG 24 hr tablet Commonly known as: GLUCOPHAGE-XR Take 4 tablets (2,000 mg total) by mouth daily.   multivitamin tablet Take 1 tablet by mouth daily.   solifenacin 5 MG tablet Commonly known as: VESICARE Take 1 tablet (5 mg total) by mouth daily.   SUMAtriptan 100 MG tablet Commonly known as: IMITREX TAKE 1 TABLET AT ONSET-MAY REPEAT IN 2 HOURS AS NEEDED- NO MORE THAN 2 IN 24 HOURS. What changed:  how much to take how to take this when to take this reasons to take this additional instructions   tirzepatide 2.5 MG/0.5ML Pen Commonly known as: MOUNJARO Inject 2.5 mg into the skin once a week. Started by: Elayne Snare, MD        Allergies: No Known Allergies  Past  Medical History:  Diagnosis Date   ANEMIA, IRON DEFICIENCY 2018   DIABETES MELLITUS, TYPE II    Headache(784.0)    HYPERCHOLESTEROLEMIA    HYPERTENSION    Hyperthyroidism    no meds, slightly elevated, MD will recheck in 3 months   Migraines    Peripheral focal chorioretinal inflammation of both eyes    URINARY INCONTINENCE     Past Surgical History:  Procedure Laterality Date   ABDOMINAL HYSTERECTOMY     APPENDECTOMY     BARTHOLIN CYST MARSUPIALIZATION Right 02/22/2017   Procedure: BARTHOLIN CYST MARSUPIALIZATION;  Surgeon: Salvadore Dom, MD;  Location: [facility];  Service: Gynecology;  Laterality: Right;   [place]   COLONOSCOPY  last 05/25/2013   CYSTOSCOPY N/A 02/22/2017   Procedure: CYSTOSCOPY;  Surgeon: Salvadore Dom, MD;  Location: [facility];  Service: Gynecology;  Laterality: N/A;   LAPAROSCOPIC LYSIS OF ADHESIONS N/A 02/22/2017   Procedure: EXTENSIVE LAPAROSCOPIC LYSIS OF ADHESIONS;  Surgeon: Salvadore Dom, MD;  Location: [facility];  Service: Gynecology;  Laterality: N/A;  Dr. Kieth Brightly    OOPHORECTOMY Left 02/22/2017   Procedure: OOPHORECTOMY;  Surgeon: Salvadore Dom, MD;  Location: [facility];  Service: Gynecology;  Laterality: Left;   TONSILLECTOMY AND ADENOIDECTOMY     TOTAL LAPAROSCOPIC HYSTERECTOMY WITH SALPINGECTOMY Bilateral 02/22/2017   Procedure: HYSTERECTOMY TOTAL LAPAROSCOPIC WITH SALPINGECTOMY;  Surgeon: Salvadore Dom, MD;  Location: [facility];  Service: Gynecology;  Laterality: Bilateral;    Family History  Problem Relation Age of Onset   Diabetes Mother    Thyroid disease Mother    Hypertension Mother    Diabetes Father    Colon cancer Father 60       2002   Hypertension Father    Stroke Father 43   Heart disease Father        stints   Diabetes Brother    Thyroid disease Brother    Breast cancer Maternal Aunt    Breast cancer Maternal Aunt    Breast cancer Maternal Aunt    Breast cancer Maternal Aunt    Breast  cancer Maternal Aunt    Breast cancer Maternal Aunt    Breast cancer Cousin    Colon polyps Neg Hx    Esophageal cancer Neg Hx    Rectal cancer Neg Hx    Stomach cancer Neg Hx     Social History:  reports that she has never smoked.  She has never used smokeless tobacco. She reports current alcohol use of about 2.0 standard drinks per week. She reports that she does not use drugs.  Review of Systems:  Last diabetic eye exam date:?  2021  Last foot exam date: 10/22 No history of numbness or tingling in her feet  Hypertension: Currently taking HCTZ and lisinopril 40 mg with good control  BP Readings from Last 3 Encounters:  01/27/22 122/78  11/26/21 126/82  09/04/21 (!) 136/98    Lipids: She has been prescribed 10 mg of atorvastatin by her PCP which she wants to stop this as her cholesterol improved    Lab Results  Component Value Date   CHOL 127 01/15/2022   CHOL 143 04/10/2020   CHOL 207 (H) 11/27/2019   Lab Results  Component Value Date   HDL 40.40 01/15/2022   HDL 55.50 04/10/2020   HDL 42.00 11/27/2019   Lab Results  Component Value Date   LDLCALC 72 01/15/2022   LDLCALC 76 04/10/2020   LDLCALC 143 (H) 11/27/2019   Lab Results  Component Value Date   TRIG 75.0 01/15/2022   TRIG 59.0 04/10/2020   TRIG 112.0 11/27/2019   Lab Results  Component Value Date   CHOLHDL 3 01/15/2022   CHOLHDL 3 04/10/2020   CHOLHDL 5 11/27/2019   No results found for: LDLDIRECT   Examination:   BP 122/78   Pulse 87   Ht 5' 6.5" (1.689 m)   Wt 165 lb (74.8 kg)   LMP 12/29/2016   SpO2 97%   BMI 26.23 kg/m   Body mass index is 26.23 kg/m.    ASSESSMENT/ PLAN:    Diabetes type 2 non-insulin-dependent:   Current regimen: Metformin, Tradjenta, Jardiance 10 mg  Blood glucose control is fairly good with A1c 6.9 However A1c has been as low as 6% in the past  Control was improved by adding Tradjenta last fall but she is concerned about its cost and availability at the  pharmacy Also she wants to cut back on medications  Currently not monitoring blood sugars much at all and only few fasting readings which appear to be fairly good including in the lab Overall doing fairly well with her diet and please meal planning However may be able to increase her exercise frequency  Since she was intolerant to Rybelsus orally she can try [facility] and if this is well-tolerated and effective may be able to stop Willey Blade also will need to be stopped with starting this   Discussed with the patient the action of GIP/GLP-1 drugs, the effects on pancreatic and liver function, effects on brain and stomach with improved satiety, slowing gastric emptying, improving satiety and reducing liver glucose output.  Discussed the effects on promoting weight loss. Explained possible side effects of MOUNJARO, most commonly nausea that usually improves over time; discussed safety information in package insert.  Demonstrated the medication injection device and injection technique to the patient.  Showed patient the injection sites for his medication To start with 2.5 mg dosage weekly for the first 4 injections and then increase the dose to 5 mg weekly  Patient brochure on Mounjaro and explained use of the available co-pay card, sample of the first month supply given She will call after the third injection to let us know if she is doing well and can go to 5 mg weekly  She will need a new glucose monitor and One Touch Verio will be prescribed  HYPERTENSION: Well-controlled However she needs to  discuss her hypokalemia with PCP, likely can reduce her HCTZ  Lipid management: Discussed that statin drugs are useful for cardiovascular risk reduction regardless of cholesterol and she should continue this  Patient Instructions  Check blood sugars on waking up 2-3 days a week  Also check blood sugars about 2 hours after meals and do this after different meals by rotation  Recommended  blood sugar levels on waking up are 90-130 and about 2 hours after meal is 130-160  Please bring your blood sugar monitor to each visit, thank you    Elayne Snare 01/27/2022, 12:28 PM

## 2022-01-27 ENCOUNTER — Ambulatory Visit: Payer: 59 | Admitting: Endocrinology

## 2022-01-27 ENCOUNTER — Encounter: Payer: Self-pay | Admitting: Endocrinology

## 2022-01-27 ENCOUNTER — Encounter: Payer: Self-pay | Admitting: Family Medicine

## 2022-01-27 VITALS — BP 122/78 | HR 87 | Ht 66.5 in | Wt 165.0 lb

## 2022-01-27 DIAGNOSIS — I10 Essential (primary) hypertension: Secondary | ICD-10-CM | POA: Diagnosis not present

## 2022-01-27 DIAGNOSIS — E119 Type 2 diabetes mellitus without complications: Secondary | ICD-10-CM

## 2022-01-27 MED ORDER — TIRZEPATIDE 2.5 MG/0.5ML ~~LOC~~ SOAJ: 2.5000 mg | SUBCUTANEOUS | 0 refills | Status: DC

## 2022-01-27 NOTE — Patient Instructions (Signed)
Check blood sugars on waking up 2-3 days a week  Also check blood sugars about 2 hours after meals and do this after different meals by rotation  Recommended blood sugar levels on waking up are 90-130 and about 2 hours after meal is 130-160  Please bring your blood sugar monitor to each visit, thank you   

## 2022-02-08 ENCOUNTER — Other Ambulatory Visit: Payer: Self-pay | Admitting: Family Medicine

## 2022-02-08 DIAGNOSIS — I10 Essential (primary) hypertension: Secondary | ICD-10-CM

## 2022-02-09 ENCOUNTER — Other Ambulatory Visit: Payer: Self-pay | Admitting: Family Medicine

## 2022-02-09 DIAGNOSIS — I10 Essential (primary) hypertension: Secondary | ICD-10-CM

## 2022-02-15 ENCOUNTER — Other Ambulatory Visit: Payer: Self-pay | Admitting: Family Medicine

## 2022-02-15 DIAGNOSIS — I10 Essential (primary) hypertension: Secondary | ICD-10-CM

## 2022-02-16 NOTE — Telephone Encounter (Signed)
Pt scheduled for CPE on 03/16/22

## 2022-02-17 ENCOUNTER — Encounter: Payer: Self-pay | Admitting: Endocrinology

## 2022-02-18 ENCOUNTER — Other Ambulatory Visit: Payer: Self-pay

## 2022-02-18 DIAGNOSIS — E119 Type 2 diabetes mellitus without complications: Secondary | ICD-10-CM

## 2022-02-18 MED ORDER — TRIAMTERENE-HCTZ 37.5-25 MG PO TABS: 1.0000 | ORAL_TABLET | Freq: Every day | ORAL | 1 refills | Status: DC

## 2022-02-23 ENCOUNTER — Other Ambulatory Visit: Payer: Self-pay

## 2022-02-23 MED ORDER — EMPAGLIFLOZIN 10 MG PO TABS: 10.0000 mg | ORAL_TABLET | Freq: Every day | ORAL | 3 refills | Status: DC

## 2022-02-23 MED ORDER — TIRZEPATIDE 5 MG/0.5ML ~~LOC~~ SOAJ: 5.0000 mg | SUBCUTANEOUS | 0 refills | Status: DC

## 2022-03-16 ENCOUNTER — Ambulatory Visit (INDEPENDENT_AMBULATORY_CARE_PROVIDER_SITE_OTHER): Payer: 59 | Admitting: Family Medicine

## 2022-03-16 VITALS — BP 108/78 | HR 102 | Temp 97.6°F | Ht 66.0 in | Wt 152.4 lb

## 2022-03-16 DIAGNOSIS — R634 Abnormal weight loss: Secondary | ICD-10-CM

## 2022-03-16 DIAGNOSIS — Z0000 Encounter for general adult medical examination without abnormal findings: Secondary | ICD-10-CM | POA: Diagnosis not present

## 2022-03-16 DIAGNOSIS — I10 Essential (primary) hypertension: Secondary | ICD-10-CM | POA: Diagnosis not present

## 2022-03-16 LAB — CBC WITH DIFFERENTIAL/PLATELET
Basophils Absolute: 0.1 10*3/uL (ref 0.0–0.1)
Basophils Relative: 1.7 % (ref 0.0–3.0)
Eosinophils Absolute: 0.1 10*3/uL (ref 0.0–0.7)
Eosinophils Relative: 2.1 % (ref 0.0–5.0)
HCT: 40.5 % (ref 36.0–46.0)
Hemoglobin: 13.6 g/dL (ref 12.0–15.0)
Lymphocytes Relative: 33.1 % (ref 12.0–46.0)
Lymphs Abs: 1.9 10*3/uL (ref 0.7–4.0)
MCHC: 33.6 g/dL (ref 30.0–36.0)
MCV: 82.7 fl (ref 78.0–100.0)
Monocytes Absolute: 0.4 10*3/uL (ref 0.1–1.0)
Monocytes Relative: 7.5 % (ref 3.0–12.0)
Neutro Abs: 3.2 10*3/uL (ref 1.4–7.7)
Neutrophils Relative %: 55.6 % (ref 43.0–77.0)
Platelets: 351 10*3/uL (ref 150.0–400.0)
RBC: 4.89 Mil/uL (ref 3.87–5.11)
RDW: 14.1 % (ref 11.5–15.5)
WBC: 5.7 10*3/uL (ref 4.0–10.5)

## 2022-03-16 LAB — COMPREHENSIVE METABOLIC PANEL
ALT: 14 U/L (ref 0–35)
AST: 14 U/L (ref 0–37)
Albumin: 5 g/dL (ref 3.5–5.2)
Alkaline Phosphatase: 53 U/L (ref 39–117)
BUN: 23 mg/dL (ref 6–23)
CO2: 30 mEq/L (ref 19–32)
Calcium: 11 mg/dL — ABNORMAL HIGH (ref 8.4–10.5)
Chloride: 100 mEq/L (ref 96–112)
Creatinine, Ser: 1.44 mg/dL — ABNORMAL HIGH (ref 0.40–1.20)
GFR: 41.42 mL/min — ABNORMAL LOW (ref >60.00)
Glucose, Bld: 122 mg/dL — ABNORMAL HIGH (ref 70–99)
Potassium: 3.9 mEq/L (ref 3.5–5.1)
Sodium: 139 mEq/L (ref 135–145)
Total Bilirubin: 0.6 mg/dL (ref 0.2–1.2)
Total Protein: 7.7 g/dL (ref 6.0–8.3)

## 2022-03-16 LAB — TSH: TSH: 0.5 u[IU]/mL (ref 0.35–5.50)

## 2022-03-16 NOTE — Patient Instructions (Addendum)
Stop Mounjaro  Blood pressure - cut lisinopril in 1/2 - take 1/2 dose - check blood pressure - restart full pill if high  Check in with endocrine about the [facility]

## 2022-03-16 NOTE — Assessment & Plan Note (Signed)
Pressure slightly low, patient not feeling well.  Discussed decreasing lisinopril to 20 mg, continue hydrochlorothiazide.  Recheck potassium today.

## 2022-03-16 NOTE — Assessment & Plan Note (Signed)
Anorexia and stomach cramping in the setting of increased dose of Mounjaro.  Advised stopping Mounjaro, monitor for symptom improvement or change. Labs today to assess for possible other causes.  If no improvement off of Mounjaro will consider additional work-up.  Overall abdominal exam was benign today.

## 2022-03-16 NOTE — Progress Notes (Signed)
Annual Exam   Chief Complaint:  Chief Complaint  Patient presents with   Annual Exam   Abdominal Pain    Sharp pains on and off x 2 weeks. Thinks it may be from mounjaro increase, but not sure.     History of Present Illness:  Michelle Castillo is a 56 y.o. G3P1011 who LMP was Patient's last menstrual period was 12/29/2016., presents today for her annual examination.    #Abdominal pain - took 2.5 mg mounjaro - was doing fine - but they increased it and the sharp pain started - no appetite - has been on the higher dose x 3 weeks - follows with endocrine - the goal was to stop the other medications - no fevers/chills  Nutrition She does get adequate calcium and Vitamin D in her diet. Diet: ok - but not eating due to new medication Exercise: walking 30 minutes, 3 times a week    Social History   Tobacco Use  Smoking Status Never  Smokeless Tobacco Never   Social History   Substance and Sexual Activity  Alcohol Use Yes   Alcohol/week: 2.0 standard drinks of alcohol   Types: 2 Standard drinks or equivalent per week   Comment: a few drinks a week, weekend   Social History   Substance and Sexual Activity  Drug Use No     General Health Dentist in the last year: Yes Eye doctor: yes  Safety The patient wears seatbelts: yes.     The patient feels safe at home and in their relationships: yes.   Menstrual:  Symptoms of menopause: hysterectomy 2018, starting to get hot flashes  GYN She is single partner, contraception - status post hysterectomy.    Cervical Cancer Screening (21-65):   Last Pap:   no hx of abnormal paps, s/p hysterectomy  Breast Cancer Screening (Age 20-74):  There is FH of breast cancer. There is no FH of ovarian cancer. BRCA screening Yes.  Last Mammogram: 09/2021 The patient does want a mammogram this year.    Colon Cancer Screening:  Age 34-75 yo - benefits outweigh the risk. Adults 25-85 yo who have never been screened benefit.   Benefits: 134000 people in 2016 will be diagnosed and 49,000 will die - early detection helps [place]: Complications 2/2 to colonoscopy High Risk (Colonoscopy): genetic disorder (Lynch syndrome or familial adenomatous polyposis), personal hx of IBD, previous adenomatous polyp, or previous colorectal cancer, FamHx start 10 years before the age at diagnosis, increased in males and black race  Options:  FIT - looks for hemoglobin (blood in the stool) - specific and fairly sensitive - must be done annually Cologuard - looks for DNA and blood - more sensitive - therefore can have more false positives, every 3 years Colonoscopy - every 10 years if normal - sedation, bowl prep, must have someone drive you  Shared decision making and the patient had decided to do colonoscopy 2026 due to family history.   Social History   Tobacco Use  Smoking Status Never  Smokeless Tobacco Never    Lung Cancer Screening (Ages 88-28): not applicable   Weight Wt Readings from Last 3 Encounters:  03/16/22 152 lb 6 oz (69.1 kg)  01/27/22 165 lb (74.8 kg)  11/26/21 164 lb 12.8 oz (74.8 kg)   Patient has normal BMI  BMI Readings from Last 1 Encounters:  03/16/22 24.59 kg/m     Chronic disease screening Blood pressure monitoring:  BP Readings from Last 3 Encounters:  03/16/22  108/78  01/27/22 122/78  11/26/21 126/82    Lipid Monitoring: Indication for screening: age >30, obesity, diabetes, family hx, CV risk factors.  Lipid screening: Not Indicated  Lab Results  Component Value Date   CHOL 127 01/15/2022   HDL 40.40 01/15/2022   LDLCALC 72 01/15/2022   TRIG 75.0 01/15/2022   CHOLHDL 3 01/15/2022     Diabetes Screening: age >42, overweight, family hx, PCOS, hx of gestational diabetes, at risk ethnicity Diabetes Screening screening: Not Indicated  Lab Results  Component Value Date   HGBA1C 6.9 (H) 01/15/2022     Past Medical History:  Diagnosis Date   ANEMIA, IRON DEFICIENCY 2018    DIABETES MELLITUS, TYPE II    Headache(784.0)    HYPERCHOLESTEROLEMIA    HYPERTENSION    Hyperthyroidism    no meds, slightly elevated, MD will recheck in 3 months   Migraines    Peripheral focal chorioretinal inflammation of both eyes    URINARY INCONTINENCE     Past Surgical History:  Procedure Laterality Date   ABDOMINAL HYSTERECTOMY     APPENDECTOMY     BARTHOLIN CYST MARSUPIALIZATION Right 02/22/2017   Procedure: BARTHOLIN CYST MARSUPIALIZATION;  Surgeon: Salvadore Dom, MD;  Location: [facility];  Service: Gynecology;  Laterality: Right;   [place]   COLONOSCOPY  last 05/25/2013   CYSTOSCOPY N/A 02/22/2017   Procedure: CYSTOSCOPY;  Surgeon: Salvadore Dom, MD;  Location: [facility];  Service: Gynecology;  Laterality: N/A;   LAPAROSCOPIC LYSIS OF ADHESIONS N/A 02/22/2017   Procedure: EXTENSIVE LAPAROSCOPIC LYSIS OF ADHESIONS;  Surgeon: Salvadore Dom, MD;  Location: [facility];  Service: Gynecology;  Laterality: N/A;  Dr. Kieth Brightly    OOPHORECTOMY Left 02/22/2017   Procedure: OOPHORECTOMY;  Surgeon: Salvadore Dom, MD;  Location: [facility];  Service: Gynecology;  Laterality: Left;   TONSILLECTOMY AND ADENOIDECTOMY     TOTAL LAPAROSCOPIC HYSTERECTOMY WITH SALPINGECTOMY Bilateral 02/22/2017   Procedure: HYSTERECTOMY TOTAL LAPAROSCOPIC WITH SALPINGECTOMY;  Surgeon: Salvadore Dom, MD;  Location: [facility];  Service: Gynecology;  Laterality: Bilateral;    Prior to Admission medications   Medication Sig Start Date End Date Taking? Authorizing Provider  atorvastatin (LIPITOR) 10 MG tablet TAKE 1 TABLET BY MOUTH  DAILY 05/13/21  Yes Lesleigh Noe, MD  azaTHIOprine (IMURAN) 50 MG tablet Take 50 mg by mouth daily. 04/17/19  Yes [provider]  dorzolamide-timolol (COSOPT) 22.3-6.8 MG/ML ophthalmic solution Place 1 drop into both eyes 2 (two) times daily.   Yes [provider]  empagliflozin (JARDIANCE) 10 MG TABS tablet Take 1 tablet (10 mg  total) by mouth daily before breakfast. 02/23/22  Yes Elayne Snare, MD  fluticasone (FLONASE) 50 MCG/ACT nasal spray Place 2 sprays into both nostrils daily. 11/26/21  Yes Lesleigh Noe, MD  lisinopril (ZESTRIL) 40 MG tablet TAKE 1 TABLET BY MOUTH DAILY 02/08/22  Yes Lesleigh Noe, MD  metFORMIN (GLUCOPHAGE-XR) 500 MG 24 hr tablet Take 4 tablets (2,000 mg total) by mouth daily. 09/04/21  Yes Renato Shin, MD  Multiple Vitamin (MULTIVITAMIN) tablet Take 1 tablet by mouth daily.   Yes [provider]  solifenacin (VESICARE) 5 MG tablet Take 1 tablet (5 mg total) by mouth daily. 11/26/21  Yes Lesleigh Noe, MD  SUMAtriptan (IMITREX) 100 MG tablet TAKE 1 TABLET AT ONSET-MAY REPEAT IN 2 HOURS AS NEEDED- NO MORE THAN 2 IN 24 HOURS. Patient taking differently: Take 100 mg by mouth every 2 (two) hours as  needed for migraine. 11/27/19  Yes Lesleigh Noe, MD  tirzepatide [facility]) 5 MG/0.5ML Pen Inject 5 mg into the skin once a week. 02/23/22  Yes Elayne Snare, MD  triamterene-hydrochlorothiazide (MAXZIDE-25) 37.5-25 MG tablet Take 1 tablet by mouth daily. 02/18/22  Yes Lesleigh Noe, MD    No Known Allergies  Gynecologic History: Patient's last menstrual period was 12/29/2016.  Obstetric History: G3P1011  Social History   Socioeconomic History   Marital status: Divorced    Spouse name: Not on file   Number of children: 1   Years of education: masters   Highest education level: Not on file  Occupational History   Occupation: Development worker, international aid  Tobacco Use   Smoking status: Never   Smokeless tobacco: Never  Vaping Use   Vaping Use: Never used  Substance and Sexual Activity   Alcohol use: Yes    Alcohol/week: 2.0 standard drinks of alcohol    Types: 2 Standard drinks or equivalent per week    Comment: a few drinks a week, weekend   Drug use: No   Sexual activity: Yes    Partners: Male    Birth control/protection: Surgical, Condom    Comment: hysterctomy  Other Topics Concern    Not on file  Social History Narrative   11/27/19   From[place] has been in [place]   Living: with son - [name]   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[place], 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      Family: [name] - 1998, good relationship with parents   SO: Boyfriend - [name] - since 2020      Enjoys: walking with a friend, vacationing      Exercise: walking with friend   Diet: diabetic diet - tries to conscientious about diet      Safety   Seat belts: Yes    Guns: No   Safe in relationships: Yes          Social Determinants of Radio broadcast assistant Strain: Not on file  Food Insecurity: Not on file  Transportation Needs: Not on file  Physical Activity: Not on file  Stress: Not on file  Social Connections: Not on file  Intimate Partner Violence: Not on file    Family History  Problem Relation Age of Onset   Diabetes Mother    Thyroid disease Mother    Hypertension Mother    Diabetes Father    Colon cancer Father 60       2002   Hypertension Father    Stroke Father 58   Heart disease Father        stints   Diabetes Brother    Thyroid disease Brother    Breast cancer Maternal Aunt    Breast cancer Maternal Aunt    Breast cancer Maternal Aunt    Breast cancer Maternal Aunt    Breast cancer Maternal Aunt    Breast cancer Maternal Aunt    Breast cancer Cousin    Colon polyps Neg Hx    Esophageal cancer Neg Hx    Rectal cancer Neg Hx    Stomach cancer Neg Hx     Review of Systems  Constitutional:  Positive for weight loss. Negative for chills and fever.  HENT:  Negative for congestion and sore throat.   Eyes:  Positive for redness.  Respiratory:  Negative for cough and shortness of breath.   Cardiovascular:  Negative for chest pain and leg swelling.  Gastrointestinal:  Positive for abdominal pain. Negative for constipation, diarrhea, nausea and vomiting.       Poor appetite  Genitourinary:  Negative for dysuria.  Musculoskeletal:  Negative  for myalgias.  Skin: Negative.   Neurological:  Negative for dizziness and headaches.  Endo/Heme/Allergies:  Negative for polydipsia.  Psychiatric/Behavioral:  Negative for depression. The patient is not nervous/anxious.      Physical Exam BP 108/78   Pulse (!) 102   Temp 97.6 F (36.4 C) (Temporal)   Ht _0  (1.676 m)   Wt 152 lb 6 oz (69.1 kg)   LMP 12/29/2016   SpO2 99%   BMI 24.59 kg/m    BP Readings from Last 3 Encounters:  03/16/22 108/78  01/27/22 122/78  11/26/21 126/82      Physical Exam Constitutional:      General: She is not in acute distress.    Appearance: She is well-developed. She is not diaphoretic.     Comments: thin  HENT:     Head: Normocephalic and atraumatic.     Right Ear: External ear normal.     Left Ear: External ear normal.     Nose: Nose normal.  Eyes:     General: No scleral icterus.    Extraocular Movements: Extraocular movements intact.     Conjunctiva/sclera: Conjunctivae normal.     Comments: Uveitis   Cardiovascular:     Rate and Rhythm: Normal rate and regular rhythm.     Heart sounds: No murmur heard. Pulmonary:     Effort: Pulmonary effort is normal. No respiratory distress.     Breath sounds: Normal breath sounds. No wheezing.  Abdominal:     General: Bowel sounds are normal. There is no distension.     Palpations: Abdomen is soft. There is no mass.     Tenderness: There is no abdominal tenderness. There is no guarding or rebound.  Musculoskeletal:        General: Normal range of motion.     Cervical back: Neck supple.  Lymphadenopathy:     Cervical: No cervical adenopathy.  Skin:    General: Skin is warm and dry.     Capillary Refill: Capillary refill takes less than 2 seconds.  Neurological:     Mental Status: She is alert and oriented to person, place, and time.     Deep Tendon Reflexes: Reflexes normal.  Psychiatric:        Mood and Affect: Mood normal.        Behavior: Behavior normal.      Results:  PHQ-9:     09/04/2020    8:57 AM  Depression screen PHQ 2/9  Decreased Interest 0  Down, Depressed, Hopeless 0  PHQ - 2 Score 0      Assessment: 56 y.o. G60P1011 female here for routine annual physical examination.  Plan: Problem List Items Addressed This Visit       Cardiovascular and Mediastinum   Essential hypertension    Pressure slightly low, patient not feeling well.  Discussed decreasing lisinopril to 20 mg, continue hydrochlorothiazide.  Recheck potassium today.        Other   Weight loss    Anorexia and stomach cramping in the setting of increased dose of Mounjaro.  Advised stopping Mounjaro, monitor for symptom improvement or change. Labs today to assess for possible other causes.  If no improvement off of Mounjaro will consider additional work-up.  Overall abdominal exam was benign today.      Relevant Orders  Comprehensive metabolic panel   CBC with Differential   TSH   Other Visit Diagnoses     Annual physical exam    -  Primary       Screening: -- Blood pressure screen  low - decrease lisinopril to 20 mg -- cholesterol screening: not due for screening -- Weight screening: normal -- Diabetes Screening: not due for screening -- Nutrition: Encouraged healthy diet  The ASCVD Risk score (Arnett DK, et al., 2019) failed to calculate for the following reasons:   The valid total cholesterol range is 130 to 320 mg/dL  -- Statin therapy for Age 40-75 with CVD risk >7.5%  Psych -- Depression screening (PHQ-9):     09/04/2020    8:57 AM  Depression screen PHQ 2/9  Decreased Interest 0  Down, Depressed, Hopeless 0  PHQ - 2 Score 0      Safety -- tobacco screening: not using -- alcohol screening:  low-risk usage. -- no evidence of domestic violence or intimate partner violence.   Cancer Screening -- pap smear not collected per ASCCP guidelines -- family history of breast cancer screening: done. not at high risk. -- Mammogram -   up to date -- Colon cancer (age 56+)--  up to date  Immunizations Immunization History  Administered Date(s) Administered   Influenza, Seasonal, Injecte, Preservative Fre 05/31/2019   Influenza,inj,Quad PF,6+ Mos 07/18/2017, 05/07/2020   PFIZER(Purple Top)SARS-COV-2 Vaccination 09/08/2019, 09/28/2019, 09/23/2020   Pneumococcal Polysaccharide-23 07/18/2017   Tdap 05/07/2015    -- flu vaccine up to date -- TDAP q10 years up to date -- Shingles (age >50) not up to date - will discuss when feeling better -- PPSV-23 (19-64 with chronic disease or smoking) up to date -- Covid-19 Vaccine up to date   Encouraged healthy diet and exercise. Encouraged regular vision and dental care.    Lesleigh Noe, MD

## 2022-03-17 ENCOUNTER — Other Ambulatory Visit: Payer: Self-pay | Admitting: Family Medicine

## 2022-03-17 ENCOUNTER — Encounter: Payer: Self-pay | Admitting: Family Medicine

## 2022-03-17 DIAGNOSIS — N179 Acute kidney failure, unspecified: Secondary | ICD-10-CM

## 2022-03-17 NOTE — Telephone Encounter (Signed)
See mychart on labs  Pt will schedule repeat next week  She will hydrated Stop bp medications

## 2022-03-23 ENCOUNTER — Other Ambulatory Visit (INDEPENDENT_AMBULATORY_CARE_PROVIDER_SITE_OTHER): Payer: 59

## 2022-03-23 DIAGNOSIS — N179 Acute kidney failure, unspecified: Secondary | ICD-10-CM | POA: Diagnosis not present

## 2022-03-23 LAB — BASIC METABOLIC PANEL WITH GFR
BUN: 10 mg/dL (ref 6–23)
CO2: 28 meq/L (ref 19–32)
Calcium: 9.8 mg/dL (ref 8.4–10.5)
Chloride: 105 meq/L (ref 96–112)
Creatinine, Ser: 1.03 mg/dL (ref 0.40–1.20)
GFR: 61.91 mL/min
Glucose, Bld: 94 mg/dL (ref 70–99)
Potassium: 3.5 meq/L (ref 3.5–5.1)
Sodium: 140 meq/L (ref 135–145)

## 2022-03-26 ENCOUNTER — Encounter: Payer: Self-pay | Admitting: Endocrinology

## 2022-03-26 DIAGNOSIS — E119 Type 2 diabetes mellitus without complications: Secondary | ICD-10-CM

## 2022-04-02 MED ORDER — SITAGLIPTIN PHOSPHATE 100 MG PO TABS: 100.0000 mg | ORAL_TABLET | Freq: Every day | ORAL | 2 refills | Status: DC

## 2022-04-06 ENCOUNTER — Other Ambulatory Visit (HOSPITAL_COMMUNITY): Payer: Self-pay

## 2022-04-06 ENCOUNTER — Telehealth: Payer: Self-pay

## 2022-04-06 NOTE — Telephone Encounter (Signed)
Patient Advocate Encounter   Received notification from [employer] that prior authorization is required for Januvia '100MG'$   Submitted: 04/06/22 [place], CPhT Rx Patient Advocate Specialist Phone: [phone]

## 2022-04-07 NOTE — Telephone Encounter (Signed)
Patient Advocate Encounter  Received a fax from [id number] regarding Prior Authorization for [place].   Authorization has been DENIED due to criteria not met. This plan only covers this medication if: The patient has had an inadequate response, failed, or is unable to use [facility] of the following for a 3 (three) month trial: Luz Brazen, Tradjenta.   Clista Bernhardt, CPhT Rx Patient Advocate Specialist Phone: [phone]

## 2022-04-08 NOTE — Telephone Encounter (Signed)
Noted  

## 2022-04-25 NOTE — Progress Notes (Deleted)
Patient ID: Michelle Castillo, female   DOB: 1965/10/16, 56 y.o.   MRN: [id number]           Reason for Appointment: Type II Diabetes follow-up   History of Present Illness   Diagnosis date: 2005  Previous history:  She thinks her blood sugars were extremely high at the time of diagnosis but no records are available Also at that time she weighed over 200 pounds Has been managed mostly with metformin. Michelle Castillo was added in 7/22 Tradjenta was added in 10/22  Recent history:     Non-insulin hypoglycemic drugs: Metformin ER 2 g daily, Jardiance 10 mg daily, Tradjenta 5 mg daily             Side effects from medications: Nausea from Rybelsus  Current self management, blood sugar patterns and problems identified:  A1c is 6.9 and stable She has been on stable medications as above and appears to have better control with adding Tradjenta in 10/22 However she is concerned about using multiple medications and wants to reduce them Does not appear that she is checking her blood sugars much at all and only sporadically in the morning If she does have any readings after meals they are usually less than 200 She has maintained her weight about the same this year  Exercise: She walks 3/7 days, about 30 minutes with her with her dog  Diet management: She is avoiding all drinks with sugar and usually trying to cut back on portions      Monitors blood glucose: Once a day.    Glucometer: Accu-Chek.           Blood Glucose readings from recall  PRE-MEAL Fasting Lunch Dinner Bedtime Overall  Glucose range: 120-130   <200   Mean/median:          Hypoglycemia:  none                        Dietician visit: Most recent: At diagnosis  Weight control: Baseline weight 200+  Wt Readings from Last 3 Encounters:  03/16/22 152 lb 6 oz (69.1 kg)  01/27/22 165 lb (74.8 kg)  11/26/21 164 lb 12.8 oz (74.8 kg)            Diabetes labs:  Lab Results  Component Value Date   HGBA1C 6.9 (H)  01/15/2022   HGBA1C 6.8 (A) 09/04/2021   HGBA1C 7.9 (A) 06/05/2021   Lab Results  Component Value Date   MICROALBUR 1.3 01/15/2022   LDLCALC 72 01/15/2022   CREATININE 1.03 03/23/2022     Allergies as of 04/26/2022   No Known Allergies      Medication List        Accurate as of April 25, 2022  9:21 PM. If you have any questions, ask your nurse or doctor.          atorvastatin 10 MG tablet Commonly known as: LIPITOR TAKE 1 TABLET BY MOUTH  DAILY   azaTHIOprine 50 MG tablet Commonly known as: IMURAN Take 50 mg by mouth daily.   dorzolamide-timolol 22.3-6.8 MG/ML ophthalmic solution Commonly known as: COSOPT Place 1 drop into both eyes 2 (two) times daily.   empagliflozin 10 MG Tabs tablet Commonly known as: Jardiance Take 1 tablet (10 mg total) by mouth daily before breakfast.   fluticasone 50 MCG/ACT nasal spray Commonly known as: FLONASE Place 2 sprays into both nostrils daily.   lisinopril 40 MG tablet Commonly known as: ZESTRIL TAKE  1 TABLET BY MOUTH DAILY   metFORMIN 500 MG 24 hr tablet Commonly known as: GLUCOPHAGE-XR Take 4 tablets (2,000 mg total) by mouth daily.   multivitamin tablet Take 1 tablet by mouth daily.   sitaGLIPtin 100 MG tablet Commonly known as: Januvia Take 1 tablet (100 mg total) by mouth daily.   solifenacin 5 MG tablet Commonly known as: VESICARE Take 1 tablet (5 mg total) by mouth daily.   SUMAtriptan 100 MG tablet Commonly known as: IMITREX TAKE 1 TABLET AT ONSET-MAY REPEAT IN 2 HOURS AS NEEDED- NO MORE THAN 2 IN 24 HOURS. What changed:  how much to take how to take this when to take this reasons to take this additional instructions   tirzepatide 5 MG/0.5ML Pen Commonly known as: MOUNJARO Inject 5 mg into the skin once a week.   triamterene-hydrochlorothiazide 37.5-25 MG tablet Commonly known as: MAXZIDE-25 Take 1 tablet by mouth daily.        Allergies: No Known Allergies  Past Medical History:   Diagnosis Date   ANEMIA, IRON DEFICIENCY 2018   DIABETES MELLITUS, TYPE II    Headache(784.0)    HYPERCHOLESTEROLEMIA    HYPERTENSION    Hyperthyroidism    no meds, slightly elevated, MD will recheck in 3 months   Migraines    Peripheral focal chorioretinal inflammation of both eyes    URINARY INCONTINENCE     Past Surgical History:  Procedure Laterality Date   ABDOMINAL HYSTERECTOMY     APPENDECTOMY     BARTHOLIN CYST MARSUPIALIZATION Right 02/22/2017   Procedure: BARTHOLIN CYST MARSUPIALIZATION;  Surgeon: Salvadore Dom, MD;  Location: [facility];  Service: Gynecology;  Laterality: Right;   [place]   COLONOSCOPY  last 05/25/2013   CYSTOSCOPY N/A 02/22/2017   Procedure: CYSTOSCOPY;  Surgeon: Salvadore Dom, MD;  Location: [facility];  Service: Gynecology;  Laterality: N/A;   LAPAROSCOPIC LYSIS OF ADHESIONS N/A 02/22/2017   Procedure: EXTENSIVE LAPAROSCOPIC LYSIS OF ADHESIONS;  Surgeon: Salvadore Dom, MD;  Location: [facility];  Service: Gynecology;  Laterality: N/A;  Dr. Kieth Castillo    OOPHORECTOMY Left 02/22/2017   Procedure: OOPHORECTOMY;  Surgeon: Salvadore Dom, MD;  Location: [facility];  Service: Gynecology;  Laterality: Left;   TONSILLECTOMY AND ADENOIDECTOMY     TOTAL LAPAROSCOPIC HYSTERECTOMY WITH SALPINGECTOMY Bilateral 02/22/2017   Procedure: HYSTERECTOMY TOTAL LAPAROSCOPIC WITH SALPINGECTOMY;  Surgeon: Salvadore Dom, MD;  Location: [facility];  Service: Gynecology;  Laterality: Bilateral;    Family History  Problem Relation Age of Onset   Diabetes Mother    Thyroid disease Mother    Hypertension Mother    Diabetes Father    Colon cancer Father 60       2002   Hypertension Father    Stroke Father 27   Heart disease Father        stints   Diabetes Brother    Thyroid disease Brother    Breast cancer Maternal Aunt    Breast cancer Maternal Aunt    Breast cancer Maternal Aunt    Breast cancer Maternal Aunt    Breast cancer Maternal Aunt     Breast cancer Maternal Aunt    Breast cancer Cousin    Colon polyps Neg Hx    Esophageal cancer Neg Hx    Rectal cancer Neg Hx    Stomach cancer Neg Hx     Social History:  reports that she has never smoked. She has never used smokeless tobacco. She  reports current alcohol use of about 2.0 standard drinks of alcohol per week. She reports that she does not use drugs.  Review of Systems:  Last diabetic eye exam date:?  2021  Last foot exam date: 10/22 No history of numbness or tingling in her feet  Hypertension: Currently taking HCTZ and lisinopril 40 mg with good control  BP Readings from Last 3 Encounters:  03/16/22 108/78  01/27/22 122/78  11/26/21 126/82    Lipids: She has been prescribed 10 mg of atorvastatin by her PCP which she wants to stop this as her cholesterol improved    Lab Results  Component Value Date   CHOL 127 01/15/2022   CHOL 143 04/10/2020   CHOL 207 (H) 11/27/2019   Lab Results  Component Value Date   HDL 40.40 01/15/2022   HDL 55.50 04/10/2020   HDL 42.00 11/27/2019   Lab Results  Component Value Date   LDLCALC 72 01/15/2022   LDLCALC 76 04/10/2020   LDLCALC 143 (H) 11/27/2019   Lab Results  Component Value Date   TRIG 75.0 01/15/2022   TRIG 59.0 04/10/2020   TRIG 112.0 11/27/2019   Lab Results  Component Value Date   CHOLHDL 3 01/15/2022   CHOLHDL 3 04/10/2020   CHOLHDL 5 11/27/2019   No results found for: "LDLDIRECT"   Examination:   LMP 12/29/2016   There is no height or weight on file to calculate BMI.    ASSESSMENT/ PLAN:    Diabetes type 2 non-insulin-dependent:   Current regimen: Metformin, Tradjenta, Jardiance 10 mg  Blood glucose control is fairly good with A1c 6.9 However A1c has been as low as 6% in the past  Control was improved by adding Tradjenta last fall but she is concerned about its cost and availability at the pharmacy Also she wants to cut back on medications  Currently not monitoring blood sugars  much at all and only few fasting readings which appear to be fairly good including in the lab Overall doing fairly well with her diet and please meal planning However may be able to increase her exercise frequency  Since she was intolerant to Rybelsus orally she can try [facility] and if this is well-tolerated and effective may be able to stop Willey Blade also will need to be stopped with starting this   Discussed with the patient the action of GIP/GLP-1 drugs, the effects on pancreatic and liver function, effects on brain and stomach with improved satiety, slowing gastric emptying, improving satiety and reducing liver glucose output.  Discussed the effects on promoting weight loss. Explained possible side effects of MOUNJARO, most commonly nausea that usually improves over time; discussed safety information in package insert.  Demonstrated the medication injection device and injection technique to the patient.  Showed patient the injection sites for his medication To start with 2.5 mg dosage weekly for the first 4 injections and then increase the dose to 5 mg weekly  Patient brochure on Mounjaro and explained use of the available co-pay card, sample of the first month supply given She will call after the third injection to let us know if she is doing well and can go to 5 mg weekly  She will need a new glucose monitor and One Touch Verio will be prescribed  HYPERTENSION: Well-controlled However she needs to discuss her hypokalemia with PCP, likely can reduce her HCTZ  Lipid management: Discussed that statin drugs are useful for cardiovascular risk reduction regardless of cholesterol and she should continue this  There are no Patient Instructions on file for this visit.   Elayne Snare 04/25/2022, 9:21 PM

## 2022-04-26 ENCOUNTER — Ambulatory Visit: Payer: 59 | Admitting: Endocrinology

## 2022-04-30 ENCOUNTER — Other Ambulatory Visit: Payer: Self-pay | Admitting: Family Medicine

## 2022-04-30 DIAGNOSIS — I10 Essential (primary) hypertension: Secondary | ICD-10-CM

## 2022-04-30 DIAGNOSIS — E1169 Type 2 diabetes mellitus with other specified complication: Secondary | ICD-10-CM

## 2022-07-26 ENCOUNTER — Other Ambulatory Visit: Payer: Self-pay

## 2022-07-26 DIAGNOSIS — E119 Type 2 diabetes mellitus without complications: Secondary | ICD-10-CM

## 2022-07-26 MED ORDER — METFORMIN HCL ER 500 MG PO TB24: 2000.0000 mg | ORAL_TABLET | Freq: Every day | ORAL | 3 refills | Status: DC

## 2022-08-02 ENCOUNTER — Other Ambulatory Visit (INDEPENDENT_AMBULATORY_CARE_PROVIDER_SITE_OTHER): Payer: 59

## 2022-08-02 DIAGNOSIS — E119 Type 2 diabetes mellitus without complications: Secondary | ICD-10-CM

## 2022-08-02 LAB — BASIC METABOLIC PANEL
BUN: 11 mg/dL (ref 6–23)
CO2: 27 mEq/L (ref 19–32)
Calcium: 10.5 mg/dL (ref 8.4–10.5)
Chloride: 105 mEq/L (ref 96–112)
Creatinine, Ser: 0.9 mg/dL (ref 0.40–1.20)
GFR: 72.61 mL/min (ref >60.00)
Glucose, Bld: 93 mg/dL (ref 70–99)
Potassium: 3.5 mEq/L (ref 3.5–5.1)
Sodium: 139 mEq/L (ref 135–145)

## 2022-08-02 LAB — HEMOGLOBIN A1C: Hgb A1c MFr Bld: 7.3 % — ABNORMAL HIGH (ref 4.6–6.5)

## 2022-08-06 ENCOUNTER — Ambulatory Visit: Payer: 59 | Admitting: Endocrinology

## 2022-08-06 ENCOUNTER — Encounter: Payer: Self-pay | Admitting: Endocrinology

## 2022-08-06 VITALS — BP 110/74 | HR 103 | Ht 66.0 in | Wt 162.0 lb

## 2022-08-06 DIAGNOSIS — E1169 Type 2 diabetes mellitus with other specified complication: Secondary | ICD-10-CM

## 2022-08-06 DIAGNOSIS — I10 Essential (primary) hypertension: Secondary | ICD-10-CM

## 2022-08-06 DIAGNOSIS — E785 Hyperlipidemia, unspecified: Secondary | ICD-10-CM | POA: Diagnosis not present

## 2022-08-06 DIAGNOSIS — E119 Type 2 diabetes mellitus without complications: Secondary | ICD-10-CM

## 2022-08-06 MED ORDER — EMPAGLIFLOZIN 25 MG PO TABS: 25.0000 mg | ORAL_TABLET | Freq: Every day | ORAL | 2 refills | Status: DC

## 2022-08-06 NOTE — Progress Notes (Signed)
Patient ID: Michelle Castillo, female   DOB: 06-16-1966, 56 y.o.   MRN: [id number]           Reason for Appointment: Type II Diabetes follow-up   History of Present Illness   Diagnosis date: 2005  Previous history:  She thinks her blood sugars were extremely high at the time of diagnosis but no records are available Also at that time she weighed over 200 pounds Has been managed mostly with metformin. Michelle Castillo was added in 7/22 Tradjenta was added in 10/22  Recent history:     Non-insulin hypoglycemic drugs: Metformin ER 2 g daily, Jardiance 10 mg daily             Side effects from medications: Nausea from Rybelsus, vomiting from 5 mg Mounjaro  Current self management, blood sugar patterns and problems identified:  A1c is relatively higher at 7.3 compared to 6.9 She had done well with a sample of 2.5 mg Mounjaro but apparently had some nausea and vomiting with 5 mg and this was started by her PCP but no replacement made She was supposed to start Januvia because of Tradjenta being more expensive but she did not start this  Has not been seen in follow-up since May  Again did not bring her monitor for download Likely checking blood sugars only sporadically at home and they are modestly high However glucose was 93 before lunch  She has regained some of the weight she had lost with Mounjaro  Has not been on any higher doses of Jardiance in the past  Exercise: She walks 3/7 days, about 30 minutes with her with her dog  Diet management: She is avoiding all drinks with sugar and usually trying to cut back on portions      Monitors blood glucose: Once a day.    Glucometer: Accu-Chek.           Blood Glucose readings from recall Range 130-180 fasting or after meals Previously:  PRE-MEAL Fasting Lunch Dinner Bedtime Overall  Glucose range: 120-130   <200   Mean/median:          Hypoglycemia:  none                        Dietician visit: Most recent: At diagnosis  Weight  control: Baseline weight 200+  Wt Readings from Last 3 Encounters:  08/06/22 162 lb (73.5 kg)  03/16/22 152 lb 6 oz (69.1 kg)  01/27/22 165 lb (74.8 kg)            Diabetes labs:  Lab Results  Component Value Date   HGBA1C 7.3 (H) 08/02/2022   HGBA1C 6.9 (H) 01/15/2022   HGBA1C 6.8 (A) 09/04/2021   Lab Results  Component Value Date   MICROALBUR 1.3 01/15/2022   LDLCALC 72 01/15/2022   CREATININE 0.90 08/02/2022     Allergies as of 08/06/2022   No Known Allergies      Medication List        Accurate as of August 06, 2022  8:55 AM. If you have any questions, ask your nurse or doctor.          atorvastatin 10 MG tablet Commonly known as: LIPITOR TAKE 1 TABLET BY MOUTH  DAILY   azaTHIOprine 50 MG tablet Commonly known as: IMURAN Take 50 mg by mouth daily.   dorzolamide-timolol 2-0.5 % ophthalmic solution Commonly known as: COSOPT Place 1 drop into both eyes 2 (two) times daily.   empagliflozin  10 MG Tabs tablet Commonly known as: Jardiance Take 1 tablet (10 mg total) by mouth daily before breakfast.   fluticasone 50 MCG/ACT nasal spray Commonly known as: FLONASE Place 2 sprays into both nostrils daily.   lisinopril 40 MG tablet Commonly known as: ZESTRIL TAKE 1 TABLET BY MOUTH DAILY   metFORMIN 500 MG 24 hr tablet Commonly known as: GLUCOPHAGE-XR Take 4 tablets (2,000 mg total) by mouth daily.   multivitamin tablet Take 1 tablet by mouth daily.   sitaGLIPtin 100 MG tablet Commonly known as: Januvia Take 1 tablet (100 mg total) by mouth daily.   solifenacin 5 MG tablet Commonly known as: VESICARE Take 1 tablet (5 mg total) by mouth daily.   SUMAtriptan 100 MG tablet Commonly known as: IMITREX TAKE 1 TABLET AT ONSET-MAY REPEAT IN 2 HOURS AS NEEDED- NO MORE THAN 2 IN 24 HOURS. What changed:  how much to take how to take this when to take this reasons to take this additional instructions   tirzepatide 5 MG/0.5ML Pen Commonly known as:  MOUNJARO Inject 5 mg into the skin once a week.   triamterene-hydrochlorothiazide 37.5-25 MG tablet Commonly known as: MAXZIDE-25 Take 1 tablet by mouth daily.        Allergies: No Known Allergies  Past Medical History:  Diagnosis Date   ANEMIA, IRON DEFICIENCY 2018   DIABETES MELLITUS, TYPE II    Headache(784.0)    HYPERCHOLESTEROLEMIA    HYPERTENSION    Hyperthyroidism    no meds, slightly elevated, MD will recheck in 3 months   Migraines    Peripheral focal chorioretinal inflammation of both eyes    URINARY INCONTINENCE     Past Surgical History:  Procedure Laterality Date   ABDOMINAL HYSTERECTOMY     APPENDECTOMY     BARTHOLIN CYST MARSUPIALIZATION Right 02/22/2017   Procedure: BARTHOLIN CYST MARSUPIALIZATION;  Surgeon: Michelle Dom, MD;  Location: [facility];  Service: Gynecology;  Laterality: Right;   [place]   COLONOSCOPY  last 05/25/2013   CYSTOSCOPY N/A 02/22/2017   Procedure: CYSTOSCOPY;  Surgeon: Michelle Dom, MD;  Location: [facility];  Service: Gynecology;  Laterality: N/A;   LAPAROSCOPIC LYSIS OF ADHESIONS N/A 02/22/2017   Procedure: EXTENSIVE LAPAROSCOPIC LYSIS OF ADHESIONS;  Surgeon: Michelle Dom, MD;  Location: [facility];  Service: Gynecology;  Laterality: N/A;  Dr. Kieth Castillo    OOPHORECTOMY Left 02/22/2017   Procedure: OOPHORECTOMY;  Surgeon: Michelle Dom, MD;  Location: [facility];  Service: Gynecology;  Laterality: Left;   TONSILLECTOMY AND ADENOIDECTOMY     TOTAL LAPAROSCOPIC HYSTERECTOMY WITH SALPINGECTOMY Bilateral 02/22/2017   Procedure: HYSTERECTOMY TOTAL LAPAROSCOPIC WITH SALPINGECTOMY;  Surgeon: Michelle Dom, MD;  Location: [facility];  Service: Gynecology;  Laterality: Bilateral;    Family History  Problem Relation Age of Onset   Diabetes Mother    Thyroid disease Mother    Hypertension Mother    Diabetes Father    Colon cancer Father 60       2002   Hypertension Father    Stroke Father 26   Heart  disease Father        stints   Diabetes Brother    Thyroid disease Brother    Breast cancer Maternal Aunt    Breast cancer Maternal Aunt    Breast cancer Maternal Aunt    Breast cancer Maternal Aunt    Breast cancer Maternal Aunt    Breast cancer Maternal Aunt    Breast cancer Cousin  Colon polyps Neg Hx    Esophageal cancer Neg Hx    Rectal cancer Neg Hx    Stomach cancer Neg Hx     Social History:  reports that she has never smoked. She has never used smokeless tobacco. She reports current alcohol use of about 2.0 standard drinks of alcohol per week. She reports that she does not use drugs.  Review of Systems:  Last diabetic eye exam date:?  2021  Last foot exam date: 10/22 No history of numbness or tingling in her feet  Hypertension: Currently taking HCTZ unknown dose and lisinopril 40 mg with good control Her medication list is inaccurate as it states she is taking triamterene HCTZ 25 but she thinks she is taking a tiny pill Does have a meter at home  BP Readings from Last 3 Encounters:  08/06/22 110/74  03/16/22 108/78  01/27/22 122/78    Lipids: She has been prescribed 10 mg of atorvastatin by her PCP previously   Lab Results  Component Value Date   CHOL 127 01/15/2022   CHOL 143 04/10/2020   CHOL 207 (H) 11/27/2019   Lab Results  Component Value Date   HDL 40.40 01/15/2022   HDL 55.50 04/10/2020   HDL 42.00 11/27/2019   Lab Results  Component Value Date   LDLCALC 72 01/15/2022   LDLCALC 76 04/10/2020   LDLCALC 143 (H) 11/27/2019   Lab Results  Component Value Date   TRIG 75.0 01/15/2022   TRIG 59.0 04/10/2020   TRIG 112.0 11/27/2019   Lab Results  Component Value Date   CHOLHDL 3 01/15/2022   CHOLHDL 3 04/10/2020   CHOLHDL 5 11/27/2019   No results found for: "LDLDIRECT"   Examination:   BP 110/74   Pulse (!) 103   Ht '5\' 6"'$  (1.676 m)   Wt 162 lb (73.5 kg)   LMP 12/29/2016   SpO2 99%   BMI 26.15 kg/m   Body mass index is 26.15  kg/m.    ASSESSMENT/ PLAN:    Diabetes type 2 non-insulin-dependent:   Current regimen: Metformin, Jardiance 10 mg  Blood glucose control is worsening with A1c 7.3  However A1c has been as low as 6% in the past  She was intolerant to 5 mg Mounjaro and did not start Januvia as recommended Also not monitoring blood sugars consistently  Although she may benefit from nutritional referral she does not want to do so For now we will increase her Jardiance to 25 mg and see if we will get better control If the blood sugar control is not as good may consider 2.5 Mounjaro on the next visit  Needs more regular follow-up  HYPERTENSION: Well-controlled However because of increasing Jardiance and low normal potassium will stop her HCTZ for now  Check blood pressure regularly at home  Lipid management: Likely should continue with Lipitor and will check lipids on the next visit  There are no Patient Instructions on file for this visit.   Elayne Snare 08/06/2022, 8:55 AM

## 2022-08-06 NOTE — Patient Instructions (Addendum)
Stop HCTZ and double Jardiance  Check blood sugars on waking up 3 days a week  Also check blood sugars about 2 hours after meals and do this after different meals by rotation  Recommended blood sugar levels on waking up are 90-130 and about 2 hours after meal is 130-160  Please bring your blood sugar monitor to each visit, thank you

## 2022-09-03 ENCOUNTER — Telehealth: Payer: Self-pay

## 2022-09-03 NOTE — Telephone Encounter (Signed)
She needs OV. If unable to wait until next week urgent care recommended.

## 2022-09-03 NOTE — Telephone Encounter (Signed)
Pt notified and voiced understanding. Offered her to schedule appt with [place] for next week. However, pt stated she doesn't feel she can wait so plans to go to urgent care and will CB prn.

## 2022-09-03 NOTE — Telephone Encounter (Signed)
JJ pt calling to report external irritation starting on Saturday. Pt reports sxs just seem to be getting worse. Pt reports trying monistat for a couple days internally/externally. Pt also now noticing pimple like bumps on vulva. Pt also now reporting dysuria.   Hard to tell if having discharge due to using the cream and not noticing vaginal odor. Please advise.

## 2022-09-16 NOTE — Progress Notes (Signed)
57 y.o. G23P1011 Divorced Black or [place] American Not Hispanic or Latino female here for annual exam. H/O TLH/RS/LSO/LOA.  She has a new sexual partner, he had negative STD testing prior to them being sexually active. No dyspareunia.   She went to urgent care on 09/03/21 for burning of her vulva. She states that she had a pimple like bumps. She was told it could be HSV She states that they did labs that day and called her back and told her it was not HSV and they were not sure what it was.  The pain was awful, hurt to void.    H/O DM, HgbA1C from last month was 7.3.   She has occasional night sweats.   Patient's last menstrual period was 12/29/2016.          Sexually active: Yes.    The current method of family planning is status post hysterectomy.    Exercising: Yes.     Walking when she can  Smoker:  no  Health Maintenance: Pap:  07-01-16 Normal Neg HR HPV  History of abnormal Pap:  no MMG:  10/19/21 BI-rads 1 neg  BMD:   n/a Colonoscopy: 2021 sessile polyps, f/u 5 years TDaP:  2016 Gardasil: N/A   reports that she has never smoked. She has never used smokeless tobacco. She reports current alcohol use of about 2.0 standard drinks of alcohol per week. She reports that she does not use drugs. 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  Son is 62, living at home, working.   Past Medical History:  Diagnosis Date   ANEMIA, IRON DEFICIENCY 2018   DIABETES MELLITUS, TYPE II    Headache(784.0)    HYPERCHOLESTEROLEMIA    HYPERTENSION    Hyperthyroidism    no meds, slightly elevated, MD will recheck in 3 months   Migraines    Peripheral focal chorioretinal inflammation of both eyes    URINARY INCONTINENCE     Past Surgical History:  Procedure Laterality Date   APPENDECTOMY     BARTHOLIN CYST MARSUPIALIZATION Right 02/22/2017   Procedure: BARTHOLIN CYST MARSUPIALIZATION;  Surgeon: Salvadore Dom, MD;  Location: [facility];   Service: Gynecology;  Laterality: Right;   [place]   COLONOSCOPY  last 05/25/2013   CYSTOSCOPY N/A 02/22/2017   Procedure: CYSTOSCOPY;  Surgeon: Salvadore Dom, MD;  Location: [facility];  Service: Gynecology;  Laterality: N/A;   LAPAROSCOPIC LYSIS OF ADHESIONS N/A 02/22/2017   Procedure: EXTENSIVE LAPAROSCOPIC LYSIS OF ADHESIONS;  Surgeon: Salvadore Dom, MD;  Location: [facility];  Service: Gynecology;  Laterality: N/A;  Dr. Kieth Brightly    OOPHORECTOMY Left 02/22/2017   Procedure: OOPHORECTOMY;  Surgeon: Salvadore Dom, MD;  Location: [facility];  Service: Gynecology;  Laterality: Left;   TONSILLECTOMY AND ADENOIDECTOMY     TOTAL LAPAROSCOPIC HYSTERECTOMY WITH SALPINGECTOMY Bilateral 02/22/2017   Procedure: HYSTERECTOMY TOTAL LAPAROSCOPIC WITH SALPINGECTOMY;  Surgeon: Salvadore Dom, MD;  Location: [facility];  Service: Gynecology;  Laterality: Bilateral;    Current Outpatient Medications  Medication Sig Dispense Refill   atorvastatin (LIPITOR) 10 MG tablet TAKE 1 TABLET BY MOUTH  DAILY 90 tablet 3   azaTHIOprine (IMURAN) 50 MG tablet Take 50 mg by mouth daily.     dorzolamide-timolol (COSOPT) 22.3-6.8 MG/ML ophthalmic solution Place 1 drop into both eyes 2 (two) times daily.     empagliflozin (JARDIANCE) 25 MG TABS tablet Take 1 tablet (25 mg total)  by mouth daily before breakfast. 30 tablet 2   fluticasone (FLONASE) 50 MCG/ACT nasal spray Place 2 sprays into both nostrils daily. 16 g 6   lisinopril (ZESTRIL) 40 MG tablet TAKE 1 TABLET BY MOUTH DAILY 90 tablet 3   metFORMIN (GLUCOPHAGE-XR) 500 MG 24 hr tablet Take 4 tablets (2,000 mg total) by mouth daily. 360 tablet 3   Multiple Vitamin (MULTIVITAMIN) tablet Take 1 tablet by mouth daily.     sitaGLIPtin (JANUVIA) 100 MG tablet Take 1 tablet (100 mg total) by mouth daily. 30 tablet 2   SUMAtriptan (IMITREX) 100 MG tablet TAKE 1 TABLET AT ONSET-MAY REPEAT IN 2 HOURS AS NEEDED- NO MORE THAN 2 IN 24 HOURS. (Patient  taking differently: Take 100 mg by mouth every 2 (two) hours as needed for migraine.) 10 tablet 1   triamterene-hydrochlorothiazide (MAXZIDE-25) 37.5-25 MG tablet Take 1 tablet by mouth daily. 30 tablet 1   solifenacin (VESICARE) 5 MG tablet Take 1 tablet (5 mg total) by mouth daily. (Patient not taking: Reported on 09/21/2022) 30 tablet 0   No current facility-administered medications for this visit.    Family History  Problem Relation Age of Onset   Diabetes Mother    Thyroid disease Mother    Hypertension Mother    Diabetes Father    Colon cancer Father 60       2002   Hypertension Father    Stroke Father 44   Heart disease Father        stints   Diabetes Brother    Thyroid disease Brother    Breast cancer Maternal Aunt    Breast cancer Maternal Aunt    Breast cancer Maternal Aunt    Breast cancer Maternal Aunt    Breast cancer Maternal Aunt    Breast cancer Maternal Aunt    Breast cancer Cousin    Colon polyps Neg Hx    Esophageal cancer Neg Hx    Rectal cancer Neg Hx    Stomach cancer Neg Hx     Review of Systems  All other systems reviewed and are negative.   Exam:   BP 132/70   Pulse 66   Ht 5' 3.78" (1.62 m)   Wt 163 lb (73.9 kg)   LMP 12/29/2016   SpO2 100%   BMI 28.17 kg/m   Weight change: '@WEIGHTCHANGE'$ @ Height:   Height: 5' 3.78" (162 cm)  Ht Readings from Last 3 Encounters:  09/21/22 5' 3.78" (1.62 m)  08/06/22 '5\' 6"'$  (1.676 m)  03/16/22 '5\' 6"'$  (1.676 m)    General appearance: alert, cooperative and appears stated age Head: Normocephalic, without obvious abnormality, atraumatic Neck: no adenopathy, supple, symmetrical, trachea midline and thyroid normal to inspection and palpation Lungs: clear to auscultation bilaterally Cardiovascular: regular rate and rhythm Breasts: normal appearance, no masses or tenderness Abdomen: soft, non-tender; non distended,  no masses,  no organomegaly Extremities: extremities normal, atraumatic, no cyanosis or  edema Skin: Skin color, texture, turgor normal. No rashes or lesions Lymph nodes: Cervical, supraclavicular, and axillary nodes normal. No abnormal inguinal nodes palpated Neurologic: Grossly normal   Pelvic: External genitalia:  no lesions              Urethra:  normal appearing urethra with no masses, tenderness or lesions              Bartholins and Skenes: normal                 Vagina: normal appearing vagina with  an increase in watery, white vaginal d/c.              Cervix: absent               Bimanual Exam:  Uterus:  uterus absent              Adnexa: no mass, fullness, tenderness               Rectovaginal: Confirms               Anus:  normal sphincter tone, no lesions  Gae Dry, CMA chaperoned for the exam.  1. Well woman exam Discussed breast self exam Labs with primary Mammogram due next month, she will schedule Colonoscopy UTD  2. Screening examination for STD (sexually transmitted disease) She had what sounds like a primary genital HSV infection earlier this month.  - HSV(herpes simplex vrs) 1+2 ab-IgG - RPR - HIV Antibody (routine testing w rflx) - Hepatitis C antibody - SURESWAB CT/NG/T. vaginalis  3. Vaginal discharge She has felt slightly sore.  - WET PREP FOR [place], YEAST, CLUE  4. Bacterial vaginosis - metroNIDAZOLE (METROGEL) 0.75 % vaginal gel; Place 1 Applicatorful vaginally at bedtime. Use for 5 nights  Dispense: 70 g; Refill: 0  5. Family history of colon cancer - Ambulatory referral to Genetics  6. Family history of breast cancer - Ambulatory referral to [facility]

## 2022-09-21 ENCOUNTER — Encounter: Payer: Self-pay | Admitting: Obstetrics and Gynecology

## 2022-09-21 ENCOUNTER — Ambulatory Visit (INDEPENDENT_AMBULATORY_CARE_PROVIDER_SITE_OTHER): Payer: 59 | Admitting: Obstetrics and Gynecology

## 2022-09-21 VITALS — BP 132/70 | HR 66 | Ht 63.78 in | Wt 163.0 lb

## 2022-09-21 DIAGNOSIS — Z01419 Encounter for gynecological examination (general) (routine) without abnormal findings: Secondary | ICD-10-CM | POA: Diagnosis not present

## 2022-09-21 DIAGNOSIS — B9689 Other specified bacterial agents as the cause of diseases classified elsewhere: Secondary | ICD-10-CM

## 2022-09-21 DIAGNOSIS — Z113 Encounter for screening for infections with a predominantly sexual mode of transmission: Secondary | ICD-10-CM | POA: Diagnosis not present

## 2022-09-21 DIAGNOSIS — N760 Acute vaginitis: Secondary | ICD-10-CM

## 2022-09-21 DIAGNOSIS — N898 Other specified noninflammatory disorders of vagina: Secondary | ICD-10-CM | POA: Diagnosis not present

## 2022-09-21 DIAGNOSIS — Z803 Family history of malignant neoplasm of breast: Secondary | ICD-10-CM

## 2022-09-21 DIAGNOSIS — Z800 Family history of malignant neoplasm of digestive organs: Secondary | ICD-10-CM

## 2022-09-21 LAB — WET PREP FOR TRICH, YEAST, CLUE

## 2022-09-21 MED ORDER — METRONIDAZOLE 0.75 % VA GEL: 1.0000 | Freq: Every day | VAGINAL | 0 refills | Status: DC

## 2022-09-21 NOTE — Patient Instructions (Signed)

## 2022-09-22 LAB — SURESWAB CT/NG/T. VAGINALIS
C. trachomatis RNA, TMA: NOT DETECTED
N. gonorrhoeae RNA, TMA: NOT DETECTED
Trichomonas vaginalis RNA: NOT DETECTED

## 2022-09-23 LAB — T PALLIDUM AB: T Pallidum Abs: NEGATIVE

## 2022-09-23 LAB — HIV ANTIBODY (ROUTINE TESTING W REFLEX): HIV 1&2 Ab, 4th Generation: NONREACTIVE

## 2022-09-23 LAB — RPR TITER: RPR Titer: 1:1 {titer} — ABNORMAL HIGH

## 2022-09-23 LAB — HSV(HERPES SIMPLEX VRS) I + II AB-IGG
HAV 1 IGG,TYPE SPECIFIC AB: <0.9 index
HSV 2 IGG,TYPE SPECIFIC AB: 3.08 index — ABNORMAL HIGH

## 2022-09-23 LAB — HEPATITIS C ANTIBODY: Hepatitis C Ab: NONREACTIVE

## 2022-09-23 LAB — RPR: RPR Ser Ql: REACTIVE — AB

## 2022-09-24 ENCOUNTER — Telehealth: Payer: Self-pay

## 2022-09-24 MED ORDER — VALACYCLOVIR HCL 500 MG PO TABS: 500.0000 mg | ORAL_TABLET | Freq: Two times a day (BID) | ORAL | 1 refills | Status: AC

## 2022-09-24 NOTE — Telephone Encounter (Signed)
-----  Message from Salvadore Dom, MD sent at 09/23/2022  5:31 PM EST ----- Please let the patient know that her HSV serology did return + for HSV 2. This goes along with her description of a genital herpes outbreak earlier this month. See if she wants to come in to further discuss the diagnosis.  Please offer her a script for valtrex 500 mg, 1 po BID x 3 days prn outbreak. #30, 1 refill. The rest of her STD testing is negative. She did have a false + syphilis test, but does not have syphilis.

## 2022-09-24 NOTE — Telephone Encounter (Signed)
Patient was notified by phone of results and recommendations.  Patient verbalized understanding and agreed with plan.  RX sent.

## 2022-10-29 ENCOUNTER — Ambulatory Visit: Payer: 59 | Admitting: Endocrinology

## 2022-11-02 ENCOUNTER — Inpatient Hospital Stay: Payer: 59

## 2022-11-02 ENCOUNTER — Inpatient Hospital Stay: Payer: 59 | Admitting: Genetic Counselor

## 2022-11-02 ENCOUNTER — Other Ambulatory Visit: Payer: Self-pay | Admitting: Endocrinology

## 2022-11-16 ENCOUNTER — Encounter: Payer: Self-pay | Admitting: Endocrinology

## 2022-11-16 DIAGNOSIS — E119 Type 2 diabetes mellitus without complications: Secondary | ICD-10-CM

## 2022-11-16 MED ORDER — EMPAGLIFLOZIN 25 MG PO TABS: 25.0000 mg | ORAL_TABLET | Freq: Every day | ORAL | 3 refills | Status: DC

## 2022-12-01 ENCOUNTER — Encounter: Payer: Self-pay | Admitting: Obstetrics and Gynecology

## 2022-12-01 DIAGNOSIS — B009 Herpesviral infection, unspecified: Secondary | ICD-10-CM

## 2022-12-01 MED ORDER — EMPAGLIFLOZIN 25 MG PO TABS: 25.0000 mg | ORAL_TABLET | Freq: Every day | ORAL | 3 refills | Status: DC

## 2022-12-01 NOTE — Addendum Note (Signed)
Addended by: Lauralyn Primes on: 12/01/2022 06:05 PM   Modules accepted: Orders

## 2022-12-02 MED ORDER — VALACYCLOVIR HCL 500 MG PO TABS: ORAL_TABLET | ORAL | 1 refills | Status: DC

## 2022-12-02 NOTE — Telephone Encounter (Signed)
AEX 08/2022. Rx pend.

## 2022-12-16 ENCOUNTER — Other Ambulatory Visit: Payer: Self-pay | Admitting: Obstetrics and Gynecology

## 2022-12-16 DIAGNOSIS — Z0000 Encounter for general adult medical examination without abnormal findings: Secondary | ICD-10-CM

## 2022-12-17 NOTE — Progress Notes (Addendum)
REFERRING PROVIDER: Romualdo Bolk, MD [address] [place],  [place] [address]  PRIMARY PROVIDER:  Gweneth Dimitri, MD  PRIMARY REASON FOR VISIT:  Encounter Diagnoses  Name Primary?   Family history of breast cancer Yes   Family history of colon cancer    Family history of PALB2 gene mutation     HISTORY OF PRESENT ILLNESS:   Michelle Castillo, a 57 y.o. female, was seen for a [facility] cancer genetics consultation at the request of Dr. Oscar La due to a family history of breast and colon cancer.  Michelle Castillo presents to clinic today to discuss the possibility of a hereditary predisposition to cancer, to discuss genetic testing, and to further clarify her future cancer risks, as well as potential cancer risks for family members.   Michelle Castillo is a 57 y.o. female with no personal history of cancer.    CANCER HISTORY:  Oncology History   No history exists.    RISK FACTORS:  Mammogram within the last year: most recent 09/2021; category b density--scheduled for next month Number of breast biopsies: 0. Colonoscopy: yes;  most recent in 2021; repeat planned in 2026 . Hysterectomy: yes w/ unilateral salpingectomy (2018)  Ovaries intact: L oophorectomy in 2018 .  Menarche was at age 89.  First live birth at age 53.  Menopausal status: postmenopausal.  OCP use for more than 5 years.  HRT use: 0 years. Dermatology screening: no   Past Medical History:  Diagnosis Date   ANEMIA, IRON DEFICIENCY 2018   DIABETES MELLITUS, TYPE II    Headache(784.0)    HYPERCHOLESTEROLEMIA    HYPERTENSION    Hyperthyroidism    no meds, slightly elevated, MD will recheck in 3 months   Migraines    Peripheral focal chorioretinal inflammation of both eyes    URINARY INCONTINENCE     Past Surgical History:  Procedure Laterality Date   APPENDECTOMY     BARTHOLIN CYST MARSUPIALIZATION Right 02/22/2017   Procedure: BARTHOLIN CYST MARSUPIALIZATION;  Surgeon: Romualdo Bolk, MD;   Location: [facility];  Service: Gynecology;  Laterality: Right;   CESAREAN SECTION  1998   COLONOSCOPY  last 05/25/2013   CYSTOSCOPY N/A 02/22/2017   Procedure: CYSTOSCOPY;  Surgeon: Romualdo Bolk, MD;  Location: [facility];  Service: Gynecology;  Laterality: N/A;   LAPAROSCOPIC LYSIS OF ADHESIONS N/A 02/22/2017   Procedure: EXTENSIVE LAPAROSCOPIC LYSIS OF ADHESIONS;  Surgeon: Romualdo Bolk, MD;  Location: [facility];  Service: Gynecology;  Laterality: N/A;  Dr. Sheliah Hatch    OOPHORECTOMY Left 02/22/2017   Procedure: OOPHORECTOMY;  Surgeon: Romualdo Bolk, MD;  Location: [facility];  Service: Gynecology;  Laterality: Left;   TONSILLECTOMY AND ADENOIDECTOMY     TOTAL LAPAROSCOPIC HYSTERECTOMY WITH SALPINGECTOMY Bilateral 02/22/2017   Procedure: HYSTERECTOMY TOTAL LAPAROSCOPIC WITH SALPINGECTOMY;  Surgeon: Romualdo Bolk, MD;  Location: [facility];  Service: Gynecology;  Laterality: Bilateral;       FAMILY HISTORY:  We obtained a detailed, 4-generation family history.  Significant diagnoses are listed below: Family History  Problem Relation Age of Onset   Colon cancer Father 70   Breast cancer Maternal Aunt 8   Breast cancer Maternal Aunt        dx >50   Breast cancer Maternal Aunt        dx >50   Breast cancer Maternal Aunt        x3; first dx <50   Breast cancer Maternal Aunt 43   Breast  cancer Maternal Aunt        dx >50   Stomach cancer Paternal Uncle 16   Cancer Paternal Grandfather        unknown type; d. 93   Breast cancer Cousin 42       x2 mat female cousins   Breast cancer Cousin 37       mat first cousin once removed; PALB2 positive      Michelle Castillo's maternal first cousin once removed, Tiffany, had genetic testing and was found to have a PALB2 mutation.  No report was available for review today.  Other relatives are unavailable for genetic testing at this time.  Mother and maternal cousin (Tiffany's mother) not interested in genetic testing per patient.    There is no reported Ashkenazi Jewish ancestry. There is no known consanguinity.  GENETIC COUNSELING ASSESSMENT: Michelle Castillo is a 57 y.o. female with a family history which is somewhat suggestive of a hereditary cancer syndrome given the presence of premenopausal breast cancer in several generations in her maternal family. We, therefore, discussed and recommended the following at today's visit.   DISCUSSION: We discussed that 5 - 10% of cancer is hereditary, with most cases of hereditary breast cancer associated with mutations in BRCA1/2.  There are other genes that can be associated with hereditary breast cancer syndromes.  These include but are not limited to PALB2, ATM, and CHEK2.  Given her cousin has a mutation in the PALB2 gene, she has a ~6% chance of having the same family mutation in PALB2 gene.  We reviewed the breast, ovarian, and pancreatic cancer risks and management strategies associated with PALB2 mutations. We discussed that testing is beneficial for several reasons, including knowing about other cancer risks, identifying potential screening and risk-reduction options that may be appropriate, and to understanding if other family members could be at risk for cancer and allowing them to undergo genetic testing.  We reviewed the characteristics, features and inheritance patterns of hereditary cancer syndromes. We also discussed genetic testing, including the appropriate family members to test, the process of testing, insurance coverage and turn-around-time for results. We discussed the implications of a negative, positive, and variant of uncertain significant result. . We recommended Michelle Castillo pursue genetic testing for a panel that contains genes associated with breast cancer, colon cancer, and other cancers.  The CancerNext-Expanded gene panel offered by [facility] and includes sequencing, rearrangement, and RNA analysis for the following 71 genes:  AIP, ALK, APC, ATM, BAP1, BARD1,  BMPR1A, BRCA1, BRCA2, BRIP1, CDC73, CDH1, CDK4, CDKN1B, CDKN2A, CHEK2, DICER1, FH, FLCN, KIF1B, LZTR1,MAX, MEN1, MET, MLH1, MSH2, MSH6, MUTYH, NF1, NF2, NTHL1, PALB2, PHOX2B, PMS2, POT1, PRKAR1A, PTCH1, PTEN, RAD51C,RAD51D, RB1, RET, SDHA, SDHAF2, SDHB, SDHC, SDHD, SMAD4, SMARCA4, SMARCB1, SMARCE1, STK11, SUFU, TMEM127, TP53,TSC1, TSC2 and VHL (sequencing and deletion/duplication); AXIN2, CTNNA1, EGFR, EGLN1, HOXB13, KIT, MITF, MSH3, PDGFRA, POLD1 and POLE (sequencing only); EPCAM and GREM1 (deletion/duplication only).   Based on Michelle Castillo's family history of breast cancer, she meets medical criteria for panel-based genetic testing. Despite that she meets criteria, she may still have an out of pocket cost. We discussed that if her out of pocket cost for testing is over $100, the laboratory should contact them to discuss self-pay options and/or patient pay assistance programs.   We discussed the Genetic Information Non-Discrimination Act (GINA) of 2008, which helps protect individuals against genetic discrimination based on their genetic test results.  It impacts both health insurance and employment.  With health insurance, it  protects against genetic test results being used for increased premiums or policy termination. For employment, it protects against hiring, firing and promoting decisions based on genetic test results.  GINA does not apply to those in the [employer], those who work for companies with less than 15 employees, and new life insurance or long-term disability insurance policies.  Health status due to a cancer diagnosis is not protected under GINA.  PLAN: After considering the risks, benefits, and limitations, Michelle Castillo provided informed consent to pursue genetic testing and the blood sample was sent to [facility] for analysis of the CancerNext-Expanded +RNAinsight Panel. Results should be available within approximately 3 weeks' time, at which point they will be disclosed by telephone  to Michelle Castillo, as will any additional recommendations warranted by these results. Michelle Castillo will receive a summary of her genetic counseling visit and a copy of her results once available. This information will also be available in Epic.   Michelle Castillo questions were answered to her satisfaction today. Our contact information was provided should additional questions or concerns arise. Thank you for the referral and allowing [place] to share in the care of your patient.   Rito Lecomte M. Rennie Plowman, MS, [facility] Genetic Counselor [email or web address] (P) [phone]   The patient was seen for a total of 40 minutes in face-to-face genetic counseling.  The patient was seen alone.  Drs. Gunnar Bulla, and/or [facility] were available to discuss this case as needed.  _______________________________________________________________________ For Office Staff:  Number of people involved in session: 1 Was an Intern/ student involved with case: no

## 2022-12-20 ENCOUNTER — Inpatient Hospital Stay: Payer: 59

## 2022-12-20 ENCOUNTER — Other Ambulatory Visit: Payer: Self-pay

## 2022-12-20 ENCOUNTER — Inpatient Hospital Stay: Payer: 59 | Attending: Nurse Practitioner | Admitting: Genetic Counselor

## 2022-12-20 ENCOUNTER — Encounter: Payer: Self-pay | Admitting: Genetic Counselor

## 2022-12-20 DIAGNOSIS — Z800 Family history of malignant neoplasm of digestive organs: Secondary | ICD-10-CM

## 2022-12-20 DIAGNOSIS — Z8481 Family history of carrier of genetic disease: Secondary | ICD-10-CM

## 2022-12-20 DIAGNOSIS — Z803 Family history of malignant neoplasm of breast: Secondary | ICD-10-CM

## 2022-12-20 LAB — GENETIC SCREENING ORDER

## 2023-01-05 ENCOUNTER — Ambulatory Visit: Payer: 59 | Admitting: Endocrinology

## 2023-01-05 ENCOUNTER — Encounter: Payer: Self-pay | Admitting: Endocrinology

## 2023-01-05 VITALS — BP 134/82 | HR 90 | Ht 63.78 in | Wt 163.0 lb

## 2023-01-05 DIAGNOSIS — Z7984 Long term (current) use of oral hypoglycemic drugs: Secondary | ICD-10-CM

## 2023-01-05 DIAGNOSIS — E119 Type 2 diabetes mellitus without complications: Secondary | ICD-10-CM

## 2023-01-05 LAB — POCT GLYCOSYLATED HEMOGLOBIN (HGB A1C): Hemoglobin A1C: 6.6 % — AB (ref 4.0–5.6)

## 2023-01-05 MED ORDER — SYNJARDY 12.5-1000 MG PO TABS: 1.0000 | ORAL_TABLET | Freq: Two times a day (BID) | ORAL | 1 refills | Status: DC

## 2023-01-05 MED ORDER — LIFESCAN UNISTIK II LANCETS MISC: 0 refills | Status: DC

## 2023-01-05 MED ORDER — GLUCOSE BLOOD VI STRP: ORAL_STRIP | 12 refills | Status: DC

## 2023-01-05 NOTE — Patient Instructions (Signed)
Check blood sugars on waking up 2-3 days a week  Also check blood sugars about 2 hours after meals and do this after different meals by rotation  Recommended blood sugar levels on waking up are 90-130 and about 2 hours after meal is 130-160  Please bring your blood sugar monitor to each visit, thank you   

## 2023-01-05 NOTE — Progress Notes (Signed)
Patient ID: Michelle Castillo, female   DOB: 1966/01/16, 57 y.o.   MRN: [id number]           Reason for Appointment: Type II Diabetes follow-up   History of Present Illness   Diagnosis date: 2005  Previous history:  She thinks her blood sugars were extremely high at the time of diagnosis but no records are available Also at that time she weighed over 200 pounds Has been managed mostly with metformin. [place] Pepper was added in 7/22 Tradjenta was added in 10/22  Recent history:     Non-insulin hypoglycemic drugs: Metformin ER 2 g daily, Jardiance 25 mg daily          Side effects from medications: Nausea from Rybelsus, vomiting from 5 mg Mounjaro  Current self management, blood sugar patterns and problems identified:  A1c is relatively better at 6.6 compared to 7.3 She was told to increase her Jardiance on her last visit because of intolerance to GLP-1's and Mounjaro She is tolerating this well without any urinary difficulties or candidiasis She says she damaged her meter and has no readings to report Also was recommended Januvia previously but this was too expensive Her blood sugars appear to be fairly good by recall but checking only morning No recent labs available  Exercise: She is not walking recently because of knee pain, was doing about 30 minutes previously with her dog  Diet management: She is avoiding all drinks with sugar and usually trying to cut back on portions      Monitors blood glucose: Once a day.    Glucometer: One Touch Verio        Blood Glucose readings from recall  Am 120-130            Dietician visit: Most recent: At diagnosis  Weight control: Baseline weight 200+  Wt Readings from Last 3 Encounters:  01/05/23 163 lb (73.9 kg)  09/21/22 163 lb (73.9 kg)  08/06/22 162 lb (73.5 kg)            Diabetes labs:  Lab Results  Component Value Date   HGBA1C 6.6 (A) 01/05/2023   HGBA1C 7.3 (H) 08/02/2022   HGBA1C 6.9 (H) 01/15/2022   Lab Results   Component Value Date   MICROALBUR 1.3 01/15/2022   LDLCALC 72 01/15/2022   CREATININE 0.90 08/02/2022     Allergies as of 01/05/2023   No Known Allergies      Medication List        Accurate as of Jan 05, 2023 10:03 AM. If you have any questions, ask your nurse or doctor.          STOP taking these medications    empagliflozin 25 MG Tabs tablet Commonly known as: Jardiance Stopped by: Reather Littler, MD   metFORMIN 500 MG 24 hr tablet Commonly known as: GLUCOPHAGE-XR Stopped by: Reather Littler, MD   sitaGLIPtin 100 MG tablet Commonly known as: Januvia Stopped by: Reather Littler, MD       TAKE these medications    atorvastatin 10 MG tablet Commonly known as: LIPITOR TAKE 1 TABLET BY MOUTH  DAILY   azaTHIOprine 50 MG tablet Commonly known as: IMURAN Take 50 mg by mouth daily.   dorzolamide-timolol 2-0.5 % ophthalmic solution Commonly known as: COSOPT Place 1 drop into both eyes 2 (two) times daily.   fluticasone 50 MCG/ACT nasal spray Commonly known as: FLONASE Place 2 sprays into both nostrils daily.   lisinopril 40 MG tablet Commonly known as: ZESTRIL  TAKE 1 TABLET BY MOUTH DAILY   metroNIDAZOLE 0.75 % vaginal gel Commonly known as: METROGEL Place 1 Applicatorful vaginally at bedtime. Use for 5 nights   multivitamin tablet Take 1 tablet by mouth daily.   solifenacin 5 MG tablet Commonly known as: VESICARE Take 1 tablet (5 mg total) by mouth daily.   SUMAtriptan 100 MG tablet Commonly known as: IMITREX TAKE 1 TABLET AT ONSET-MAY REPEAT IN 2 HOURS AS NEEDED- NO MORE THAN 2 IN 24 HOURS. What changed:  how much to take how to take this when to take this reasons to take this additional instructions   Synjardy 12.12-998 MG Tabs Generic drug: Empagliflozin-metFORMIN HCl Take 1 tablet by mouth 2 (two) times daily. Started by: Reather Littler, MD   triamterene-hydrochlorothiazide 37.5-25 MG tablet Commonly known as: MAXZIDE-25 Take 1 tablet by mouth  daily.   valACYclovir 500 MG tablet Commonly known as: VALTREX 1 tab po BID x 3 days as needed.        Allergies: No Known Allergies  Past Medical History:  Diagnosis Date   ANEMIA, IRON DEFICIENCY 2018   DIABETES MELLITUS, TYPE II    Headache(784.0)    HYPERCHOLESTEROLEMIA    HYPERTENSION    Hyperthyroidism    no meds, slightly elevated, MD will recheck in 3 months   Migraines    Peripheral focal chorioretinal inflammation of both eyes    URINARY INCONTINENCE     Past Surgical History:  Procedure Laterality Date   APPENDECTOMY     BARTHOLIN CYST MARSUPIALIZATION Right 02/22/2017   Procedure: BARTHOLIN CYST MARSUPIALIZATION;  Surgeon: Romualdo Bolk, MD;  Location: [facility];  Service: Gynecology;  Laterality: Right;   CESAREAN SECTION  1998   COLONOSCOPY  last 05/25/2013   CYSTOSCOPY N/A 02/22/2017   Procedure: CYSTOSCOPY;  Surgeon: Romualdo Bolk, MD;  Location: [facility];  Service: Gynecology;  Laterality: N/A;   LAPAROSCOPIC LYSIS OF ADHESIONS N/A 02/22/2017   Procedure: EXTENSIVE LAPAROSCOPIC LYSIS OF ADHESIONS;  Surgeon: Romualdo Bolk, MD;  Location: [facility];  Service: Gynecology;  Laterality: N/A;  Dr. Sheliah Hatch    OOPHORECTOMY Left 02/22/2017   Procedure: OOPHORECTOMY;  Surgeon: Romualdo Bolk, MD;  Location: [facility];  Service: Gynecology;  Laterality: Left;   TONSILLECTOMY AND ADENOIDECTOMY     TOTAL LAPAROSCOPIC HYSTERECTOMY WITH SALPINGECTOMY Bilateral 02/22/2017   Procedure: HYSTERECTOMY TOTAL LAPAROSCOPIC WITH SALPINGECTOMY;  Surgeon: Romualdo Bolk, MD;  Location: [facility];  Service: Gynecology;  Laterality: Bilateral;    Family History  Problem Relation Age of Onset   Diabetes Mother    Thyroid disease Mother    Hypertension Mother    Diabetes Father    Colon cancer Father 65       2002   Hypertension Father    Stroke Father 84   Heart disease Father        stints   Diabetes Brother    Thyroid disease Brother    Breast  cancer Maternal Aunt 67   Breast cancer Maternal Aunt        dx >50   Breast cancer Maternal Aunt        dx >50   Breast cancer Maternal Aunt        x3; first dx <50   Breast cancer Maternal Aunt 6   Breast cancer Maternal Aunt        dx >50   Stomach cancer Paternal Uncle 68   Cancer Paternal Grandfather  unknown type; d. 75   Breast cancer Cousin 50       x2 mat female cousins   Breast cancer Cousin 37       mat first cousin once removed; PALB2 positive   Colon polyps Neg Hx    Esophageal cancer Neg Hx    Rectal cancer Neg Hx     Social History:  reports that she has never smoked. She has never used smokeless tobacco. She reports current alcohol use of about 2.0 standard drinks of alcohol per week. She reports that she does not use drugs.  Review of Systems:  Last diabetic eye exam date:?  2021  Last foot exam date: 5/24 No history of numbness or tingling in her feet  Hypertension: Treatment regimen includes lisinopril 40 mg with good control  Does have a meter at home  BP Readings from Last 3 Encounters:  01/05/23 134/82  09/21/22 132/70  08/06/22 110/74    Lipids: She has been prescribed 10 mg of atorvastatin by her PCP previously   Lab Results  Component Value Date   CHOL 127 01/15/2022   CHOL 143 04/10/2020   CHOL 207 (H) 11/27/2019   Lab Results  Component Value Date   HDL 40.40 01/15/2022   HDL 55.50 04/10/2020   HDL 42.00 11/27/2019   Lab Results  Component Value Date   LDLCALC 72 01/15/2022   LDLCALC 76 04/10/2020   LDLCALC 143 (H) 11/27/2019   Lab Results  Component Value Date   TRIG 75.0 01/15/2022   TRIG 59.0 04/10/2020   TRIG 112.0 11/27/2019   Lab Results  Component Value Date   CHOLHDL 3 01/15/2022   CHOLHDL 3 04/10/2020   CHOLHDL 5 11/27/2019   No results found for: "LDLDIRECT"   Examination:   BP 134/82 (BP Location: Left Arm, Patient Position: Sitting, Cuff Size: Large)   Pulse 90   Ht 5' 3.78" (1.62 m)   Wt  163 lb (73.9 kg)   LMP 12/29/2016   SpO2 98%   BMI 28.17 kg/m   Body mass index is 28.17 kg/m.   Diabetic Foot Exam - Simple   Simple Foot Form Diabetic Foot exam was performed with the following findings: Yes   Visual Inspection No deformities, no ulcerations, no other skin breakdown bilaterally: Yes Sensation Testing Intact to touch and monofilament testing bilaterally: Yes Pulse Check Posterior Tibialis and Dorsalis pulse intact bilaterally: Yes Comments      ASSESSMENT/ PLAN:    Diabetes type 2 non-insulin-dependent:   Current regimen: Metformin, Jardiance 25 mg  Blood glucose control is improved with increasing Jardiance and is now below 7  However unclear what her blood sugar patterns are especially after meals Recently has not been able to exercise but able to maintain her weight lately  She will continue the same medications but will continue and switch to [place] twice a day which is covered  Also new prescription for One Touch Verio test strips has been sent, currently her strips are out of date  Needs more regular follow-up   Patient Instructions  Check blood sugars on waking up 2-3 days a week  Also check blood sugars about 2 hours after meals and do this after different meals by rotation  Recommended blood sugar levels on waking up are 90-130 and about 2 hours after meal is 130-160  Please bring your blood sugar monitor to each visit, thank you    Reather Littler 01/05/2023, 10:03 AM

## 2023-01-06 ENCOUNTER — Ambulatory Visit: Payer: 59 | Admitting: Family

## 2023-01-06 ENCOUNTER — Encounter: Payer: Self-pay | Admitting: Family

## 2023-01-06 VITALS — BP 132/86 | HR 84 | Temp 97.8°F | Ht 63.0 in | Wt 165.2 lb

## 2023-01-06 DIAGNOSIS — B009 Herpesviral infection, unspecified: Secondary | ICD-10-CM | POA: Diagnosis not present

## 2023-01-06 DIAGNOSIS — H30033 Focal chorioretinal inflammation, peripheral, bilateral: Secondary | ICD-10-CM | POA: Diagnosis not present

## 2023-01-06 DIAGNOSIS — Z85038 Personal history of other malignant neoplasm of large intestine: Secondary | ICD-10-CM | POA: Diagnosis not present

## 2023-01-06 DIAGNOSIS — E1169 Type 2 diabetes mellitus with other specified complication: Secondary | ICD-10-CM

## 2023-01-06 DIAGNOSIS — H209 Unspecified iridocyclitis: Secondary | ICD-10-CM | POA: Diagnosis not present

## 2023-01-06 DIAGNOSIS — I10 Essential (primary) hypertension: Secondary | ICD-10-CM

## 2023-01-06 DIAGNOSIS — E785 Hyperlipidemia, unspecified: Secondary | ICD-10-CM

## 2023-01-06 MED ORDER — VALACYCLOVIR HCL 500 MG PO TABS: ORAL_TABLET | ORAL | 0 refills | Status: DC

## 2023-01-06 NOTE — Progress Notes (Signed)
Established Patient Office Visit  Subjective:  Patient ID: Michelle Castillo, female    DOB: 1966/02/15  Age: 57 y.o. MRN: [id number]  CC:  Chief Complaint  Patient presents with   Establish Care    TOC from Dr Selena Batten    HPI Michelle Castillo is here for a transition of care visit.  Prior provider was:Dr. Gweneth Dimitri   Pt is without acute concerns.   chronic concerns:  Dm2: sees Dr. Lucianne Muss (endo) on synjardy. Was just seen yesterday by endo states A1C is 6.6 .   Uveitis: has seen by three ophthalmologist specialists, when this occurs her eyes are really red and hurt. She no longer sees ophthalmologist because she 'has given up' due to frustration without relief. Has only had one episode this one year. Does see ophthalmologist regularly (not speciality)  HLD: on atorvastatin 10 mg once nightly tolerating well.  Lab Results  Component Value Date   CHOL 127 01/15/2022   HDL 40.40 01/15/2022   LDLCALC 72 01/15/2022   TRIG 75.0 01/15/2022   CHOLHDL 3 01/15/2022   HTN: on maxide 37.5 -25 mg once daily.   [facility] breast cancer, recently tested for BRCA, and awaiting test results.   Past Medical History:  Diagnosis Date   ANEMIA, IRON DEFICIENCY 2018   DIABETES MELLITUS, TYPE II    Headache(784.0)    HYPERCHOLESTEROLEMIA    HYPERTENSION    Hyperthyroidism    no meds, slightly elevated, MD will recheck in 3 months   Migraines    Peripheral focal chorioretinal inflammation of both eyes    URINARY INCONTINENCE     Past Surgical History:  Procedure Laterality Date   APPENDECTOMY     BARTHOLIN CYST MARSUPIALIZATION Right 02/22/2017   Procedure: BARTHOLIN CYST MARSUPIALIZATION;  Surgeon: Romualdo Bolk, MD;  Location: [facility];  Service: Gynecology;  Laterality: Right;   CESAREAN SECTION  1998   COLONOSCOPY  last 05/25/2013   CYSTOSCOPY N/A 02/22/2017   Procedure: CYSTOSCOPY;  Surgeon: Romualdo Bolk, MD;  Location: [facility];  Service: Gynecology;  Laterality: N/A;    LAPAROSCOPIC LYSIS OF ADHESIONS N/A 02/22/2017   Procedure: EXTENSIVE LAPAROSCOPIC LYSIS OF ADHESIONS;  Surgeon: Romualdo Bolk, MD;  Location: [facility];  Service: Gynecology;  Laterality: N/A;  Dr. Sheliah Hatch    OOPHORECTOMY Left 02/22/2017   Procedure: OOPHORECTOMY;  Surgeon: Romualdo Bolk, MD;  Location: [facility];  Service: Gynecology;  Laterality: Left;   TONSILLECTOMY AND ADENOIDECTOMY     TOTAL LAPAROSCOPIC HYSTERECTOMY WITH SALPINGECTOMY Bilateral 02/22/2017   Procedure: HYSTERECTOMY TOTAL LAPAROSCOPIC WITH SALPINGECTOMY;  Surgeon: Romualdo Bolk, MD;  Location: [facility];  Service: Gynecology;  Laterality: Bilateral;    Family History  Problem Relation Age of Onset   Diabetes Mother    Thyroid disease Mother    Hypertension Mother    Diabetes Father    Colon cancer Father 24       2002   Hypertension Father    Stroke Father 37   Heart disease Father        stents   Dementia Father    Diabetes Brother    Thyroid disease Brother    Cancer Paternal Grandfather        unknown type; d. 66   Breast cancer Maternal Aunt 50   Breast cancer Maternal Aunt        dx >50   Breast cancer Maternal Aunt        dx >50   Breast  cancer Maternal Aunt        x3; first dx <50   Breast cancer Maternal Aunt 38   Breast cancer Maternal Aunt        dx >50   Stomach cancer Paternal Uncle 40   Breast cancer Cousin 60       x2 mat female cousins   Breast cancer Cousin 37       mat first cousin once removed; PALB2 positive   Colon polyps Neg Hx    Esophageal cancer Neg Hx    Rectal cancer Neg Hx     Social History   Socioeconomic History   Marital status: Divorced    Spouse name: Not on file   Number of children: 1   Years of education: masters   Highest education level: Not on file  Occupational History   Occupation: Engineer, maintenance (IT): [employer]  Tobacco Use   Smoking status: Never   Smokeless tobacco: Never  Vaping Use   Vaping Use: Never used   Substance and Sexual Activity   Alcohol use: Yes    Alcohol/week: 2.0 standard drinks of alcohol    Types: 2 Standard drinks or equivalent per week    Comment: a few drinks a week, weekend   Drug use: No   Sexual activity: Yes    Partners: Male    Birth control/protection: Surgical, Condom    Comment: hysterctomy  Other Topics Concern   Not on file  Social History Narrative   11/27/19   From: has been in [place]   Living: with son - [name]   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[place], 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      Family: [name] - 1998, good relationship with parents   SO: Boyfriend - [name] - since 2020      Enjoys: walking with a friend, vacationing      Exercise: walking with friend   Diet: diabetic diet - tries to conscientious about diet      Safety   Seat belts: Yes    Guns: No   Safe in relationships: Yes          Social Determinants of Corporate investment banker Strain: Not on file  Food Insecurity: Not on file  Transportation Needs: Not on file  Physical Activity: Not on file  Stress: Not on file  Social Connections: Not on file  Intimate Partner Violence: Not on file    Outpatient Medications Prior to Visit  Medication Sig Dispense Refill   atorvastatin (LIPITOR) 10 MG tablet TAKE 1 TABLET BY MOUTH  DAILY 90 tablet 3   dorzolamide-timolol (COSOPT) 22.3-6.8 MG/ML ophthalmic solution Place 1 drop into both eyes 2 (two) times daily.     Empagliflozin-metFORMIN HCl (SYNJARDY) 12.12-998 MG TABS Take 1 tablet by mouth 2 (two) times daily. 180 tablet 1   glucose blood test strip Use up to twice a day to check blood sugars 50 each 12   lisinopril (ZESTRIL) 40 MG tablet TAKE 1 TABLET BY MOUTH DAILY 90 tablet 3   Multiple Vitamin (MULTIVITAMIN) tablet Take 1 tablet by mouth daily.     SUMAtriptan (IMITREX) 100 MG tablet TAKE 1 TABLET AT ONSET-MAY REPEAT IN 2 HOURS AS NEEDED- NO MORE THAN 2 IN 24 HOURS. (Patient taking differently: Take  100 mg by mouth every 2 (two) hours as needed for migraine.) 10 tablet 1   triamterene-hydrochlorothiazide (MAXZIDE-25) 37.5-25 MG tablet Take 1 tablet by  mouth daily. 30 tablet 1   azaTHIOprine (IMURAN) 50 MG tablet Take 50 mg by mouth daily.     LifeScan Unist[place] II Lancets MISC Used to check blood sugars 50 each 0   valACYclovir (VALTREX) 500 MG tablet 1 tab po BID x 3 days as needed. 30 tablet 1   fluticasone (FLONASE) 50 MCG/ACT nasal spray Place 2 sprays into both nostrils daily. 16 g 6   metroNIDAZOLE (METROGEL) 0.75 % vaginal gel Place 1 Applicatorful vaginally at bedtime. Use for 5 nights 70 g 0   solifenacin (VESICARE) 5 MG tablet Take 1 tablet (5 mg total) by mouth daily. 30 tablet 0   No facility-administered medications prior to visit.    Allergies  Allergen Reactions   Mounjaro [Tirzepatide] Nausea And Vomiting    Stomach pains  Lost ten pounds   Prednisone Other (See Comments)    Increased agitation     ROS: Pertinent symptoms negative unless otherwise noted in HPI     Objective:    Physical Exam Vitals reviewed.  Constitutional:      Appearance: Normal appearance.  Eyes:     General:        Right eye: No discharge.        Left eye: No discharge.     Conjunctiva/sclera: Conjunctivae normal.  Cardiovascular:     Rate and Rhythm: Normal rate and regular rhythm.  Pulmonary:     Effort: Pulmonary effort is normal. No respiratory distress.     Breath sounds: Normal breath sounds.  Musculoskeletal:        General: Normal range of motion.     Cervical back: Normal range of motion.  Neurological:     General: No focal deficit present.     Mental Status: She is alert and oriented to person, place, and time. Mental status is at baseline.  Psychiatric:        Mood and Affect: Mood normal.        Behavior: Behavior normal.        Thought Content: Thought content normal.        Judgment: Judgment normal.       BP 132/86 (BP Location: Right Arm)   Pulse 84    Temp 97.8 F (36.6 C) (Temporal)   Ht 5\' 3"  (1.6 m)   Wt 165 lb 3.2 oz (74.9 kg)   LMP 12/29/2016   SpO2 98%   BMI 29.26 kg/m  Wt Readings from Last 3 Encounters:  01/06/23 165 lb 3.2 oz (74.9 kg)  01/05/23 163 lb (73.9 kg)  09/21/22 163 lb (73.9 kg)     Health Maintenance Due  Topic Date Due   PAP SMEAR-Modifier  07/01/2021   Diabetic kidney evaluation - Urine ACR  01/16/2023    There are no preventive care reminders to display for this patient.  Lab Results  Component Value Date   TSH 0.50 03/16/2022   Lab Results  Component Value Date   WBC 5.7 03/16/2022   HGB 13.6 03/16/2022   HCT 40.5 03/16/2022   MCV 82.7 03/16/2022   PLT 351.0 03/16/2022   Lab Results  Component Value Date   NA 139 08/02/2022   K 3.5 08/02/2022   CHLORIDE 107 06/01/2017   CO2 27 08/02/2022   GLUCOSE 93 08/02/2022   BUN 11 08/02/2022   CREATININE 0.90 08/02/2022   BILITOT 0.6 03/16/2022   ALKPHOS 53 03/16/2022   AST 14 03/16/2022   ALT 14 03/16/2022   PROT 7.7 03/16/2022  ALBUMIN 5.0 03/16/2022   CALCIUM 10.5 08/02/2022   ANIONGAP 10 10/12/2020   EGFR >90 06/01/2017   GFR 72.61 08/02/2022   Lab Results  Component Value Date   CHOL 127 01/15/2022   Lab Results  Component Value Date   HDL 40.40 01/15/2022   Lab Results  Component Value Date   LDLCALC 72 01/15/2022   Lab Results  Component Value Date   TRIG 75.0 01/15/2022   Lab Results  Component Value Date   CHOLHDL 3 01/15/2022   Lab Results  Component Value Date   HGBA1C 6.6 (A) 01/05/2023      Assessment & Plan:   Uveitis  History of colon cancer  Herpes simplex virus (HSV) infection -     valACYclovir HCl; Take one tablet po bid for five days prn outbreak  Dispense: 90 tablet; Refill: 0  HSV-2 (herpes simplex virus 2) infection Assessment & Plan: Valtrex refilled.    Peripheral focal chorioretinal inflammation of both eyes Assessment & Plan: Advised to continue to f/u with ophthalmologist  as scheduled.   Type 2 diabetes mellitus with other specified complication, without long-term current use of insulin (HCC) Assessment & Plan: Followed by endo continue medication as prescribed. F/u with endo as scheduled.   Hyperlipidemia associated with type 2 diabetes mellitus (HCC) Assessment & Plan: Continue atorvastatin 10 mg nightly.  Continue to work on low cholesterol diet.    Essential hypertension Assessment & Plan: Stable Continue maxide daily      Meds ordered this encounter  Medications   valACYclovir (VALTREX) 500 MG tablet    Sig: Take one tablet po bid for five days prn outbreak    Dispense:  90 tablet    Refill:  0    Order Specific Question:   Supervising Provider    Answer:   Ermalene Searing, AMY E [[id number]]    Follow-up: Return in about 3 months (around 04/08/2023) for f/u CPE.    Mort Sawyers, FNP

## 2023-01-07 ENCOUNTER — Other Ambulatory Visit: Payer: Self-pay

## 2023-01-07 DIAGNOSIS — E119 Type 2 diabetes mellitus without complications: Secondary | ICD-10-CM

## 2023-01-07 MED ORDER — ONETOUCH DELICA LANCETS 30G MISC
1.0000 | Freq: Two times a day (BID) | 11 refills | Status: DC
Start: 2023-01-07 — End: 2023-10-06

## 2023-01-10 ENCOUNTER — Telehealth: Payer: Self-pay | Admitting: Genetic Counselor

## 2023-01-10 ENCOUNTER — Encounter: Payer: Self-pay | Admitting: Genetic Counselor

## 2023-01-10 DIAGNOSIS — Z1379 Encounter for other screening for genetic and chromosomal anomalies: Secondary | ICD-10-CM | POA: Insufficient documentation

## 2023-01-10 NOTE — Telephone Encounter (Signed)
Contacted patient in attempt to disclose results of genetic testing.  LVM with contact information requesting a call back.  

## 2023-01-10 NOTE — Assessment & Plan Note (Signed)
Stable Continue maxide daily

## 2023-01-10 NOTE — Assessment & Plan Note (Signed)
Continue atorvastatin 10 mg nightly.  Continue to work on low cholesterol diet.

## 2023-01-10 NOTE — Assessment & Plan Note (Signed)
Advised to continue to f/u with ophthalmologist as scheduled.

## 2023-01-10 NOTE — Assessment & Plan Note (Signed)
Followed by endo continue medication as prescribed. F/u with endo as scheduled.

## 2023-01-10 NOTE — Assessment & Plan Note (Signed)
Valtrex refilled.

## 2023-01-11 ENCOUNTER — Ambulatory Visit: Payer: Self-pay | Admitting: Genetic Counselor

## 2023-01-11 ENCOUNTER — Telehealth: Payer: Self-pay | Admitting: Genetic Counselor

## 2023-01-11 DIAGNOSIS — Z1379 Encounter for other screening for genetic and chromosomal anomalies: Secondary | ICD-10-CM

## 2023-01-11 DIAGNOSIS — Z803 Family history of malignant neoplasm of breast: Secondary | ICD-10-CM

## 2023-01-11 DIAGNOSIS — Z800 Family history of malignant neoplasm of digestive organs: Secondary | ICD-10-CM

## 2023-01-11 NOTE — Progress Notes (Signed)
HPI:   Ms. Revard was previously seen in the [facility] due to a family history of breast cancer and concerns regarding a hereditary predisposition to cancer.    Ms. Popa recent genetic test results were disclosed to her by telephone. These results and recommendations are discussed in more detail below.  CANCER HISTORY:  Ms. Bachmeier is a 57 y.o. female with no personal history of cancer.      FAMILY HISTORY:  We obtained a detailed, 4-generation family history.  Significant diagnoses are listed below:      Family History  Problem Relation Age of Onset   Colon cancer Father 64   Breast cancer Maternal Aunt 5   Breast cancer Maternal Aunt          dx >50   Breast cancer Maternal Aunt          dx >50   Breast cancer Maternal Aunt          x3; first dx <50   Breast cancer Maternal Aunt 4   Breast cancer Maternal Aunt          dx >50   Stomach cancer Paternal Uncle 24   Cancer Paternal Grandfather          unknown type; d. 7   Breast cancer Cousin 6        x2 mat female cousins   Breast cancer Cousin 37        mat first cousin once removed; PALB2 positive        Ms. Crisco's maternal first cousin once removed, Tiffany, had genetic testing and was found to have a PALB2 mutation.  No report was available for review today.  Other relatives are unavailable for genetic testing at this time.  Mother and maternal cousin (Tiffany's mother) not interested in genetic testing per patient.    There is no reported Ashkenazi Jewish ancestry. There is no known consanguinity.  GENETIC TEST RESULTS:  The [facility] CancerNext-Expanded +RNAinsight Panel found no pathogenic mutations.   The CancerNext-Expanded gene panel offered by [facility] and includes sequencing, rearrangement, and RNA analysis for the following 71 genes:  AIP, ALK, APC, ATM, BAP1, BARD1, BMPR1A, BRCA1, BRCA2, BRIP1, CDC73, CDH1, CDK4, CDKN1B, CDKN2A, CHEK2, DICER1, FH, FLCN, KIF1B, LZTR1,MAX,  MEN1, MET, MLH1, MSH2, MSH6, MUTYH, NF1, NF2, NTHL1, PALB2, PHOX2B, PMS2, POT1, PRKAR1A, PTCH1, PTEN, RAD51C,RAD51D, RB1, RET, SDHA, SDHAF2, SDHB, SDHC, SDHD, SMAD4, SMARCA4, SMARCB1, SMARCE1, STK11, SUFU, TMEM127, TP53,TSC1, TSC2 and VHL (sequencing and deletion/duplication); AXIN2, CTNNA1, EGFR, EGLN1, HOXB13, KIT, MITF, MSH3, PDGFRA, POLD1 and POLE (sequencing only); EPCAM and GREM1 (deletion/duplication only).   The test report has been scanned into EPIC and is located under the Molecular Pathology section of the Results Review tab.  A portion of the result report is included below for reference. Genetic testing reported out on December 26, 2022.     Even though a pathogenic variant was not identified, possible explanations for the cancer in the family may include: There may be no hereditary risk for cancer in the family. The cancers in Ms. Yard's family may be sporadic/familial or due to other genetic and environmental factors. There may be a gene mutation in one of these genes that current testing methods cannot detect but that chance is small. There could be another gene that has not yet been discovered, or that we have not yet tested, that is responsible for the cancer diagnoses in the family.  It is also possible there is a  hereditary cause for the cancer in the family that Ms. Butzin did not inherit (such as PALB2 or another gene).   Therefore, it is important to remain in touch with cancer genetics in the future so that we can continue to offer Ms. Dainty the most up to date genetic testing.    ADDITIONAL GENETIC TESTING:   Ms. Chadwick genetic testing was fairly extensive.  If there are additional relevant genes identified to increase cancer risk that can be analyzed in the future, we would be happy to discuss and coordinate this testing at that time.      CANCER SCREENING RECOMMENDATIONS:  Ms. Neeman test result is considered negative (normal).  This means that we have not  identified a hereditary cause for her family history of cancer at this time.   An individual's cancer risk and medical management are not determined by genetic test results alone. Overall cancer risk assessment incorporates additional factors, including personal medical history, family history, and any available genetic information that may result in a personalized plan for cancer prevention and surveillance. Therefore, it is recommended she continue to follow the cancer management and screening guidelines provided by her primary healthcare provider.  Based on the reported personal and family history, specific cancer screenings for Ms. Patrice Paradise include: Given her father's history of colon cancer, colonoscopies at least every 5 years or as recommended by her GI provider.   Her lifetime risk for breast cancer based on Tyrer-Cuzick risk model and reported personal/family history is 12.6%.  This is elevated compared to the general population (8.6% for her age group) but not in the 'high risk for breast cancer' category per NCCN guidelines (>20%).  This risk estimate can change over time and may be repeated to reflect new information in her personal or family history in the future.  She should continue breast screening as recommended by her PCP/GYN providers.        RECOMMENDATIONS FOR FAMILY MEMBERS:   Since she did not inherit a identifiable mutation in a cancer predisposition gene included on this panel, her son could not have inherited a known mutation from her in one of these genes. Individuals in this family might be at some increased risk of developing cancer, over the general population risk, due to the family history of cancer.  Individuals in the family should notify their providers of the family history of cancer. We recommend women in this family have a yearly mammogram beginning at age 72, or 100 years younger than the earliest onset of cancer, an annual clinical breast exam, and perform  monthly breast self-exams.  Risk models that take into account family history and hormonal history may be helpful in determining appropriate breast cancer screening options for family members.  First degree relatives of those with colon cancer should receive colonoscopies beginning at age 29, or 10 years prior to the earliest diagnosis of colon cancer in the family, and receive colonoscopies at least every 5 years, or as recommended by their gastroenterologist.   Other members of the family may still carry a pathogenic variant in one of these genes that Ms. Georgi did not inherit. Based on the family history, we recommend her maternal aunt, who was diagnosed with breast cancer, have genetic counseling and testing. Ms. Cabada can let us know if we can be of any assistance in coordinating genetic counseling and/or testing for these family members.     FOLLOW-UP:  Cancer genetics is a rapidly advancing field and it is  possible that new genetic tests will be appropriate for her and/or her family members in the future. We encourage Ms. Konopka to remain in contact with cancer genetics, so we can update her personal and family histories and let her know of advances in cancer genetics that may benefit this family.   Our contact number was provided.. she knows she is welcome to call us at anytime with additional questions or concerns.   Ilea Hilton M. Rennie Plowman, MS, [facility] Genetic Counselor [email or web address] (P) [phone]

## 2023-01-11 NOTE — Telephone Encounter (Signed)
Disclosed negative hereditary cancer genetic testing.

## 2023-01-17 ENCOUNTER — Ambulatory Visit
Admission: RE | Admit: 2023-01-17 | Discharge: 2023-01-17 | Disposition: A | Discharge status: Home / Self Care | Payer: 59 | Source: Ambulatory Visit | Attending: Obstetrics and Gynecology | Admitting: Obstetrics and Gynecology

## 2023-01-17 DIAGNOSIS — Z0000 Encounter for general adult medical examination without abnormal findings: Secondary | ICD-10-CM

## 2023-01-23 ENCOUNTER — Other Ambulatory Visit: Payer: Self-pay | Admitting: Endocrinology

## 2023-01-23 DIAGNOSIS — E119 Type 2 diabetes mellitus without complications: Secondary | ICD-10-CM

## 2023-01-23 MED ORDER — SYNJARDY 12.5-1000 MG PO TABS: 1.0000 | ORAL_TABLET | Freq: Two times a day (BID) | ORAL | 1 refills | Status: DC

## 2023-02-15 ENCOUNTER — Other Ambulatory Visit: Payer: Self-pay

## 2023-02-15 ENCOUNTER — Encounter: Payer: Self-pay | Admitting: Family

## 2023-02-15 DIAGNOSIS — I10 Essential (primary) hypertension: Secondary | ICD-10-CM

## 2023-02-15 MED ORDER — LISINOPRIL 40 MG PO TABS: 40.0000 mg | ORAL_TABLET | Freq: Every day | ORAL | 3 refills | Status: DC

## 2023-02-15 NOTE — Telephone Encounter (Signed)
Given last by Dr. Selena Batten ok to refill for patient. Lisinopril 40 mg. Has follow up 8/13

## 2023-02-16 MED ORDER — TRIAMTERENE-HCTZ 37.5-25 MG PO TABS
1.0000 | ORAL_TABLET | Freq: Every day | ORAL | 3 refills | Status: DC
Start: 2023-02-16 — End: 2023-02-16

## 2023-02-16 MED ORDER — TRIAMTERENE-HCTZ 37.5-25 MG PO TABS
1.0000 | ORAL_TABLET | Freq: Every day | ORAL | 3 refills | Status: DC
Start: 2023-02-16 — End: 2023-02-22

## 2023-02-16 NOTE — Addendum Note (Signed)
Addended by: Mort Sawyers on: 02/16/2023 07:56 AM   Modules accepted: Orders

## 2023-02-16 NOTE — Telephone Encounter (Signed)
Has not been give by you in the past ok to refill as requested by patient?

## 2023-02-17 ENCOUNTER — Ambulatory Visit: Payer: 59 | Admitting: Internal Medicine

## 2023-02-17 ENCOUNTER — Encounter: Payer: Self-pay | Admitting: Internal Medicine

## 2023-02-17 VITALS — BP 134/80 | HR 98 | Temp 98.4°F | Ht 63.0 in | Wt 160.0 lb

## 2023-02-17 DIAGNOSIS — R051 Acute cough: Secondary | ICD-10-CM

## 2023-02-17 DIAGNOSIS — U071 COVID-19: Secondary | ICD-10-CM | POA: Insufficient documentation

## 2023-02-17 LAB — POC COVID19 BINAXNOW: SARS Coronavirus 2 Ag: POSITIVE — AB

## 2023-02-17 MED ORDER — PAXLOVID (300/100) 20 X 150 MG & 10 X 100MG PO TBPK: 3.0000 | ORAL_TABLET | Freq: Two times a day (BID) | ORAL | 0 refills | Status: DC

## 2023-02-17 NOTE — Progress Notes (Signed)
Subjective:    Patient ID: Michelle Castillo, female    DOB: 05/24/1966, 57 y.o.   MRN: [id number]  HPI Here due to respiratory illness  Started 3 days ago Sore throat--then congestion in head (worst thing is nasal congestion) Appetite is off---weight down 5# Having some trouble breathing--due to nasal congestion No ear pain No sig headache--aleve did help Also took mucinex Only a little cough  Was out of country and came back 3 days---[place] Cousin with her--she got sick 3 days earlier  Current Outpatient Medications on File Prior to Visit  Medication Sig Dispense Refill   atorvastatin (LIPITOR) 10 MG tablet TAKE 1 TABLET BY MOUTH  DAILY 90 tablet 3   dorzolamide-timolol (COSOPT) 22.3-6.8 MG/ML ophthalmic solution Place 1 drop into both eyes 2 (two) times daily.     Empagliflozin-metFORMIN HCl (SYNJARDY) 12.12-998 MG TABS Take 1 tablet by mouth 2 (two) times daily. 180 tablet 1   glucose blood test strip Use up to twice a day to check blood sugars 50 each 12   lisinopril (ZESTRIL) 40 MG tablet Take 1 tablet (40 mg total) by mouth daily. 90 tablet 3   Multiple Vitamin (MULTIVITAMIN) tablet Take 1 tablet by mouth daily.     OneTouch Delica Lancets 30G MISC 1 each by Does not apply route in the morning and at bedtime. 100 each 11   SUMAtriptan (IMITREX) 100 MG tablet TAKE 1 TABLET AT ONSET-MAY REPEAT IN 2 HOURS AS NEEDED- NO MORE THAN 2 IN 24 HOURS. (Patient taking differently: Take 100 mg by mouth every 2 (two) hours as needed for migraine.) 10 tablet 1   triamterene-hydrochlorothiazide (MAXZIDE-25) 37.5-25 MG tablet Take 1 tablet by mouth daily. 90 tablet 3   valACYclovir (VALTREX) 500 MG tablet Take one tablet po bid for five days prn outbreak 90 tablet 0   No current facility-administered medications on file prior to visit.    Allergies  Allergen Reactions   Mounjaro [Tirzepatide] Nausea And Vomiting    Stomach pains  Lost ten pounds   Prednisone Other (See Comments)     Increased agitation     Past Medical History:  Diagnosis Date   ANEMIA, IRON DEFICIENCY 2018   DIABETES MELLITUS, TYPE II    Headache(784.0)    HYPERCHOLESTEROLEMIA    HYPERTENSION    Hyperthyroidism    no meds, slightly elevated, MD will recheck in 3 months   Migraines    Peripheral focal chorioretinal inflammation of both eyes    URINARY INCONTINENCE     Past Surgical History:  Procedure Laterality Date   APPENDECTOMY     BARTHOLIN CYST MARSUPIALIZATION Right 02/22/2017   Procedure: BARTHOLIN CYST MARSUPIALIZATION;  Surgeon: Romualdo Bolk, MD;  Location: [facility];  Service: Gynecology;  Laterality: Right;   CESAREAN SECTION  1998   COLONOSCOPY  last 05/25/2013   CYSTOSCOPY N/A 02/22/2017   Procedure: CYSTOSCOPY;  Surgeon: Romualdo Bolk, MD;  Location: [facility];  Service: Gynecology;  Laterality: N/A;   LAPAROSCOPIC LYSIS OF ADHESIONS N/A 02/22/2017   Procedure: EXTENSIVE LAPAROSCOPIC LYSIS OF ADHESIONS;  Surgeon: Romualdo Bolk, MD;  Location: [facility];  Service: Gynecology;  Laterality: N/A;  Dr. Sheliah Hatch    OOPHORECTOMY Left 02/22/2017   Procedure: OOPHORECTOMY;  Surgeon: Romualdo Bolk, MD;  Location: [facility];  Service: Gynecology;  Laterality: Left;   TONSILLECTOMY AND ADENOIDECTOMY     TOTAL LAPAROSCOPIC HYSTERECTOMY WITH SALPINGECTOMY Bilateral 02/22/2017   Procedure: HYSTERECTOMY TOTAL LAPAROSCOPIC WITH SALPINGECTOMY;  Surgeon: Oscar La,  Craig Guess, MD;  Location: [facility];  Service: Gynecology;  Laterality: Bilateral;    Family History  Problem Relation Age of Onset   Diabetes Mother    Thyroid disease Mother    Hypertension Mother    Diabetes Father    Colon cancer Father 52       2002   Hypertension Father    Stroke Father 71   Heart disease Father        stents   Dementia Father    Diabetes Brother    Thyroid disease Brother    Cancer Paternal Grandfather        unknown type; d. 22   Breast cancer Maternal Aunt 50   Breast cancer  Maternal Aunt        dx >50   Breast cancer Maternal Aunt        dx >50   Breast cancer Maternal Aunt        x3; first dx <50   Breast cancer Maternal Aunt 52   Breast cancer Maternal Aunt        dx >50   Stomach cancer Paternal Uncle 34   Breast cancer Cousin 72       x2 mat female cousins   Breast cancer Cousin 37       mat first cousin once removed; PALB2 positive   Colon polyps Neg Hx    Esophageal cancer Neg Hx    Rectal cancer Neg Hx     Social History   Socioeconomic History   Marital status: Divorced    Spouse name: Not on file   Number of children: 1   Years of education: masters   Highest education level: Not on file  Occupational History   Occupation: Engineer, maintenance (IT): [employer]  Tobacco Use   Smoking status: Never   Smokeless tobacco: Never  Vaping Use   Vaping Use: Never used  Substance and Sexual Activity   Alcohol use: Yes    Alcohol/week: 2.0 standard drinks of alcohol    Types: 2 Standard drinks or equivalent per week    Comment: a few drinks a week, weekend   Drug use: No   Sexual activity: Yes    Partners: Male    Birth control/protection: Surgical, Condom    Comment: hysterctomy  Other Topics Concern   Not on file  Social History Narrative   11/27/19   From: has been in [place] years[place]   Living: with son - [name]   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[place], 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      Family: [name] - 1998, good relationship with parents   SO: Boyfriend - [name] - since 2020      Enjoys: walking with a friend, vacationing      Exercise: walking with friend   Diet: diabetic diet - tries to conscientious about diet      Safety   Seat belts: Yes    Guns: No   Safe in relationships: Yes          Social Determinants of Corporate investment banker Strain: Not on file  Food Insecurity: Not on file  Transportation Needs: Not on file  Physical Activity: Not on file  Stress: Not on file  Social  Connections: Not on file  Intimate Partner Violence: Not on file   Review of Systems Has lost smell Nausea--and decreased appetite. No vomiting    Objective:   Physical Exam  Constitutional:      General: She is not in acute distress.    Appearance: Normal appearance.  HENT:     Head:     Comments: No sinus tenderness    Right Ear: Tympanic membrane and ear canal normal.     Left Ear: Tympanic membrane and ear canal normal.     Mouth/Throat:     Pharynx: No oropharyngeal exudate or posterior oropharyngeal erythema.  Pulmonary:     Effort: Pulmonary effort is normal.     Breath sounds: Normal breath sounds. No wheezing or rales.  Musculoskeletal:     Cervical back: Neck supple.  Lymphadenopathy:     Cervical: No cervical adenopathy.  Neurological:     Mental Status: She is alert.            Assessment & Plan:

## 2023-02-17 NOTE — Assessment & Plan Note (Signed)
3 days in Discussed options with antiviral--she would like to try if affordable Mostly sinus symptoms--if worse next week--would try empiric antibiotic Analgesics Quarantine till next week--then mask ER if sig SOB

## 2023-02-22 ENCOUNTER — Other Ambulatory Visit: Payer: Self-pay | Admitting: Family

## 2023-02-22 ENCOUNTER — Encounter: Payer: Self-pay | Admitting: Family

## 2023-02-22 DIAGNOSIS — I10 Essential (primary) hypertension: Secondary | ICD-10-CM

## 2023-02-22 MED ORDER — TRIAMTERENE-HCTZ 37.5-25 MG PO TABS: 1.0000 | ORAL_TABLET | Freq: Every day | ORAL | 3 refills | Status: DC

## 2023-02-22 MED ORDER — TRIAMTERENE-HCTZ 37.5-25 MG PO TABS
1.0000 | ORAL_TABLET | Freq: Every day | ORAL | 3 refills | Status: DC
Start: 2023-02-22 — End: 2023-05-09

## 2023-02-25 ENCOUNTER — Telehealth: Payer: Self-pay | Admitting: Family

## 2023-02-25 NOTE — Telephone Encounter (Signed)
Pt called stating yesterday, 6/27, while at the [place], she had some extreme cramping in her abdominal area along with sweats. [facility] because the amount of pain she was in. Pt states she eventually calmed down, due to the pain leaving. Pt stated as of now, the pain comes & goes, however she's no longer sweating. Pt requested to schedule an appt, offered her [facility]'s 12:30 slot for today, 6/28. Pt declined, requested [facility]'s next available slot. Scheduled pt for Mon, 7/1, per her request. Transferred pt to access nurse. Call back # [phone]

## 2023-02-25 NOTE — Telephone Encounter (Signed)
I spoke with pt and she is not having pain now and plans on resting this weekend; pt said she was not going to drive and will drink fluids. UC & ED precautions given and pt voiced understanding. Pt already has appt with T Dugal FNP 02/28/23 at 12:40. Sending note to Hayden Pedro FNP who is out of office as FYI and Audria Nine NP who is in office.Marland Kitchen/

## 2023-02-25 NOTE — Telephone Encounter (Signed)
Noted. Agree with ED and UC precautions along with being evaluated in office

## 2023-02-28 ENCOUNTER — Encounter: Payer: Self-pay | Admitting: Family

## 2023-02-28 ENCOUNTER — Ambulatory Visit: Payer: 59 | Admitting: Family

## 2023-02-28 ENCOUNTER — Telehealth: Payer: Self-pay | Admitting: Family

## 2023-02-28 VITALS — BP 122/84 | HR 103 | Temp 97.2°F | Ht 63.0 in | Wt 158.0 lb

## 2023-02-28 DIAGNOSIS — R918 Other nonspecific abnormal finding of lung field: Secondary | ICD-10-CM | POA: Insufficient documentation

## 2023-02-28 DIAGNOSIS — D1803 Hemangioma of intra-abdominal structures: Secondary | ICD-10-CM | POA: Insufficient documentation

## 2023-02-28 DIAGNOSIS — R109 Unspecified abdominal pain: Secondary | ICD-10-CM | POA: Diagnosis not present

## 2023-02-28 DIAGNOSIS — R0989 Other specified symptoms and signs involving the circulatory and respiratory systems: Secondary | ICD-10-CM | POA: Insufficient documentation

## 2023-02-28 DIAGNOSIS — R1032 Left lower quadrant pain: Secondary | ICD-10-CM | POA: Insufficient documentation

## 2023-02-28 MED ORDER — DICYCLOMINE HCL 10 MG PO CAPS
10.0000 mg | ORAL_CAPSULE | Freq: Three times a day (TID) | ORAL | 0 refills | Status: DC
Start: 2023-02-28 — End: 2023-09-06

## 2023-02-28 NOTE — Telephone Encounter (Signed)
Noted  

## 2023-02-28 NOTE — Assessment & Plan Note (Signed)
R/o AAA  Stat CT abd pelvis w contrast pending results. Discussed red flag symptoms with pt  R/f low however with diabetes, hypertension, no h/o smoking

## 2023-02-28 NOTE — Progress Notes (Signed)
Established Patient Office Visit  Subjective:   Patient ID: Michelle Castillo, female    DOB: 02/15/1966  Age: 57 y.o. MRN: [id number]  CC:  Chief Complaint  Patient presents with   GI Problem    06/27, had an episode that came on suddenly of severe abdominal pain, lightheadedness, excessive sweating. The next day 06/28 she experienced abdominal cramping and diarrhea, 06/29 and 06/30 she had bad abdominal cramps. This morning BM was normal. She denies nausea/vomiting. She states she has had a decrease in appetite since these episodes started.     HPI: Michelle Castillo is a 57 y.o. female presenting on 02/28/2023 for GI Problem (06/27, had an episode that came on suddenly of severe abdominal pain, lightheadedness, excessive sweating. The next day 06/28 she experienced abdominal cramping and diarrhea, 06/29 and 06/30 she had bad abdominal cramps. This morning BM was normal. She denies nausea/vomiting. She states she has had a decrease in appetite since these episodes started. )  Five days ago started with abdominal pain. She felt like she might have to go and have a bowel movement however on the way to the bathroom the pain increased and she had to stop because of abdominal pain. When she went into the bathroom, she peed and then started to feel really hot, was sweating profusely, and was with sob. After about ten minutes the pain eased up a little bit.   The next two days had the symptoms again but to a lesser degree.  She also reports she felt the abdominal cramps ongoing, including yesterday. She states happens one time in the day, not any regular time frame. She states episode typically lasts for about ten minutes.  No constipation. Did have one episode of diarrhea. Has since had regular bowel movements.  No nausea or vomiting.   She describes the sensation in her lower abdomen with intense cramping.  When she would eat she felt bloated, and the food made her uncomfortable, decreased appetite.   No blood in stool.   Did have covid 6/20 was treated with paxlovid.  Wt Readings from Last 3 Encounters:  02/28/23 158 lb (71.7 kg)  02/17/23 160 lb (72.6 kg)  01/06/23 165 lb 3.2 oz (74.9 kg)        ROS: Negative unless specifically indicated above in HPI.   Relevant past medical history reviewed and updated as indicated.   Allergies and medications reviewed and updated.   Current Outpatient Medications:    atorvastatin (LIPITOR) 10 MG tablet, TAKE 1 TABLET BY MOUTH  DAILY, Disp: 90 tablet, Rfl: 3   dicyclomine (BENTYL) 10 MG capsule, Take 1 capsule (10 mg total) by mouth 4 (four) times daily -  before meals and at bedtime for 7 days., Disp: 28 capsule, Rfl: 0   dorzolamide-timolol (COSOPT) 22.3-6.8 MG/ML ophthalmic solution, Place 1 drop into both eyes 2 (two) times daily., Disp: , Rfl:    Empagliflozin-metFORMIN HCl (SYNJARDY) 12.12-998 MG TABS, Take 1 tablet by mouth 2 (two) times daily., Disp: 180 tablet, Rfl: 1   glucose blood test strip, Use up to twice a day to check blood sugars, Disp: 50 each, Rfl: 12   lisinopril (ZESTRIL) 40 MG tablet, Take 1 tablet (40 mg total) by mouth daily., Disp: 90 tablet, Rfl: 3   Multiple Vitamin (MULTIVITAMIN) tablet, Take 1 tablet by mouth daily., Disp: , Rfl:    OneTouch Delica Lancets 30G MISC, 1 each by Does not apply route in the morning and at bedtime., Disp:  100 each, Rfl: 11   SUMAtriptan (IMITREX) 100 MG tablet, TAKE 1 TABLET AT ONSET-MAY REPEAT IN 2 HOURS AS NEEDED- NO MORE THAN 2 IN 24 HOURS. (Patient taking differently: Take 100 mg by mouth every 2 (two) hours as needed for migraine.), Disp: 10 tablet, Rfl: 1   triamterene-hydrochlorothiazide (MAXZIDE-25) 37.5-25 MG tablet, Take 1 tablet by mouth daily., Disp: 90 tablet, Rfl: 3   valACYclovir (VALTREX) 500 MG tablet, Take one tablet po bid for five days prn outbreak, Disp: 90 tablet, Rfl: 0  Allergies  Allergen Reactions   Mounjaro [Tirzepatide] Nausea And Vomiting    Stomach  pains  Lost ten pounds   Prednisone Other (See Comments)    Increased agitation     Objective:   BP 122/84   Pulse (!) 103   Temp (!) 97.2 F (36.2 C) (Temporal)   Ht 5\' 3"  (1.6 m)   Wt 158 lb (71.7 kg)   LMP 12/29/2016   SpO2 99%   BMI 27.99 kg/m    Physical Exam Constitutional:      General: She is not in acute distress.    Appearance: Normal appearance. She is normal weight. She is not ill-appearing, toxic-appearing or diaphoretic.  HENT:     Head: Normocephalic.  Cardiovascular:     Rate and Rhythm: Normal rate.  Pulmonary:     Effort: Pulmonary effort is normal.  Abdominal:     General: Bowel sounds are normal.     Palpations: Abdomen is soft.     Tenderness: There is abdominal tenderness in the left upper quadrant and left lower quadrant. Negative signs include Murphy's sign and McBurney's sign.     Hernia: No hernia is present.       Comments: Mild rlq  Pulse ausculated in mid to left abdominal quadrant  Musculoskeletal:        General: Normal range of motion.  Neurological:     General: No focal deficit present.     Mental Status: She is alert and oriented to person, place, and time. Mental status is at baseline.  Psychiatric:        Mood and Affect: Mood normal.        Behavior: Behavior normal.        Thought Content: Thought content normal.        Judgment: Judgment normal.     Assessment & Plan:  Abdominal cramping Assessment & Plan: AAA less likely however will keep in mind in the future.  Suspect this is from GI virus, trial dicyclomine and bland diet.   Orders: -     Dicyclomine HCl; Take 1 capsule (10 mg total) by mouth 4 (four) times daily -  before meals and at bedtime for 7 days.  Dispense: 28 capsule; Refill: 0 -     CT ABDOMEN PELVIS W CONTRAST; Future  Pulmonary nodules -     CT ABDOMEN PELVIS W CONTRAST; Future  Hemangioma of spleen -     CT ABDOMEN PELVIS W CONTRAST; Future  Left lower quadrant abdominal pain Assessment &  Plan: Suspect possible GI virus Ordering CT abd pelvis mainly to re-investigage prior 2018 CT with splenic lesion and pulmonary nodules and also investigate if any intestinal inflammation.  Did advise pt red flag symptoms and when to seek more urgent care.  Pt verbalized understanding.   Orders: -     CT ABDOMEN PELVIS W CONTRAST; Future  Abdominal aortic pulsation Assessment & Plan: R/o AAA  Stat CT abd pelvis w  contrast pending results. Discussed red flag symptoms with pt  R/f low however with diabetes, hypertension, no h/o smoking  Orders: -     CT ABDOMEN PELVIS W CONTRAST; Future   This am biscuit and coffee around 9 am   Follow up plan: Return in about 10 days (around 03/10/2023) for f/u abdominal pain .  Mort Sawyers, FNP

## 2023-02-28 NOTE — Assessment & Plan Note (Signed)
AAA less likely however will keep in mind in the future.  Suspect this is from GI virus, trial dicyclomine and bland diet.

## 2023-02-28 NOTE — Assessment & Plan Note (Signed)
Suspect possible GI virus Ordering CT abd pelvis mainly to re-investigage prior 2018 CT with splenic lesion and pulmonary nodules and also investigate if any intestinal inflammation.  Did advise pt red flag symptoms and when to seek more urgent care.  Pt verbalized understanding.

## 2023-02-28 NOTE — Telephone Encounter (Signed)
Brandy from [facility] called stating the PA from the referral has the wrong facility listed. Please advise. Call back # [phone], ext [phone]

## 2023-03-01 ENCOUNTER — Ambulatory Visit: Admission: RE | Admit: 2023-03-01 | Payer: 59 | Source: Ambulatory Visit

## 2023-03-01 NOTE — Telephone Encounter (Signed)
Thank you :)

## 2023-03-04 ENCOUNTER — Ambulatory Visit
Admission: RE | Admit: 2023-03-04 | Discharge: 2023-03-04 | Disposition: A | Discharge status: Home / Self Care | Payer: 59 | Source: Ambulatory Visit | Attending: Family | Admitting: Family

## 2023-03-04 DIAGNOSIS — R1032 Left lower quadrant pain: Secondary | ICD-10-CM | POA: Diagnosis present

## 2023-03-04 DIAGNOSIS — D1803 Hemangioma of intra-abdominal structures: Secondary | ICD-10-CM

## 2023-03-04 DIAGNOSIS — R918 Other nonspecific abnormal finding of lung field: Secondary | ICD-10-CM | POA: Diagnosis present

## 2023-03-04 DIAGNOSIS — R109 Unspecified abdominal pain: Secondary | ICD-10-CM | POA: Diagnosis present

## 2023-03-04 DIAGNOSIS — R0989 Other specified symptoms and signs involving the circulatory and respiratory systems: Secondary | ICD-10-CM | POA: Diagnosis present

## 2023-03-04 LAB — POCT I-STAT CREATININE: Creatinine, Ser: 1.1 mg/dL — ABNORMAL HIGH (ref 0.44–1.00)

## 2023-03-04 MED ORDER — IOHEXOL 9 MG/ML PO SOLN
500.0000 mL | ORAL | Status: AC
  Administered 2023-03-04 (×3): 500 mL via ORAL

## 2023-03-04 MED ORDER — IOHEXOL 300 MG/ML  SOLN
100.0000 mL | Freq: Once | INTRAMUSCULAR | Status: AC | PRN
  Administered 2023-03-04: 100 mL via INTRAVENOUS

## 2023-03-07 ENCOUNTER — Other Ambulatory Visit: Payer: Self-pay | Admitting: Family

## 2023-03-07 DIAGNOSIS — Z77090 Contact with and (suspected) exposure to asbestos: Secondary | ICD-10-CM

## 2023-03-07 DIAGNOSIS — R918 Other nonspecific abnormal finding of lung field: Secondary | ICD-10-CM

## 2023-03-07 NOTE — Progress Notes (Signed)
No acute findings to suggest reason for abdominal pain.  Splenic lesion still present however decreased in size since 2018 and suspected to be a benign hemangioma.  The lungs dow suggest previous asbestos exposure. I am going to send her to pulmonary just to follow this. No cough ongoing, shortness of breath, difficulty taking a deep breath? Has pain improved?

## 2023-03-14 ENCOUNTER — Other Ambulatory Visit: Payer: Self-pay | Admitting: Family

## 2023-03-14 DIAGNOSIS — B009 Herpesviral infection, unspecified: Secondary | ICD-10-CM

## 2023-03-30 ENCOUNTER — Encounter (INDEPENDENT_AMBULATORY_CARE_PROVIDER_SITE_OTHER): Payer: Self-pay

## 2023-04-12 ENCOUNTER — Encounter: Payer: Self-pay | Admitting: Family

## 2023-04-13 ENCOUNTER — Encounter: Payer: Self-pay | Admitting: Family

## 2023-04-20 ENCOUNTER — Institutional Professional Consult (permissible substitution): Payer: 59 | Admitting: Pulmonary Disease

## 2023-04-27 ENCOUNTER — Encounter: Payer: Self-pay | Admitting: Family

## 2023-04-27 DIAGNOSIS — I10 Essential (primary) hypertension: Secondary | ICD-10-CM

## 2023-04-27 DIAGNOSIS — E785 Hyperlipidemia, unspecified: Secondary | ICD-10-CM

## 2023-04-27 MED ORDER — ATORVASTATIN CALCIUM 10 MG PO TABS
10.0000 mg | ORAL_TABLET | Freq: Every day | ORAL | 3 refills | Status: DC
Start: 2023-04-27 — End: 2023-05-09

## 2023-05-09 MED ORDER — ATORVASTATIN CALCIUM 10 MG PO TABS
10.0000 mg | ORAL_TABLET | Freq: Every day | ORAL | 3 refills | Status: DC
Start: 2023-05-09 — End: 2024-05-04

## 2023-05-09 MED ORDER — TRIAMTERENE-HCTZ 37.5-25 MG PO TABS
1.0000 | ORAL_TABLET | Freq: Every day | ORAL | 3 refills | Status: DC
Start: 2023-05-09 — End: 2024-05-04

## 2023-05-09 NOTE — Addendum Note (Signed)
Addended by: Mort Sawyers on: 05/09/2023 01:35 PM   Modules accepted: Orders

## 2023-05-10 MED ORDER — LISINOPRIL 40 MG PO TABS
40.0000 mg | ORAL_TABLET | Freq: Every day | ORAL | 3 refills | Status: DC
Start: 2023-05-10 — End: 2024-05-04

## 2023-05-10 NOTE — Addendum Note (Signed)
Addended by: Jaynee Eagles C on: 05/10/2023 07:45 AM   Modules accepted: Orders

## 2023-05-25 ENCOUNTER — Encounter: Payer: Self-pay | Admitting: Family

## 2023-05-26 NOTE — Telephone Encounter (Signed)
I am assuming this is a physical form?  If yes, can you just double check she hasn't had a physical in one year and if not she needs to schedule an appointment. Will not be by this week unfortunately however they typically give them a year to get this set up.

## 2023-06-08 ENCOUNTER — Encounter: Payer: Medicaid Other | Admitting: Family

## 2023-08-26 ENCOUNTER — Encounter: Payer: Medicaid Other | Admitting: Family

## 2023-09-06 ENCOUNTER — Ambulatory Visit (INDEPENDENT_AMBULATORY_CARE_PROVIDER_SITE_OTHER): Payer: Medicaid Other | Admitting: Family

## 2023-09-06 ENCOUNTER — Encounter: Payer: Self-pay | Admitting: Family

## 2023-09-06 VITALS — BP 120/86 | HR 86 | Temp 98.5°F | Ht 63.0 in | Wt 173.0 lb

## 2023-09-06 DIAGNOSIS — E1169 Type 2 diabetes mellitus with other specified complication: Secondary | ICD-10-CM | POA: Diagnosis not present

## 2023-09-06 DIAGNOSIS — Z7984 Long term (current) use of oral hypoglycemic drugs: Secondary | ICD-10-CM

## 2023-09-06 DIAGNOSIS — N3941 Urge incontinence: Secondary | ICD-10-CM

## 2023-09-06 DIAGNOSIS — I10 Essential (primary) hypertension: Secondary | ICD-10-CM

## 2023-09-06 DIAGNOSIS — Z0000 Encounter for general adult medical examination without abnormal findings: Secondary | ICD-10-CM | POA: Diagnosis not present

## 2023-09-06 DIAGNOSIS — E7800 Pure hypercholesterolemia, unspecified: Secondary | ICD-10-CM | POA: Diagnosis not present

## 2023-09-06 DIAGNOSIS — Z0001 Encounter for general adult medical examination with abnormal findings: Secondary | ICD-10-CM | POA: Insufficient documentation

## 2023-09-06 DIAGNOSIS — E785 Hyperlipidemia, unspecified: Secondary | ICD-10-CM | POA: Diagnosis not present

## 2023-09-06 DIAGNOSIS — D500 Iron deficiency anemia secondary to blood loss (chronic): Secondary | ICD-10-CM

## 2023-09-06 LAB — LIPID PANEL
Cholesterol: 134 mg/dL (ref 0–200)
HDL: 42.5 mg/dL (ref >39.00)
LDL Cholesterol: 76 mg/dL (ref 0–99)
NonHDL: 91.74
Total CHOL/HDL Ratio: 3
Triglycerides: 77 mg/dL (ref 0.0–149.0)
VLDL: 15.4 mg/dL (ref 0.0–40.0)

## 2023-09-06 LAB — COMPREHENSIVE METABOLIC PANEL
ALT: 16 U/L (ref 0–35)
AST: 15 U/L (ref 0–37)
Albumin: 4.6 g/dL (ref 3.5–5.2)
Alkaline Phosphatase: 59 U/L (ref 39–117)
BUN: 16 mg/dL (ref 6–23)
CO2: 29 meq/L (ref 19–32)
Calcium: 10.3 mg/dL (ref 8.4–10.5)
Chloride: 104 meq/L (ref 96–112)
Creatinine, Ser: 0.94 mg/dL (ref 0.40–1.20)
GFR: 68.39 mL/min (ref >60.00)
Glucose, Bld: 135 mg/dL — ABNORMAL HIGH (ref 70–99)
Potassium: 4 meq/L (ref 3.5–5.1)
Sodium: 139 meq/L (ref 135–145)
Total Bilirubin: 0.5 mg/dL (ref 0.2–1.2)
Total Protein: 7 g/dL (ref 6.0–8.3)

## 2023-09-06 LAB — CBC
HCT: 36.2 % (ref 36.0–46.0)
Hemoglobin: 12.1 g/dL (ref 12.0–15.0)
MCHC: 33.4 g/dL (ref 30.0–36.0)
MCV: 84.7 fL (ref 78.0–100.0)
Platelets: 323 10*3/uL (ref 150.0–400.0)
RBC: 4.27 Mil/uL (ref 3.87–5.11)
RDW: 13.1 % (ref 11.5–15.5)
WBC: 5.7 10*3/uL (ref 4.0–10.5)

## 2023-09-06 LAB — MICROALBUMIN / CREATININE URINE RATIO
Creatinine,U: 68.8 mg/dL
Microalb Creat Ratio: 1 mg/g (ref 0.0–30.0)
Microalb, Ur: <0.7 mg/dL (ref 0.0–1.9)

## 2023-09-06 LAB — HEMOGLOBIN A1C: Hgb A1c MFr Bld: 8.1 % — ABNORMAL HIGH (ref 4.6–6.5)

## 2023-09-06 MED ORDER — METFORMIN HCL 1000 MG PO TABS: 1000.0000 mg | ORAL_TABLET | Freq: Two times a day (BID) | ORAL | 3 refills | Status: DC

## 2023-09-06 NOTE — Assessment & Plan Note (Signed)
 Continue lisinopril 40 mg once daily. Pt advised of the following:  Continue medication as prescribed. Monitor blood pressure periodically and/or when you feel symptomatic. Goal is <130/90 on average. Ensure that you have rested for 30 minutes prior to checking your blood pressure. Record your readings and bring them to your next visit if necessary.work on a low sodium diet.

## 2023-09-06 NOTE — Assessment & Plan Note (Signed)
 Ongoing Michelle Castillo was too expensive Sending referral for urology for eval/treat

## 2023-09-06 NOTE — Assessment & Plan Note (Signed)
Cbc today pending results 

## 2023-09-06 NOTE — Assessment & Plan Note (Signed)
 Ordered lipid panel, pending results. Work on low cholesterol diet and exercise as tolerated Continue atorvastatin 10 mg nightly

## 2023-09-06 NOTE — Patient Instructions (Signed)
A referral was placed today  Please let us know if you have not heard back within 2 weeks about the referral.  

## 2023-09-06 NOTE — Assessment & Plan Note (Signed)

## 2023-09-06 NOTE — Progress Notes (Signed)
 Subjective:  Patient ID: Michelle Castillo, female    DOB: 03/11/1966  Age: 58 y.o. MRN: [id number]  Patient Care Team: Corwin Antu, FNP as PCP - General (Family Medicine) Rodgers Barnie RAMAN, CNM as Referring Physician (Certified Nurse Midwife)   CC:  Chief Complaint  Patient presents with   Annual Exam    HPI Michelle Castillo is a 58 y.o. female who presents today for an annual physical exam. She reports consuming a general diet. The patient does not participate in regular exercise at present. She generally feels well. She reports sleeping well. She does have additional problems to discuss today.   Vision:Within last year Dental:Receives regular dental care  Mammogram: 01/17/23 Last pap: lap hysterectomy with salpingectomy. No abn paps. Does see gynecology for pelvic exams.  Colonoscopy: 04/14/20 every five years   Pt is with acute concerns.   DM2: pt was taking synjardy  however about six months ago she went off of this because it was causing her some abdominal discomfort. She self restarted her metformin  in August. She is taking 1000 mg twice daily. She self d/c her jardiance  as well because she stated that it was too expensive and did not reach out.   HTN: she is still taking maxzide  and also lisinopril  4 mg once daily.   HLD: taking atorvastatin  10 mg nightly tolerating well.   Urinary urgency, has tried toviaz  but the cost was prohibitive. Still experiences this urgency incontinence regularly.   Coming in with a form for respirator evaluation. She is to be FIT tested however she she had diabetes and HTN they are requiring her to be FIT tested   Advanced Directives Patient does not have advanced directives    DEPRESSION SCREENING    09/06/2023    9:52 AM 02/28/2023   12:21 PM 01/06/2023    8:17 AM 09/04/2020    8:57 AM  PHQ 2/9 Scores  PHQ - 2 Score 1 0 1 0  PHQ- 9 Score 2        ROS: Negative unless specifically indicated above in HPI.    Current Outpatient  Medications:    atorvastatin  (LIPITOR) 10 MG tablet, Take 1 tablet (10 mg total) by mouth daily., Disp: 90 tablet, Rfl: 3   dorzolamide-timolol (COSOPT) 22.3-6.8 MG/ML ophthalmic solution, Place 1 drop into both eyes 2 (two) times daily., Disp: , Rfl:    glucose blood test strip, Use up to twice a day to check blood sugars, Disp: 50 each, Rfl: 12   lisinopril  (ZESTRIL ) 40 MG tablet, Take 1 tablet (40 mg total) by mouth daily., Disp: 90 tablet, Rfl: 3   metFORMIN  (GLUCOPHAGE ) 1000 MG tablet, Take 1 tablet (1,000 mg total) by mouth 2 (two) times daily with a meal., Disp: 180 tablet, Rfl: 3   Multiple Vitamin (MULTIVITAMIN) tablet, Take 1 tablet by mouth daily., Disp: , Rfl:    OneTouch Delica Lancets 30G MISC, 1 each by Does not apply route in the morning and at bedtime., Disp: 100 each, Rfl: 11   SUMAtriptan  (IMITREX ) 100 MG tablet, TAKE 1 TABLET AT ONSET-MAY REPEAT IN 2 HOURS AS NEEDED- NO MORE THAN 2 IN 24 HOURS. (Patient taking differently: Take 100 mg by mouth every 2 (two) hours as needed for migraine.), Disp: 10 tablet, Rfl: 1   triamterene -hydrochlorothiazide  (MAXZIDE -25) 37.5-25 MG tablet, Take 1 tablet by mouth daily., Disp: 90 tablet, Rfl: 3   valACYclovir  (VALTREX ) 500 MG tablet, TAKE 1 TABLET BY MOUTH TWICE  DAILY FOR 5 DAYS AS  NEEDED FOR  OUTBREAK, Disp: 90 tablet, Rfl: 0    Objective:    BP 120/86 (BP Location: Right Arm, Patient Position: Sitting, Cuff Size: Normal)   Pulse 86   Temp 98.5 F (36.9 C) (Temporal)   Ht 5' 3 (1.6 m)   Wt 173 lb (78.5 kg)   LMP 12/29/2016   SpO2 98%   BMI 30.65 kg/m   BP Readings from Last 3 Encounters:  09/06/23 120/86  02/28/23 122/84  02/17/23 134/80      Physical Exam Constitutional:      General: She is not in acute distress.    Appearance: Normal appearance. She is obese. She is not ill-appearing.  HENT:     Head: Normocephalic.     Right Ear: Tympanic membrane normal.     Left Ear: Tympanic membrane normal.     Nose: Nose  normal.     Mouth/Throat:     Mouth: Mucous membranes are moist.  Eyes:     Extraocular Movements: Extraocular movements intact.     Pupils: Pupils are equal, round, and reactive to light.  Cardiovascular:     Rate and Rhythm: Normal rate and regular rhythm.  Pulmonary:     Effort: Pulmonary effort is normal.     Breath sounds: Normal breath sounds.  Musculoskeletal:        General: Normal range of motion.     Cervical back: Normal range of motion.  Skin:    General: Skin is warm.     Capillary Refill: Capillary refill takes less than 2 seconds.  Neurological:     General: No focal deficit present.     Mental Status: She is alert.  Psychiatric:        Mood and Affect: Mood normal.        Behavior: Behavior normal.        Thought Content: Thought content normal.        Judgment: Judgment normal.          Assessment & Plan:  Type 2 diabetes mellitus with other specified complication, without long-term current use of insulin  (HCC) Assessment & Plan: Non compliant Continue metformin  1000 mg po bid  Ordered hga1c today pending results. Work on diabetic diet and exercise as tolerated. Yearly foot exam, and annual eye exam.   Consider SGLT2 and or Dpp 4 if needed additional treatment   Orders: -     metFORMIN  HCl; Take 1 tablet (1,000 mg total) by mouth 2 (two) times daily with a meal.  Dispense: 180 tablet; Refill: 3 -     Hemoglobin A1c -     Microalbumin / creatinine urine ratio -     Comprehensive metabolic panel  Encounter for general adult medical examination with abnormal findings Assessment & Plan: Patient Counseling(The following topics were reviewed):  Preventative care handout given to pt  Health maintenance and immunizations reviewed. Please refer to Health maintenance section. Pt advised on safe sex, wearing seatbelts in car, and proper nutrition labwork ordered today for annual Dental health: Discussed importance of regular tooth brushing, flossing, and  dental visits.   Orders: -     metFORMIN  HCl; Take 1 tablet (1,000 mg total) by mouth 2 (two) times daily with a meal.  Dispense: 180 tablet; Refill: 3 -     Hemoglobin A1c -     Microalbumin / creatinine urine ratio -     Comprehensive metabolic panel -     CBC -     Lipid panel  Essential hypertension Assessment & Plan: Continue lisinopril  40 mg once daily. Pt advised of the following:  Continue medication as prescribed. Monitor blood pressure periodically and/or when you feel symptomatic. Goal is <130/90 on average. Ensure that you have rested for 30 minutes prior to checking your blood pressure. Record your readings and bring them to your next visit if necessary.work on a low sodium diet.    Iron deficiency anemia due to chronic blood loss Assessment & Plan: Cbc today pending results.    HYPERCHOLESTEROLEMIA -     CBC -     Lipid panel  Urgency incontinence Assessment & Plan: Ongoing toviaz  was too expensive Sending referral for urology for eval/treat   Orders: -     Ambulatory referral to Urology  Hyperlipidemia associated with type 2 diabetes mellitus (HCC) Assessment & Plan: Ordered lipid panel, pending results. Work on low cholesterol diet and exercise as tolerated Continue atorvastatin  10 mg nightly        Follow-up: Return in about 6 months (around 03/05/2024) for f/u diabetes, f/u blood pressure.   Ginger Patrick, FNP

## 2023-09-06 NOTE — Assessment & Plan Note (Addendum)
 Non compliant Continue metformin 1000 mg po bid  Ordered hga1c today pending results. Work on diabetic diet and exercise as tolerated. Yearly foot exam, and annual eye exam.   Consider SGLT2 and or Dpp 4 if needed additional treatment

## 2023-09-07 ENCOUNTER — Encounter: Payer: Self-pay | Admitting: Family

## 2023-09-07 ENCOUNTER — Other Ambulatory Visit: Payer: Self-pay | Admitting: Family

## 2023-09-07 DIAGNOSIS — E1169 Type 2 diabetes mellitus with other specified complication: Secondary | ICD-10-CM

## 2023-09-07 MED ORDER — SITAGLIPTIN PHOSPHATE 50 MG PO TABS
50.0000 mg | ORAL_TABLET | Freq: Every day | ORAL | 1 refills | Status: DC
Start: 2023-09-07 — End: 2023-09-16

## 2023-09-16 MED ORDER — LINAGLIPTIN 5 MG PO TABS
5.0000 mg | ORAL_TABLET | Freq: Every day | ORAL | 0 refills | Status: DC
Start: 2023-09-16 — End: 2023-09-19

## 2023-09-16 NOTE — Addendum Note (Signed)
Addended by: Mort Sawyers on: 09/16/2023 08:48 AM   Modules accepted: Orders

## 2023-09-16 NOTE — Telephone Encounter (Signed)
PT informed of denial in separate message, signing off on request

## 2023-09-19 MED ORDER — LINAGLIPTIN 5 MG PO TABS
5.0000 mg | ORAL_TABLET | Freq: Every day | ORAL | 0 refills | Status: DC
Start: 2023-09-19 — End: 2023-12-19

## 2023-09-19 NOTE — Addendum Note (Signed)
Addended by: Jaynee Eagles C on: 09/19/2023 07:54 AM   Modules accepted: Orders

## 2023-09-20 ENCOUNTER — Other Ambulatory Visit (HOSPITAL_COMMUNITY): Payer: Self-pay

## 2023-09-23 ENCOUNTER — Telehealth: Payer: Self-pay

## 2023-09-23 ENCOUNTER — Other Ambulatory Visit (HOSPITAL_COMMUNITY): Payer: Self-pay

## 2023-09-23 NOTE — Telephone Encounter (Deleted)
Pharmacy Patient Advocate Encounter   Received notification from Patient Advice Request messages that prior authorization for Tradjenta 5MG  tablets is required/requested.   Insurance verification completed.   The patient is insured through [place] .   Per test claim: PA required; PA started via CoverMyMeds. KEY [id number] . Waiting for clinical questions to populate.

## 2023-09-23 NOTE — Telephone Encounter (Signed)
ERROR

## 2023-09-23 NOTE — Telephone Encounter (Signed)
Pharmacy Patient Advocate Encounter  Received notification from [facility] Medicaid that Prior Authorization for Tradjenta 5MG  tablets has been APPROVED from 09/19/2023 to 09/18/2024. Ran test claim, Copay is $4.00 for a 90 day supply. This test claim was processed through [facility]- copay amounts may vary at other pharmacies due to pharmacy/plan contracts, or as the patient moves through the different stages of their insurance plan.   PA #/Case ID/Reference #: [id number]

## 2023-09-23 NOTE — Telephone Encounter (Signed)
Pharmacy Patient Advocate Encounter   Received notification from Patient Advice Request messages that prior authorization for Tradjenta 5MG  tablets is required/requested.   Insurance verification completed.   The patient is insured through [facility] [place] .   Per test claim: PA required; PA submitted to above mentioned insurance via CoverMyMeds Key/confirmation #/EOC [facility] Status is pending

## 2023-09-27 ENCOUNTER — Other Ambulatory Visit (HOSPITAL_COMMUNITY): Payer: Self-pay

## 2023-09-27 ENCOUNTER — Encounter: Payer: Self-pay | Admitting: Family

## 2023-09-27 ENCOUNTER — Encounter: Payer: Self-pay | Admitting: Urology

## 2023-09-27 DIAGNOSIS — E1169 Type 2 diabetes mellitus with other specified complication: Secondary | ICD-10-CM

## 2023-09-28 NOTE — Telephone Encounter (Signed)
Please call pt I am confused.   How long has the tingling been going on? Did it start when she started the tradjenta?   She has been taking metformin for some time. Did the symptoms start when she went to 2000 mg? And when did she increase?

## 2023-09-28 NOTE — Telephone Encounter (Signed)
Spoke with pt and she states that her hands and feet started tingling about 2 weeks ago. She is taking Metformin 2000mg  daily. She feels like this is coming from the increase in metformin as she has only had 2 days of Tradjenta. Reports that the tingling is more bothersome than it is painful. Feels like her hands and feet have "fallen asleep" but she still has sensation in them.

## 2023-10-03 MED ORDER — BLOOD GLUCOSE MONITORING SUPPL DEVI: 1.0000 | Freq: Three times a day (TID) | 0 refills | Status: DC

## 2023-10-03 MED ORDER — LANCET DEVICE MISC
1.0000 | Freq: Three times a day (TID) | 0 refills | Status: AC
Start: 2023-10-03 — End: 2023-11-02

## 2023-10-03 MED ORDER — BLOOD GLUCOSE TEST VI STRP
1.0000 | ORAL_STRIP | Freq: Three times a day (TID) | 0 refills | Status: AC
Start: 2023-10-03 — End: 2023-11-02

## 2023-10-03 NOTE — Addendum Note (Signed)
Addended by: Mort Sawyers on: 10/03/2023 02:26 PM   Modules accepted: Orders

## 2023-10-06 ENCOUNTER — Other Ambulatory Visit: Payer: Self-pay | Admitting: *Deleted

## 2023-10-06 MED ORDER — ACCU-CHEK SOFTCLIX LANCETS MISC: 11 refills | Status: AC

## 2023-12-05 ENCOUNTER — Ambulatory Visit: Payer: Self-pay | Admitting: Urology

## 2023-12-13 ENCOUNTER — Ambulatory Visit (INDEPENDENT_AMBULATORY_CARE_PROVIDER_SITE_OTHER): Admitting: Family

## 2023-12-13 ENCOUNTER — Other Ambulatory Visit (HOSPITAL_COMMUNITY): Payer: Self-pay

## 2023-12-13 ENCOUNTER — Encounter: Payer: Self-pay | Admitting: Family

## 2023-12-13 ENCOUNTER — Telehealth: Payer: Self-pay | Admitting: Pharmacy Technician

## 2023-12-13 VITALS — BP 124/68 | HR 82 | Temp 98.3°F | Ht 63.0 in | Wt 166.2 lb

## 2023-12-13 DIAGNOSIS — E1169 Type 2 diabetes mellitus with other specified complication: Secondary | ICD-10-CM

## 2023-12-13 DIAGNOSIS — E785 Hyperlipidemia, unspecified: Secondary | ICD-10-CM | POA: Diagnosis not present

## 2023-12-13 DIAGNOSIS — I10 Essential (primary) hypertension: Secondary | ICD-10-CM

## 2023-12-13 DIAGNOSIS — Z7984 Long term (current) use of oral hypoglycemic drugs: Secondary | ICD-10-CM

## 2023-12-13 LAB — POCT GLYCOSYLATED HEMOGLOBIN (HGB A1C): Hemoglobin A1C: 6.7 % — AB (ref 4.0–5.6)

## 2023-12-13 MED ORDER — DAPAGLIFLOZIN PROPANEDIOL 5 MG PO TABS
5.0000 mg | ORAL_TABLET | Freq: Every day | ORAL | 0 refills | Status: DC
Start: 2023-12-13 — End: 2024-03-12

## 2023-12-13 NOTE — Progress Notes (Signed)
 Established Patient Office Visit  Subjective:      CC:  Chief Complaint  Patient presents with   Follow-up    A1C    HPI: Michelle Castillo is a 58 y.o. female presenting on 12/13/2023 for Follow-up (A1C) . DM2: on metformin 1000 mg bid and tradjenta. Has tried mounjaro in the past but it gave her stomach pains.   HTN: lisinopril 40 mg once daily. Doing well, stable in office today 124/68.   Urinary incontinence: referred to urology last visit as Gala Murdoch was too expensive. Had appt last week but had to reschedule.   Wt Readings from Last 3 Encounters:  12/13/23 166 lb 3.2 oz (75.4 kg)  09/06/23 173 lb (78.5 kg)  02/28/23 158 lb (71.7 kg)        Social history:  Relevant past medical, surgical, family and social history reviewed and updated as indicated. Interim medical history since our last visit reviewed.  Allergies and medications reviewed and updated.  DATA REVIEWED: CHART IN EPIC     ROS: Negative unless specifically indicated above in HPI.    Current Outpatient Medications:    Accu-Chek Softclix Lancets lancets, Use twice daily to check blood sugar, Disp: 100 each, Rfl: 11   atorvastatin (LIPITOR) 10 MG tablet, Take 1 tablet (10 mg total) by mouth daily., Disp: 90 tablet, Rfl: 3   Blood Glucose Monitoring Suppl DEVI, 1 each by Does not apply route in the morning, at noon, and at bedtime. May substitute to any manufacturer covered by patient's insurance., Disp: 1 each, Rfl: 0   dapagliflozin propanediol (FARXIGA) 5 MG TABS tablet, Take 1 tablet (5 mg total) by mouth daily before breakfast., Disp: 90 tablet, Rfl: 0   dorzolamide-timolol (COSOPT) 22.3-6.8 MG/ML ophthalmic solution, Place 1 drop into both eyes 2 (two) times daily., Disp: , Rfl:    linagliptin (TRADJENTA) 5 MG TABS tablet, Take 1 tablet (5 mg total) by mouth daily., Disp: 90 tablet, Rfl: 0   lisinopril (ZESTRIL) 40 MG tablet, Take 1 tablet (40 mg total) by mouth daily., Disp: 90 tablet, Rfl: 3    metFORMIN (GLUCOPHAGE) 1000 MG tablet, Take 1 tablet (1,000 mg total) by mouth 2 (two) times daily with a meal., Disp: 180 tablet, Rfl: 3   Multiple Vitamin (MULTIVITAMIN) tablet, Take 1 tablet by mouth daily., Disp: , Rfl:    SUMAtriptan (IMITREX) 100 MG tablet, TAKE 1 TABLET AT ONSET-MAY REPEAT IN 2 HOURS AS NEEDED- NO MORE THAN 2 IN 24 HOURS. (Patient taking differently: Take 100 mg by mouth every 2 (two) hours as needed for migraine.), Disp: 10 tablet, Rfl: 1   triamterene-hydrochlorothiazide (MAXZIDE-25) 37.5-25 MG tablet, Take 1 tablet by mouth daily., Disp: 90 tablet, Rfl: 3   valACYclovir (VALTREX) 500 MG tablet, TAKE 1 TABLET BY MOUTH TWICE  DAILY FOR 5 DAYS AS NEEDED FOR  OUTBREAK, Disp: 90 tablet, Rfl: 0      Objective:    BP 124/68   Pulse 82   Temp 98.3 F (36.8 C) (Oral)   Ht 5\' 3"  (1.6 m)   Wt 166 lb 3.2 oz (75.4 kg)   LMP 12/29/2016   SpO2 98%   BMI 29.44 kg/m   Wt Readings from Last 3 Encounters:  12/13/23 166 lb 3.2 oz (75.4 kg)  09/06/23 173 lb (78.5 kg)  02/28/23 158 lb (71.7 kg)    Physical Exam Constitutional:      General: She is not in acute distress.    Appearance: Normal appearance. She  is normal weight. She is not ill-appearing, toxic-appearing or diaphoretic.  HENT:     Head: Normocephalic.  Cardiovascular:     Rate and Rhythm: Normal rate and regular rhythm.  Pulmonary:     Effort: Pulmonary effort is normal.     Breath sounds: Normal breath sounds.  Musculoskeletal:        General: Normal range of motion.  Neurological:     General: No focal deficit present.     Mental Status: She is alert and oriented to person, place, and time. Mental status is at baseline.  Psychiatric:        Mood and Affect: Mood normal.        Behavior: Behavior normal.        Thought Content: Thought content normal.        Judgment: Judgment normal.           Assessment & Plan:  Essential hypertension Assessment & Plan: Continue lisinopril 40 mg once  daily. Pt advised of the following:  Continue medication as prescribed. Monitor blood pressure periodically and/or when you feel symptomatic. Goal is <130/90 on average. Ensure that you have rested for 30 minutes prior to checking your blood pressure. Record your readings and bring them to your next visit if necessary.work on a low sodium diet.    Type 2 diabetes mellitus with other specified complication, without long-term current use of insulin (HCC) Assessment & Plan: Much improved A1C today.  Add on farxiga, will see if pt tolerates.  Intolerance to synjardy but may have been combo with metformin.  Will repeat A1c x 3 months.  Urine m/a and eye exam and foot exam utd   Orders: -     POCT glycosylated hemoglobin (Hb A1C) -     Dapagliflozin Propanediol; Take 1 tablet (5 mg total) by mouth daily before breakfast.  Dispense: 90 tablet; Refill: 0 -     Hemoglobin A1c; Future  Hyperlipidemia associated with type 2 diabetes mellitus (HCC) Assessment & Plan: Within goal limits.  Work on low cholesterol diet and exercise as tolerated Continue atorvastatin 10 mg nightly   Orders: -     Lipid panel; Future     Return in about 6 months (around 06/13/2024) for f/u diabetes.  Felicita Horns, MSN, APRN, FNP-C [facility]   Last metabolic panel

## 2023-12-13 NOTE — Telephone Encounter (Signed)
 Pharmacy Patient Advocate Encounter  Received notification from [facility] Medicaid that Prior Authorization for Michelle Castillo has been APPROVED from 11/29/23 to 12/12/24. Ran test claim, Copay is $4.00. This test claim was processed through [facility]- copay amounts may vary at other pharmacies due to pharmacy/plan contracts, or as the patient moves through the different stages of their insurance plan.   PA #/Case ID/Reference #: [id number]

## 2023-12-13 NOTE — Telephone Encounter (Signed)
 Pharmacy Patient Advocate Encounter   Received notification from CoverMyMeds that prior authorization for Farxiga is required/requested.   Insurance verification completed.   The patient is insured through [facility] [employer] [place] .   Per test claim: PA required; PA submitted to above mentioned insurance via CoverMyMeds Key/confirmation #/EOC [id number] Status is pending

## 2023-12-13 NOTE — Assessment & Plan Note (Signed)
 Within goal limits.  Work on low cholesterol diet and exercise as tolerated Continue atorvastatin 10 mg nightly

## 2023-12-13 NOTE — Assessment & Plan Note (Signed)
 Much improved A1C today.  Add on farxiga, will see if pt tolerates.  Intolerance to synjardy but may have been combo with metformin.  Will repeat A1c x 3 months.  Urine m/a and eye exam and foot exam utd

## 2023-12-13 NOTE — Assessment & Plan Note (Signed)
 Continue lisinopril 40 mg once daily. Pt advised of the following:  Continue medication as prescribed. Monitor blood pressure periodically and/or when you feel symptomatic. Goal is <130/90 on average. Ensure that you have rested for 30 minutes prior to checking your blood pressure. Record your readings and bring them to your next visit if necessary.work on a low sodium diet.

## 2023-12-17 ENCOUNTER — Other Ambulatory Visit: Payer: Self-pay | Admitting: Family

## 2023-12-17 DIAGNOSIS — E1169 Type 2 diabetes mellitus with other specified complication: Secondary | ICD-10-CM

## 2023-12-19 ENCOUNTER — Ambulatory Visit: Payer: Self-pay | Admitting: Urology

## 2024-02-08 ENCOUNTER — Other Ambulatory Visit: Payer: Self-pay | Admitting: Obstetrics and Gynecology

## 2024-02-08 DIAGNOSIS — Z1231 Encounter for screening mammogram for malignant neoplasm of breast: Secondary | ICD-10-CM

## 2024-02-10 ENCOUNTER — Ambulatory Visit
Admission: RE | Admit: 2024-02-10 | Discharge: 2024-02-10 | Disposition: A | Discharge status: Home / Self Care | Source: Ambulatory Visit | Attending: Obstetrics and Gynecology

## 2024-02-10 DIAGNOSIS — Z1231 Encounter for screening mammogram for malignant neoplasm of breast: Secondary | ICD-10-CM

## 2024-02-14 ENCOUNTER — Ambulatory Visit: Payer: Self-pay | Admitting: Obstetrics and Gynecology

## 2024-02-20 ENCOUNTER — Encounter: Payer: Self-pay | Admitting: Podiatry

## 2024-02-20 ENCOUNTER — Ambulatory Visit (INDEPENDENT_AMBULATORY_CARE_PROVIDER_SITE_OTHER)

## 2024-02-20 ENCOUNTER — Ambulatory Visit: Admitting: Podiatry

## 2024-02-20 VITALS — BP 125/75 | HR 96 | Temp 98.8°F

## 2024-02-20 DIAGNOSIS — M10471 Other secondary gout, right ankle and foot: Secondary | ICD-10-CM | POA: Diagnosis not present

## 2024-02-20 DIAGNOSIS — M205X1 Other deformities of toe(s) (acquired), right foot: Secondary | ICD-10-CM | POA: Diagnosis not present

## 2024-02-20 DIAGNOSIS — M778 Other enthesopathies, not elsewhere classified: Secondary | ICD-10-CM

## 2024-02-20 MED ORDER — DICLOFENAC SODIUM 75 MG PO TBEC: 75.0000 mg | DELAYED_RELEASE_TABLET | Freq: Two times a day (BID) | ORAL | 0 refills | Status: DC

## 2024-02-20 NOTE — Progress Notes (Signed)
 Subjective:  Patient ID: Michelle Castillo, female    DOB: 1966-04-17,  MRN: [id number]  Chief Complaint  Patient presents with   Foot Pain    I have pain across the top of my foot.  I think it's stemming from the bunion.  It's swollen.    Discussed the use of AI scribe software for clinical note transcription with the patient, who gave verbal consent to proceed.  History of Present Illness Michelle Castillo is a 58 year old female with type 2 diabetes who presents with right foot pain and swelling. She is accompanied by her partner who drove her to the appointment.  She experiences severe, aching, and throbbing pain across the top of her right foot near the big toe, which began a couple of weeks ago, subsided, and then recurred a few days ago. The pain is exacerbated by movement and has impacted her ability to walk and drive, requiring her partner to drive her to the appointment. No redness or heat is associated with the pain, but it was sudden in onset and severe enough to prevent comfortable walking.  She denies any history of gout and is unaware of any family history of the condition. She is currently taking metformin , Tradjenta , and Forteo for her type 2 diabetes, with a recent A1c of approximately 7. She mentions a previous episode where her blood sugar spiked after discontinuing medication, but it has since stabilized.  For pain management, she has been using Aleve and a topical medication, Voltaren. She is open to using an oral version of Voltaren if needed. She has concerns about using oral steroids due to a past adverse reaction related to hemolytic anemia and prefers to avoid them due to potential impacts on her blood sugar levels.      Objective:    Physical Exam VASCULAR: DP and PT pulse palpable. Foot is warm and well-perfused. Capillary fill time is brisk. DERMATOLOGIC: Normal skin turgor, texture, and temperature. No open lesions, rashes, or ulcerations. NEUROLOGIC: Normal  sensation to light touch and pressure. No paresthesias. ORTHOPEDIC: Right first MTP joint tender with dorsal bony prominence. Smooth pain-free range of motion of all other examined joints.     No images are attached to the encounter.    Results Procedure: Corticosteroid injection Description: Following sterile prep with Betadine, the right first MTP joint was injected with twenty milligrams of Kenalog and five mg of Marcaine . Ethyl chloride spray was used as a topical anesthetic. The patient tolerated the procedure well, and the site was dressed with a bandage. Informed Consent: Recommended corticosteroid injection today after discussing the risks and benefits of this versus an oral steroid.  LABS A1c: 7  RADIOLOGY Right foot radiograph: Grade 3 hallux limitus with large spur dorsally and laterally (02/20/2024)   Assessment:   1. Hallux limitus of right foot   2. Acute gout due to other secondary cause involving toe of right foot      Plan:  Patient was evaluated and treated and all questions answered.  Assessment and Plan Assessment & Plan Hallux limitus with arthritis Intermittent pain and swelling in the right great toe at the first MTP joint, with dorsal bony prominence. Symptoms include aching and throbbing pain, exacerbated by movement. Radiographs indicate grade three hallux limitus with large dorsal and lateral spurs. Differential diagnosis includes gouty arthropathy. She prefers corticosteroid injection due to previous adverse reaction to oral steroids and concerns about glycemic control. Injection anticipated to provide relief by Tuesday or Wednesday. Long-term  management may require joint fusion surgery, involving removal of bone spurs, remaining bone, and cartilage, followed by fusion with a plate and screws, depending on symptom recurrence and severity post-injection. - Administer corticosteroid injection to the right first MTP joint with 20 mg of Kenalog and 5 mg of  Marcaine . - Order uric acid, ESR, and BMP to evaluate for gout. - Advise use of a stiff shoe to reduce joint movement and alleviate pain. - Prescribe oral Voltaren for additional pain management if needed, to be taken twice a day. - Advise against taking additional NSAIDs while on Voltaren. - Discuss potential need for future surgical intervention if symptoms persist or worsen.  Type 2 diabetes mellitus Type 2 diabetes mellitus managed with metformin , Tradjenta , and Forteo. Recent A1c level was 7, indicating controlled blood glucose levels. Discussed potential impact of oral steroids on glycemic control.      No follow-ups on file.

## 2024-03-12 ENCOUNTER — Other Ambulatory Visit: Payer: Self-pay | Admitting: Family

## 2024-03-12 DIAGNOSIS — E1169 Type 2 diabetes mellitus with other specified complication: Secondary | ICD-10-CM

## 2024-03-15 ENCOUNTER — Other Ambulatory Visit (HOSPITAL_COMMUNITY): Payer: Self-pay

## 2024-03-15 ENCOUNTER — Ambulatory Visit: Admitting: Obstetrics and Gynecology

## 2024-03-16 ENCOUNTER — Other Ambulatory Visit (HOSPITAL_COMMUNITY): Payer: Self-pay

## 2024-03-19 ENCOUNTER — Other Ambulatory Visit (HOSPITAL_COMMUNITY): Payer: Self-pay

## 2024-03-27 ENCOUNTER — Encounter: Payer: Self-pay | Admitting: Family

## 2024-03-27 DIAGNOSIS — E1169 Type 2 diabetes mellitus with other specified complication: Secondary | ICD-10-CM

## 2024-03-29 MED ORDER — SITAGLIPTIN PHOSPHATE 50 MG PO TABS
50.0000 mg | ORAL_TABLET | Freq: Every day | ORAL | 0 refills | Status: DC
Start: 2024-03-29 — End: 2024-04-03

## 2024-04-02 ENCOUNTER — Other Ambulatory Visit (HOSPITAL_COMMUNITY): Payer: Self-pay

## 2024-04-03 ENCOUNTER — Encounter: Payer: Self-pay | Admitting: Pharmacist

## 2024-04-03 MED ORDER — SAXAGLIPTIN HCL 2.5 MG PO TABS
2.5000 mg | ORAL_TABLET | Freq: Every day | ORAL | 0 refills | Status: DC
Start: 2024-04-03 — End: 2024-07-12
  Filled 2024-05-18: qty 60, 60d supply, fill #0

## 2024-04-03 NOTE — Telephone Encounter (Signed)
 Tradjenta  and

## 2024-04-03 NOTE — Progress Notes (Signed)
 Documentation Reason: Medication Access  Summary: Patient states Januvia  cost is was too expensive at the pharmacy.   Called [facility]: [phone]  Pharmacist at [facility] states Januvia  = not covered by insurance. Confirm they have received Onglyza Rx though have not tried processing yet.   Onglyza = $5.46  Called patient to discuss the above. Verbalized understanding. Plans to pick up the Onglyza this week.    Michelle Castillo, PharmD Clinical Pharmacist [facility] [phone]

## 2024-04-03 NOTE — Addendum Note (Signed)
 Addended by: CORWIN ANTU on: 04/03/2024 09:41 AM   Modules accepted: Orders

## 2024-04-11 ENCOUNTER — Telehealth: Payer: Self-pay

## 2024-04-11 ENCOUNTER — Other Ambulatory Visit (HOSPITAL_COMMUNITY): Payer: Self-pay

## 2024-04-11 NOTE — Telephone Encounter (Signed)
 Pharmacy Patient Advocate Encounter   Received notification from Onbase that prior authorization for sAXagliptin  HCl 2.5MG  tablets  is required/requested.   Insurance verification completed.   The patient is insured through [facility] .   Per test claim: PA required; PA submitted to above mentioned insurance via Latent Key/confirmation #/EOC [id number] Status is pending

## 2024-04-12 ENCOUNTER — Other Ambulatory Visit (HOSPITAL_COMMUNITY): Payer: Self-pay

## 2024-04-12 NOTE — Telephone Encounter (Signed)
 Pharmacy Patient Advocate Encounter  Received notification from [facility] that Prior Authorization for sAXagliptin  HCl 2.5MG  tablets  has been APPROVED from 04/11/24 to 04/10/25   PA #/Case ID/Reference #: [id number]

## 2024-04-18 ENCOUNTER — Ambulatory Visit: Admitting: Obstetrics and Gynecology

## 2024-04-18 NOTE — Progress Notes (Signed)
 58 y.o. G31P1011 female here for annual exam. Divorced.  Patient's last menstrual period was 12/29/2016.    She reports ***. Urine sample provided: ***  Abnormal bleeding: *** Pelvic discharge or pain: *** Breast mass, nipple discharge or skin changes : ***  Sexually active: *** Birth control: *** Last PAP: No results found for: DIAGPAP, HPVHIGH, ADEQPAP Last mammogram: 02/10/24 density C, bi-rads 1 Neg Last colonoscopy: 04/14/20 5 yr recall  Exercising: *** Smoker: ***  Flowsheet Row Office Visit from 12/13/2023 in [facility] [place]  PHQ-2 Total Score 2    Flowsheet Row Office Visit from 12/13/2023 in [facility] [place]  PHQ-9 Total Score 3     GYN HISTORY: ***  OB History  Gravida Para Term Preterm AB Living  3 2 1  1 1   SAB IAB Ectopic Multiple Live Births   1   1    # Outcome Date GA Lbr Len/2nd Weight Sex Type Anes PTL Lv  3 Term           2 IAB           1 Para     M CS-Classical   LIV   Past Medical History:  Diagnosis Date   ANEMIA, IRON DEFICIENCY 2018   DIABETES MELLITUS, TYPE II    Headache(784.0)    HYPERCHOLESTEROLEMIA    HYPERTENSION    Hyperthyroidism    no meds, slightly elevated, MD will recheck in 3 months   Migraines    Peripheral focal chorioretinal inflammation of both eyes    URINARY INCONTINENCE    Past Surgical History:  Procedure Laterality Date   APPENDECTOMY     BARTHOLIN CYST MARSUPIALIZATION Right 02/22/2017   Procedure: BARTHOLIN CYST MARSUPIALIZATION;  Surgeon: Jannis Kate Norris, MD;  Location: [facility];  Service: Gynecology;  Laterality: Right;   CESAREAN SECTION  1998   COLONOSCOPY  last 05/25/2013   CYSTOSCOPY N/A 02/22/2017   Procedure: CYSTOSCOPY;  Surgeon: Jannis Kate Norris, MD;  Location: [facility];  Service: Gynecology;  Laterality: N/A;   LAPAROSCOPIC LYSIS OF ADHESIONS N/A 02/22/2017   Procedure: EXTENSIVE LAPAROSCOPIC LYSIS OF ADHESIONS;  Surgeon:  Jertson, Jill Evelyn, MD;  Location: [facility];  Service: Gynecology;  Laterality: N/A;  Dr. Stevie    OOPHORECTOMY Left 02/22/2017   Procedure: OOPHORECTOMY;  Surgeon: Jannis Kate Norris, MD;  Location: [facility];  Service: Gynecology;  Laterality: Left;   TONSILLECTOMY AND ADENOIDECTOMY     TOTAL LAPAROSCOPIC HYSTERECTOMY WITH SALPINGECTOMY Bilateral 02/22/2017   Procedure: HYSTERECTOMY TOTAL LAPAROSCOPIC WITH SALPINGECTOMY;  Surgeon: Jannis Kate Norris, MD;  Location: [facility];  Service: Gynecology;  Laterality: Bilateral;   Current Outpatient Medications on File Prior to Visit  Medication Sig Dispense Refill   Accu-Chek Softclix Lancets lancets Use twice daily to check blood sugar 100 each 11   atorvastatin  (LIPITOR) 10 MG tablet Take 1 tablet (10 mg total) by mouth daily. 90 tablet 3   Blood Glucose Monitoring Suppl DEVI 1 each by Does not apply route in the morning, at noon, and at bedtime. May substitute to any manufacturer covered by patient's insurance. 1 each 0   diclofenac  (VOLTAREN ) 75 MG EC tablet Take 1 tablet (75 mg total) by mouth 2 (two) times daily. 60 tablet 0   dorzolamide-timolol (COSOPT) 22.3-6.8 MG/ML ophthalmic solution Place 1 drop into both eyes 2 (two) times daily.     FARXIGA  5 MG TABS tablet TAKE 1 TABLET BY MOUTH ONCE  DAILY BEFORE BREAKFAST 90 tablet 0   lisinopril  (ZESTRIL ) 40 MG tablet Take 1 tablet (40 mg total) by mouth daily. 90 tablet 3   metFORMIN  (GLUCOPHAGE ) 1000 MG tablet Take 1 tablet (1,000 mg total) by mouth 2 (two) times daily with a meal. 180 tablet 3   Multiple Vitamin (MULTIVITAMIN) tablet Take 1 tablet by mouth daily.     saxagliptin  HCl (ONGLYZA) 2.5 MG TABS tablet Take 1 tablet (2.5 mg total) by mouth daily. 90 tablet 0   SUMAtriptan  (IMITREX ) 100 MG tablet TAKE 1 TABLET AT ONSET-MAY REPEAT IN 2 HOURS AS NEEDED- NO MORE THAN 2 IN 24 HOURS. (Patient taking differently: Take 100 mg by mouth every 2 (two) hours as needed for migraine.) 10 tablet 1    triamterene -hydrochlorothiazide  (MAXZIDE-25) 37.5-25 MG tablet Take 1 tablet by mouth daily. 90 tablet 3   valACYclovir  (VALTREX ) 500 MG tablet TAKE 1 TABLET BY MOUTH TWICE  DAILY FOR 5 DAYS AS NEEDED FOR  OUTBREAK 90 tablet 0   No current facility-administered medications on file prior to visit.   Social History   Socioeconomic History   Marital status: Divorced    Spouse name: Not on file   Number of children: 1   Years of education: masters   Highest education level: Master's degree (e.g., MA, MS, MEng, MEd, MSW, MBA)  Occupational History   Occupation: Engineer, maintenance (IT): [employer]  Tobacco Use   Smoking status: Never   Smokeless tobacco: Never  Vaping Use   Vaping status: Never Used  Substance and Sexual Activity   Alcohol use: Yes    Alcohol/week: 2.0 standard drinks of alcohol    Types: 2 Standard drinks or equivalent per week    Comment: a few drinks a week, weekend   Drug use: No   Sexual activity: Yes    Partners: Male    Birth control/protection: Surgical, Condom    Comment: hysterctomy  Other Topics Concern   Not on file  Social History Narrative   11/27/19   From: has been in [place]   Living: with son - Eva   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[place], 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      Family: Gilmer Eva - 1998, good relationship with parents   SO: Boyfriend - Darina - since 2020      Enjoys: walking with a friend, vacationing      Exercise: walking with friend   Diet: diabetic diet - tries to conscientious about diet      Safety   Seat belts: Yes    Guns: No   Safe in relationships: Yes          Social Drivers of Corporate investment banker Strain: Low Risk  (09/05/2023)   Overall Financial Resource Strain (CARDIA)    Difficulty of Paying Living Expenses: Not hard at all  Food Insecurity: No Food Insecurity (09/05/2023)   Hunger Vital Sign    Worried About Running Out of Food in the Last Year: Never true    Ran  Out of Food in the Last Year: Never true  Transportation Needs: No Transportation Needs (09/05/2023)   PRAPARE - Administrator, Civil Service (Medical): No    Lack of Transportation (Non-Medical): No  Physical Activity: Insufficiently Active (09/05/2023)   Exercise Vital Sign    Days of Exercise per Week: 2 days    Minutes of Exercise per Session: 20 min  Stress: Stress  Concern Present (09/05/2023)   [employer] of Occupational Health - Occupational Stress Questionnaire    Feeling of Stress : To some extent  Social Connections: Moderately Integrated (09/05/2023)   Social Connection and Isolation Panel    Frequency of Communication with Friends and Family: More than three times a week    Frequency of Social Gatherings with Friends and Family: Once a week    Attends Religious Services: More than 4 times per year    Active Member of [employer] or Organizations: No    Attends Engineer, structural: Not on file    Marital Status: Living with partner  Intimate Partner Violence: Not on file   Family History  Problem Relation Age of Onset   Diabetes Mother    Thyroid  disease Mother    Hypertension Mother    Diabetes Father    Colon cancer Father 109       2002   Hypertension Father    Stroke Father 92   Heart disease Father        stents   Dementia Father    Diabetes Brother    Thyroid  disease Brother    Cancer Paternal Grandfather        unknown type; d. 73   Breast cancer Maternal Aunt 50   Breast cancer Maternal Aunt        dx >50   Breast cancer Maternal Aunt        dx >50   Breast cancer Maternal Aunt        x3; first dx <50   Breast cancer Maternal Aunt 40   Breast cancer Maternal Aunt        dx >50   Stomach cancer Paternal Uncle 55   Breast cancer Cousin 54       x2 mat female cousins   Breast cancer Cousin 37       mat first cousin once removed; PALB2 positive   Colon polyps Neg Hx    Esophageal cancer Neg Hx    Rectal cancer Neg Hx    Allergies   Allergen Reactions   Mounjaro  [Tirzepatide ] Nausea And Vomiting    Stomach pains  Lost ten pounds   Prednisone  Other (See Comments)    Increased agitation    Synjardy  [Empagliflozin -Metformin  Hcl] Other (See Comments)    Abdominal upset      PE There were no vitals filed for this visit. There is no height or weight on file to calculate BMI.  Physical Exam    Assessment and Plan:        There are no diagnoses linked to this encounter. Clotilda FORBES Pa, CMA

## 2024-04-20 ENCOUNTER — Encounter: Payer: Self-pay | Admitting: Obstetrics and Gynecology

## 2024-04-20 ENCOUNTER — Ambulatory Visit (INDEPENDENT_AMBULATORY_CARE_PROVIDER_SITE_OTHER): Admitting: Obstetrics and Gynecology

## 2024-04-20 VITALS — BP 102/72 | HR 92 | Temp 98.4°F | Ht 66.5 in | Wt 159.0 lb

## 2024-04-20 DIAGNOSIS — Z1331 Encounter for screening for depression: Secondary | ICD-10-CM

## 2024-04-20 DIAGNOSIS — Z01419 Encounter for gynecological examination (general) (routine) without abnormal findings: Secondary | ICD-10-CM | POA: Diagnosis not present

## 2024-04-20 DIAGNOSIS — Z90710 Acquired absence of both cervix and uterus: Secondary | ICD-10-CM | POA: Insufficient documentation

## 2024-04-20 DIAGNOSIS — B009 Herpesviral infection, unspecified: Secondary | ICD-10-CM | POA: Insufficient documentation

## 2024-04-20 MED ORDER — VALACYCLOVIR HCL 500 MG PO TABS
ORAL_TABLET | ORAL | 3 refills | Status: AC
Start: 2024-04-20 — End: ?

## 2024-04-20 NOTE — Patient Instructions (Signed)

## 2024-04-20 NOTE — Assessment & Plan Note (Signed)

## 2024-05-03 DIAGNOSIS — M10471 Other secondary gout, right ankle and foot: Secondary | ICD-10-CM | POA: Diagnosis not present

## 2024-05-03 DIAGNOSIS — M25571 Pain in right ankle and joints of right foot: Secondary | ICD-10-CM | POA: Diagnosis not present

## 2024-05-04 ENCOUNTER — Other Ambulatory Visit: Payer: Self-pay | Admitting: Family

## 2024-05-04 ENCOUNTER — Ambulatory Visit: Payer: Self-pay | Admitting: Podiatry

## 2024-05-04 DIAGNOSIS — I10 Essential (primary) hypertension: Secondary | ICD-10-CM

## 2024-05-04 DIAGNOSIS — E1169 Type 2 diabetes mellitus with other specified complication: Secondary | ICD-10-CM

## 2024-05-04 LAB — URIC ACID: Uric Acid: 3.7 mg/dL (ref 3.0–7.2)

## 2024-05-04 LAB — BASIC METABOLIC PANEL WITH GFR
BUN/Creatinine Ratio: 19 (ref 9–23)
BUN: 19 mg/dL (ref 6–24)
CO2: 21 mmol/L (ref 20–29)
Calcium: 10.4 mg/dL — ABNORMAL HIGH (ref 8.7–10.2)
Chloride: 106 mmol/L (ref 96–106)
Creatinine, Ser: 0.98 mg/dL (ref 0.57–1.00)
Glucose: 106 mg/dL — ABNORMAL HIGH (ref 70–99)
Potassium: 4.3 mmol/L (ref 3.5–5.2)
Sodium: 144 mmol/L (ref 134–144)
eGFR: 68 mL/min/1.73 (ref >59)

## 2024-05-04 LAB — SEDIMENTATION RATE: Sed Rate: 2 mm/h (ref 0–40)

## 2024-05-18 ENCOUNTER — Other Ambulatory Visit: Payer: Self-pay

## 2024-05-18 ENCOUNTER — Other Ambulatory Visit (HOSPITAL_COMMUNITY): Payer: Self-pay

## 2024-05-18 MED FILL — Dapagliflozin Propanediol Tab 5 MG (Base Equivalent): ORAL | 30 days supply | Qty: 30 | Fill #0 | Status: CN

## 2024-05-29 ENCOUNTER — Other Ambulatory Visit (HOSPITAL_COMMUNITY): Payer: Self-pay

## 2024-06-07 ENCOUNTER — Other Ambulatory Visit (HOSPITAL_COMMUNITY): Payer: Self-pay

## 2024-06-07 MED FILL — Triamterene & Hydrochlorothiazide Tab 37.5-25 MG: ORAL | 60 days supply | Qty: 60 | Fill #0 | Status: AC

## 2024-06-07 MED FILL — Dapagliflozin Propanediol Tab 5 MG (Base Equivalent): ORAL | 30 days supply | Qty: 30 | Fill #0 | Status: AC

## 2024-06-07 MED FILL — Lisinopril Tab 40 MG: ORAL | 60 days supply | Qty: 60 | Fill #0 | Status: AC

## 2024-07-04 ENCOUNTER — Other Ambulatory Visit: Payer: Self-pay

## 2024-07-04 ENCOUNTER — Telehealth: Payer: Self-pay | Admitting: Podiatry

## 2024-07-04 ENCOUNTER — Ambulatory Visit (INDEPENDENT_AMBULATORY_CARE_PROVIDER_SITE_OTHER): Admitting: Podiatry

## 2024-07-04 VITALS — Ht 66.5 in | Wt 159.0 lb

## 2024-07-04 DIAGNOSIS — M205X1 Other deformities of toe(s) (acquired), right foot: Secondary | ICD-10-CM | POA: Diagnosis not present

## 2024-07-04 MED ORDER — DICLOFENAC SODIUM 75 MG PO TBEC: 75.0000 mg | DELAYED_RELEASE_TABLET | Freq: Two times a day (BID) | ORAL | 0 refills | Status: DC

## 2024-07-04 MED ORDER — DICLOFENAC SODIUM 75 MG PO TBEC
75.0000 mg | DELAYED_RELEASE_TABLET | Freq: Two times a day (BID) | ORAL | 0 refills | Status: AC
  Filled 2024-07-04 (×2): qty 60, 30d supply, fill #0

## 2024-07-04 NOTE — Telephone Encounter (Signed)
 Patient is currently at [facility] at [facility], Pharmacist did not receive order, please send as soon as possible

## 2024-07-05 ENCOUNTER — Encounter: Payer: Self-pay | Admitting: Podiatry

## 2024-07-05 NOTE — Progress Notes (Signed)
  Subjective:  Patient ID: Michelle Castillo, female    DOB: [birth year],  MRN: [id number]  Chief Complaint  Patient presents with   Foot Problem    Rm 6 Patient is here for a surgical consult for hallux limitus of the right foot.    Discussed the use of AI scribe software for clinical note transcription with the patient, who gave verbal consent to proceed.  History of Present Illness Michelle Castillo is a 58 year old female with type 2 diabetes who presents with right foot pain and swelling. She is accompanied by her partner who drove her to the appointment.  She returns for follow-up the injection only lasted for a very short period of time a couple weeks.  Still has quite a bit of pain over the joint worse with shoes.      Objective:    Physical Exam VASCULAR: DP and PT pulse palpable. Foot is warm and well-perfused. Capillary fill time is brisk. DERMATOLOGIC: Normal skin turgor, texture, and temperature. No open lesions, rashes, or ulcerations. NEUROLOGIC: Normal sensation to light touch and pressure. No paresthesias. ORTHOPEDIC: Right first MTP joint tender with dorsal bony prominence. Smooth pain-free range of motion of all other examined joints.  Her pain in the joint is at the end range of motion she has no pain in mid range of motion   No images are attached to the encounter.    Results   LABS A1c: 7  RADIOLOGY Right foot radiograph: Grade 2 hallux limitus with large spur dorsally and laterally, largely joint spaces maintained (02/20/2024)   Assessment:   1. Hallux limitus of right foot      Plan:  Patient was evaluated and treated and all questions answered.  Assessment and Plan Assessment & Plan Hallux limitus with arthritis She returns for follow-up today with her hallux limitus and dorsal spurring.  We discussed further treatment options her injection was relatively unsuccessful.  She is interested in proceeding with surgical correction.  Surgical we discussed  cheilectomy versus MTP fusion and the risks and benefits of each.  We discussed that a cheilectomy would likely not be a lifetime permanent surgery that may need further arthrodesis in the future.  She does have relatively well-maintained joint space on radiographs and decent range of motion most of her pain is at the end range of motion as the spurs contact 1 another.  I think cheilectomy is reasonable and we discussed approaching this from a minimally invasive approach.  We discussed the risk benefits and the recovery time.  All questions addressed.  Informed consent signed and reviewed.  Rx for diclofenac  sent to pharmacy for control until then   Surgical plan:  Procedure: - Right foot cheilectomy  Location: - [facility]  Anesthesia plan: - Sedation with local  Postoperative pain plan: - Tylenol  1000 mg every 6 hours, ibuprofen  600 mg every 6 hours, may be good candidate for Journavx  DVT prophylaxis: - None required  WB Restrictions / DME needs: - WBAT in surgical shoe postop       No follow-ups on file.

## 2024-07-11 ENCOUNTER — Ambulatory Visit: Admitting: Podiatry

## 2024-07-12 ENCOUNTER — Other Ambulatory Visit: Payer: Self-pay | Admitting: Family

## 2024-07-12 DIAGNOSIS — E1169 Type 2 diabetes mellitus with other specified complication: Secondary | ICD-10-CM

## 2024-07-13 ENCOUNTER — Telehealth: Payer: Self-pay | Admitting: Podiatry

## 2024-07-13 NOTE — Telephone Encounter (Signed)
 Called and scheduled patient for surgery on 08/10/2024. Patient is not on any GLP1 medicaitions or blood thinners. Patient requests medications after procedure got to [facility] pharmacy in [place]. Have set as preferred pharmacy in chart.

## 2024-07-18 ENCOUNTER — Telehealth: Payer: Self-pay | Admitting: Podiatry

## 2024-07-18 NOTE — Telephone Encounter (Signed)
 DOS- 08/10/2024  CHEILECTOMY RT- 71710  AETNA EFFECTIVE DATE- 12/29/2023  DEDUCTIBLE- $600 REMAINING- $319.79 OOP- $9200 REMAINING- $1268.68 COINSURANCE- 30%  PER AVAILITY PORTAL, NO PRIOR AUTH IS REQUIRED FOR CPT CODE 71710. DOCUMENTATION ATTACHED TO SURGERY CONSENT PACKET.

## 2024-07-23 ENCOUNTER — Ambulatory Visit: Admitting: Podiatry

## 2024-07-27 ENCOUNTER — Other Ambulatory Visit: Payer: Self-pay | Admitting: Family

## 2024-07-27 DIAGNOSIS — I10 Essential (primary) hypertension: Secondary | ICD-10-CM

## 2024-07-27 MED FILL — Dapagliflozin Propanediol Tab 5 MG (Base Equivalent): ORAL | 30 days supply | Qty: 30 | Fill #1 | Status: CN

## 2024-07-30 ENCOUNTER — Other Ambulatory Visit: Payer: Self-pay

## 2024-07-30 ENCOUNTER — Other Ambulatory Visit (HOSPITAL_COMMUNITY): Payer: Self-pay

## 2024-07-30 ENCOUNTER — Ambulatory Visit: Admitting: Family

## 2024-07-30 ENCOUNTER — Encounter: Payer: Self-pay | Admitting: Family

## 2024-07-30 VITALS — BP 122/84 | HR 84 | Temp 98.0°F | Wt 163.0 lb

## 2024-07-30 DIAGNOSIS — D500 Iron deficiency anemia secondary to blood loss (chronic): Secondary | ICD-10-CM | POA: Diagnosis not present

## 2024-07-30 DIAGNOSIS — I10 Essential (primary) hypertension: Secondary | ICD-10-CM

## 2024-07-30 DIAGNOSIS — E785 Hyperlipidemia, unspecified: Secondary | ICD-10-CM

## 2024-07-30 DIAGNOSIS — E1169 Type 2 diabetes mellitus with other specified complication: Secondary | ICD-10-CM

## 2024-07-30 DIAGNOSIS — Z7984 Long term (current) use of oral hypoglycemic drugs: Secondary | ICD-10-CM | POA: Diagnosis not present

## 2024-07-30 LAB — HEMOGLOBIN A1C: Hgb A1c MFr Bld: 6.9 % — ABNORMAL HIGH (ref 4.6–6.5)

## 2024-07-30 LAB — LIPID PANEL
Cholesterol: 140 mg/dL (ref 0–200)
HDL: 42.7 mg/dL (ref >39.00)
LDL Cholesterol: 72 mg/dL (ref 0–99)
NonHDL: 97.26
Total CHOL/HDL Ratio: 3
Triglycerides: 125 mg/dL (ref 0.0–149.0)
VLDL: 25 mg/dL (ref 0.0–40.0)

## 2024-07-30 LAB — VITAMIN D 25 HYDROXY (VIT D DEFICIENCY, FRACTURES): VITD: 23.05 ng/mL — ABNORMAL LOW (ref 30.00–100.00)

## 2024-07-30 LAB — MICROALBUMIN / CREATININE URINE RATIO
Creatinine,U: 54.6 mg/dL
Microalb Creat Ratio: UNDETERMINED mg/g (ref 0.0–30.0)
Microalb, Ur: <0.7 mg/dL

## 2024-07-30 MED ORDER — TRIAMTERENE-HCTZ 37.5-25 MG PO TABS
1.0000 | ORAL_TABLET | Freq: Every day | ORAL | 0 refills | Status: AC
  Filled 2024-07-30: qty 90, 90d supply, fill #0

## 2024-07-30 MED ORDER — BLOOD GLUCOSE MONITOR SYSTEM W/DEVICE KIT
1.0000 | PACK | Freq: Three times a day (TID) | 0 refills | Status: AC
  Filled 2024-07-30: qty 1, 30d supply, fill #0

## 2024-07-30 MED ORDER — LISINOPRIL 40 MG PO TABS
40.0000 mg | ORAL_TABLET | Freq: Every day | ORAL | 0 refills | Status: AC
  Filled 2024-07-30: qty 90, 90d supply, fill #0

## 2024-07-30 MED ORDER — GLUCOSE BLOOD VI STRP
ORAL_STRIP | 0 refills | Status: AC
  Filled 2024-07-30: qty 100, 90d supply, fill #0

## 2024-07-30 NOTE — Progress Notes (Unsigned)
 Established Patient Office Visit  Subjective:      CC:  Chief Complaint  Patient presents with   Diabetes    HPI: Michelle Castillo is a 58 y.o. female presenting on 07/30/2024 for Diabetes .  Discussed the use of AI scribe software for clinical note transcriptin with the patient, who gave verbal consent to proceed.  History of Present Illness Michelle Castillo is a 58 year old female who presents for a follow-up visit due to issues with her glucose meter and medication management.  She has been experiencing issues with her glucose meter, which displays an error code despite changing the batteries. Attempts to use her boyfriend's machine were unsuccessful due to the blood sample being too thin, resulting in another error code. She is concerned about her glucose levels as she is unsure of her current status.  She has been on metformin  for diabetes management for twenty years, taking her medications consistently in the morning but occasionally forgetting her evening dose if busy. She is also on Farxiga  and Onglyza for diabetes management.  Her migraines have improved significantly, with less frequent occurrences. She uses sumatriptan  as needed for migraine relief.  She is scheduled for foot surgery on December 12th due to a bone spur and anticipates needing joint infusions in the future.  Her mother's recent passing has been emotionally challenging, especially during the holiday season, impacting her ability to work. She has just returned to work after a six-week absence.     Lab Results  Component Value Date   HGBA1C 6.7 (A) 12/13/2023   Wt Readings from Last 3 Encounters:  07/30/24 163 lb (73.9 kg)  07/04/24 159 lb (72.1 kg)  04/20/24 159 lb (72.1 kg)            Social history:  Relevant past medical, surgical, family and social history reviewed and updated as indicated. Interim medical history since our last visit reviewed.  Allergies and medications reviewed and  updated.  DATA REVIEWED: CHART IN EPIC     ROS: Negative unless specifically indicated above in HPI.    Current Outpatient Medications:    Accu-Chek Softclix Lancets lancets, Use twice daily to check blood sugar, Disp: 100 each, Rfl: 11   atorvastatin  (LIPITOR) 10 MG tablet, Take 1 tablet by mouth once daily, Disp: 90 tablet, Rfl: 0   Blood Glucose Monitoring Suppl (BLOOD GLUCOSE MONITOR SYSTEM) w/Device KIT, Use as directed to check blood sugar in the morning, at noon, and at bedtime., Disp: 1 kit, Rfl: 0   dapagliflozin  propanediol (FARXIGA ) 5 MG TABS tablet, Take 1 tablet (5 mg total) by mouth daily before breakfast., Disp: 90 tablet, Rfl: 0   diclofenac  (VOLTAREN ) 75 MG EC tablet, Take 1 tablet (75 mg total) by mouth 2 (two) times daily., Disp: 60 tablet, Rfl: 0   dorzolamide-timolol (COSOPT) 22.3-6.8 MG/ML ophthalmic solution, Place 1 drop into both eyes 2 (two) times daily., Disp: , Rfl:    Glucose Blood (BLOOD GLUCOSE TEST STRIPS 333) STRP, 1 strip by In Vitro route daily as needed. Can change according to manufacturer covered under insurance, Disp: 100 strip, Rfl: 0   lisinopril  (ZESTRIL ) 40 MG tablet, Take 1 tablet by mouth once daily, Disp: 90 tablet, Rfl: 0   metFORMIN  (GLUCOPHAGE ) 1000 MG tablet, Take 1 tablet (1,000 mg total) by mouth 2 (two) times daily with a meal., Disp: 180 tablet, Rfl: 3   Multiple Vitamin (MULTIVITAMIN) tablet, Take 1 tablet by mouth daily., Disp: , Rfl:  saxagliptin  HCl (ONGLYZA) 2.5 MG TABS tablet, Take 1 tablet (2.5 mg total) by mouth daily. MUST HAVE OV FOR FURTHER REFILLS, Disp: 30 tablet, Rfl: 0   SUMAtriptan  (IMITREX ) 100 MG tablet, TAKE 1 TABLET AT ONSET-MAY REPEAT IN 2 HOURS AS NEEDED- NO MORE THAN 2 IN 24 HOURS. (Patient taking differently: Take 100 mg by mouth every 2 (two) hours as needed for migraine.), Disp: 10 tablet, Rfl: 1   triamterene -hydrochlorothiazide  (MAXZIDE -25) 37.5-25 MG tablet, Take 1 tablet by mouth daily., Disp: 90 tablet, Rfl:  0   valACYclovir  (VALTREX ) 500 MG tablet, TAKE 1 TABLET BY MOUTH TWICE  DAILY FOR 3 DAYS AS NEEDED FOR  OUTBREAK, Disp: 30 tablet, Rfl: 3        Objective:        BP 122/84 (BP Location: Left Arm, Patient Position: Sitting)   Pulse 84   Temp 98 F (36.7 C) (Temporal)   Wt 163 lb (73.9 kg)   LMP 12/29/2016   SpO2 98%   BMI 25.91 kg/m   Physical Exam   Wt Readings from Last 3 Encounters:  07/30/24 163 lb (73.9 kg)  07/04/24 159 lb (72.1 kg)  04/20/24 159 lb (72.1 kg)    Physical Exam Vitals reviewed.  Constitutional:      General: She is not in acute distress.    Appearance: Normal appearance. She is normal weight. She is not ill-appearing, toxic-appearing or diaphoretic.  HENT:     Head: Normocephalic.  Cardiovascular:     Rate and Rhythm: Normal rate and regular rhythm.  Pulmonary:     Effort: Pulmonary effort is normal.  Musculoskeletal:        General: Normal range of motion.  Neurological:     General: No focal deficit present.     Mental Status: She is alert and oriented to person, place, and time. Mental status is at baseline.  Psychiatric:        Mood and Affect: Mood normal.        Behavior: Behavior normal.        Thought Content: Thought content normal.        Judgment: Judgment normal.          Results LABS Uric acid: within normal limits Calcium : elevated  Assessment & Plan:   Assessment and Plan Assessment & Plan Type 2 diabetes mellitus with other specified complication Type 2 diabetes mellitus managed with metformin , Farxiga , and Onglyza. Blood glucose monitoring device malfunctioning, leading to inability to check blood glucose levels. A1c check is due. Metformin  is well-tolerated and effective in regulating diabetes without causing hypoglycemia. She is due for surgery on December 12th for a bone spur, and A1c needs to be optimized prior to surgery. - Sent new blood glucose monitoring device - Checked A1c today - Ensure adequate  supply of test strips - Monitor blood glucose levels regularly  Essential hypertension Lisinopril  as prescribed. - Continue lisinopril  as prescribed  Mixed hyperlipidemia associated with type 2 diabetes mellitus Mixed hyperlipidemia managed with atorvastatin . Cholesterol levels need to be checked to assess current status. - Checked cholesterol levels today  Hypercalcemia Calcium  levels slightly elevated but not clinically significant. No current supplementation with Tums or calcium . - Continue to monitor calcium  levels -order vitamin D        Return in about 6 months (around 01/28/2025) for f/u CPE.     Ginger Patrick, MSN, APRN, FNP-C [facility]

## 2024-07-31 ENCOUNTER — Other Ambulatory Visit (HOSPITAL_COMMUNITY): Payer: Self-pay

## 2024-07-31 ENCOUNTER — Ambulatory Visit: Payer: Self-pay | Admitting: Family

## 2024-07-31 DIAGNOSIS — E559 Vitamin D deficiency, unspecified: Secondary | ICD-10-CM

## 2024-07-31 DIAGNOSIS — E1169 Type 2 diabetes mellitus with other specified complication: Secondary | ICD-10-CM

## 2024-07-31 MED ORDER — DAPAGLIFLOZIN PROPANEDIOL 10 MG PO TABS
10.0000 mg | ORAL_TABLET | Freq: Every day | ORAL | 3 refills | Status: AC
  Filled 2024-07-31: qty 30, 30d supply, fill #0
  Filled 2024-08-29: qty 30, 30d supply, fill #1
  Filled 2024-10-05: qty 90, 90d supply, fill #0

## 2024-07-31 MED ORDER — CHOLECALCIFEROL 1.25 MG (50000 UT) PO CAPS
50000.0000 [IU] | ORAL_CAPSULE | ORAL | 0 refills | Status: AC
  Filled 2024-07-31: qty 12, 84d supply, fill #0

## 2024-08-01 ENCOUNTER — Other Ambulatory Visit (HOSPITAL_COMMUNITY): Payer: Self-pay

## 2024-08-01 MED ORDER — SAXAGLIPTIN HCL 2.5 MG PO TABS
2.5000 mg | ORAL_TABLET | Freq: Every day | ORAL | 3 refills | Status: AC
  Filled 2024-08-01: qty 90, 90d supply, fill #0

## 2024-08-02 ENCOUNTER — Other Ambulatory Visit (HOSPITAL_COMMUNITY): Payer: Self-pay

## 2024-08-06 ENCOUNTER — Other Ambulatory Visit: Payer: Self-pay | Admitting: Family

## 2024-08-06 ENCOUNTER — Other Ambulatory Visit (HOSPITAL_COMMUNITY): Payer: Self-pay

## 2024-08-06 DIAGNOSIS — Z0001 Encounter for general adult medical examination with abnormal findings: Secondary | ICD-10-CM

## 2024-08-06 DIAGNOSIS — E1169 Type 2 diabetes mellitus with other specified complication: Secondary | ICD-10-CM

## 2024-08-06 MED ORDER — METFORMIN HCL 1000 MG PO TABS
1000.0000 mg | ORAL_TABLET | Freq: Two times a day (BID) | ORAL | 3 refills | Status: AC
  Filled 2024-08-06 – 2024-09-07 (×3): qty 180, 90d supply, fill #0

## 2024-08-06 MED FILL — Atorvastatin Calcium Tab 10 MG (Base Equivalent): ORAL | 30 days supply | Qty: 30 | Fill #0 | Status: AC

## 2024-08-07 ENCOUNTER — Other Ambulatory Visit (HOSPITAL_COMMUNITY): Payer: Self-pay

## 2024-08-07 MED ORDER — SUZETRIGINE 50 MG PO TABS
ORAL_TABLET | ORAL | 0 refills | Status: AC
  Filled 2024-08-07: qty 29, 15d supply, fill #0

## 2024-08-09 ENCOUNTER — Other Ambulatory Visit (HOSPITAL_COMMUNITY): Payer: Self-pay

## 2024-08-10 DIAGNOSIS — M205X1 Other deformities of toe(s) (acquired), right foot: Secondary | ICD-10-CM

## 2024-08-16 ENCOUNTER — Ambulatory Visit

## 2024-08-16 ENCOUNTER — Ambulatory Visit (INDEPENDENT_AMBULATORY_CARE_PROVIDER_SITE_OTHER)

## 2024-08-16 VITALS — BP 137/82 | HR 84 | Temp 97.7°F

## 2024-08-16 DIAGNOSIS — M205X1 Other deformities of toe(s) (acquired), right foot: Secondary | ICD-10-CM | POA: Diagnosis not present

## 2024-08-16 NOTE — Progress Notes (Unsigned)
 Patient presents for post-op visit today, POV #1 DOS 08/10/2024 RT CHEILECTOMY  Overall has not been bad. I feel like something is going on with the stitches, like they are dissolving or something. Some itching. Other than that has not been that bad..  RN Notes: n/a  Vital Signs: Today's Vitals   08/16/24 1043  BP: 137/82  Pulse: 84  Temp: 97.7 F (36.5 C)  TempSrc: Oral  PainSc: 3   PainLoc: Foot      Radiographs: [x]  Taken []  Not taken  Surgical Site Assessment:  - Dressing:  [x]  Minimal dry blood, intact []  Reinforced   []  Changed     -RN Notes: dressing was coming off when patient arrived to office.   - Incision:  [x]  CDI (clean, dry, intact)  []  Mild erythema  []  Drainage noted   -RN Notes: n/a  - Swelling:  []  None  [x]  Mild  []  Moderate   []  Significant     -RN Notes: n/a  - Bruising:  []  None  [x]  Present: base of 1st hallux   - Sutures/Staples:  []  None [x]  Intact  []  Removed Today  [x]  Plan to remove at next visit   -Cast/Splint/Pins: [x]  None []  Intact []  Removed Today []  Plan to remove at next visit []  Replaced  -Signs of infection:  [x]  None  []  Present - Describe: n/a  -DME:    []  None []  AFW [x]  Surgical shoe []  Cast  []  Splint  -Walking status:  [x]  Full WB  []  Partial WB  []  NWB  -Utilizing device:  []  None []  Knee Scooter []  Crutches []  Wheelchair    DVT assessment:  [x]  Denies symptoms []  Chest pain/SOB []  Pain in calf/redness/warmth   Redressed DSD and ace wrap. Educated on signs of infection, proper dressing care, pain management, and weight bearing status. Patient will contact provider with any new or worsening symptoms. The provider assessed the patient today and reviewed instructions regarding plan of care.

## 2024-08-16 NOTE — Patient Instructions (Signed)

## 2024-08-19 NOTE — Progress Notes (Signed)
 Patient seen in conjunction with RN sutures are healing well, mild edema, okay to bathe the foot and return to regular shoe gear as tolerated encourage range of motion of the digit.  Follow-up in 2 weeks for suture removal

## 2024-08-28 ENCOUNTER — Ambulatory Visit (INDEPENDENT_AMBULATORY_CARE_PROVIDER_SITE_OTHER): Admitting: Podiatry

## 2024-08-28 DIAGNOSIS — M205X1 Other deformities of toe(s) (acquired), right foot: Secondary | ICD-10-CM

## 2024-08-28 NOTE — Progress Notes (Signed)
 Had some suture irritation on medial incision, there were no signs of acute infection, I applied Neosporin and Band-Aid recommended she do this for the next week.  All sutures removed regular shoe gear and activity as tolerated continue range of motion return in 3 weeks for final evaluation and x-rays.

## 2024-08-28 NOTE — Progress Notes (Signed)
 Patient presents for post-op visit today, POV #2 DOS 08/10/2024 RT CHEILECTOMY  I have been doing okay. I feel like the stitches are bothering me, my big toenail turned Michelle Castillo, looks like it bruised. My foot is still sore. The stitches are irritated, a little dry..  Notes: n/a  Vital Signs: Today's Vitals   08/28/24 9185  PainSc: 3   PainLoc: Foot      Radiographs: []  Taken [x]  Not taken  Surgical Site Assessment:  - Dressing:  []  Minimal dry blood, intact []  Reinforced   []  Changed     -Notes: No dressing in place  - Incision:  [x]  CDI (clean, dry, intact)  [x]  Mild erythema  []  Drainage noted   -Notes: medial incision appears red and irritated.  - Swelling:  []  None  [x]  Mild  []  Moderate   []  Significant     -Notes: n/a  - Bruising:  []  None  [x]  Present: 1st hallux nail   - Sutures/Staples:  []  None [x]  Intact  [x]  Removed Today  []  Plan to remove at next visit   -Cast/Splint/Pins: [x]  None []  Intact []  Removed Today []  Plan to remove at next visit []  Replaced  -Signs of infection:  [x]  None  []  Present - Describe: n/a  -DME:    [x]  None []  AFW []  Surgical shoe []  Cast  []  Splint  -Walking status:  [x]  Full WB  []  Partial WB  []  NWB  -Utilizing device:  [x]  None []  Knee Scooter []  Crutches []  Wheelchair    DVT assessment:  [x]  Denies symptoms []  Chest pain/SOB []  Pain in calf/redness/warmth   Redressed DSD and ace wrap. Educated on signs of infection, proper dressing care, pain management, and weight bearing status. Patient will contact provider with any new or worsening symptoms. The provider assessed the patient today and reviewed instructions regarding plan of care.

## 2024-08-29 ENCOUNTER — Other Ambulatory Visit (HOSPITAL_COMMUNITY): Payer: Self-pay

## 2024-09-07 ENCOUNTER — Encounter (HOSPITAL_COMMUNITY): Payer: Self-pay | Admitting: Pharmacist

## 2024-09-07 ENCOUNTER — Other Ambulatory Visit: Payer: Self-pay

## 2024-09-07 ENCOUNTER — Other Ambulatory Visit (HOSPITAL_COMMUNITY): Payer: Self-pay

## 2024-09-07 ENCOUNTER — Other Ambulatory Visit: Payer: Self-pay | Admitting: Family

## 2024-09-07 DIAGNOSIS — E1169 Type 2 diabetes mellitus with other specified complication: Secondary | ICD-10-CM

## 2024-09-07 MED ORDER — ATORVASTATIN CALCIUM 10 MG PO TABS
10.0000 mg | ORAL_TABLET | Freq: Every day | ORAL | 0 refills | Status: AC
  Filled 2024-09-07 (×2): qty 90, 90d supply, fill #0

## 2024-09-10 ENCOUNTER — Other Ambulatory Visit (HOSPITAL_COMMUNITY): Payer: Self-pay

## 2024-09-11 ENCOUNTER — Other Ambulatory Visit (HOSPITAL_COMMUNITY): Payer: Self-pay

## 2024-09-18 ENCOUNTER — Encounter: Payer: Self-pay | Admitting: Podiatry

## 2024-09-18 ENCOUNTER — Ambulatory Visit: Admitting: Podiatry

## 2024-09-18 ENCOUNTER — Ambulatory Visit

## 2024-09-18 DIAGNOSIS — M205X1 Other deformities of toe(s) (acquired), right foot: Secondary | ICD-10-CM

## 2024-09-18 NOTE — Patient Instructions (Signed)
Call to schedule physical therapy: [facility] at [place] [address]  [phone]  

## 2024-09-20 ENCOUNTER — Encounter: Payer: Self-pay | Admitting: Podiatry

## 2024-09-20 NOTE — Progress Notes (Signed)
"   °  Subjective:  Patient ID: Michelle Castillo, female    DOB: [birth year],  MRN: [id number]  Chief Complaint  Patient presents with   Routine Post Op    POV #3 DOS 08/10/2024 RT CHEILECTOMY It's stills sore.  It's still hard for me to wear a dress shoe.  The other day, I was getting shooting pains in it, off and on.       59 y.o. female returns for post-op check.    Review of Systems: Negative except as noted in the HPI. Denies N/V/F/Ch.   Objective:  There were no vitals filed for this visit. There is no height or weight on file to calculate BMI. Constitutional Well developed. Well nourished.  Vascular Foot warm and well perfused. Capillary refill normal to all digits.  Calf is soft and supple, no posterior calf or knee pain, negative Homans' sign  Neurologic Normal speech. Oriented to person, place, and time. Epicritic sensation to light touch grossly present bilaterally.  Dermatologic Her incision is well-healed with minimal hypertrophy  Orthopedic: Tenderness to palpation noted about the surgical site.  Mild to moderate edema   Multiple view plain film radiographs: Maintained spur resection, no notable bony lesions in the area of concern increase soft tissue density around the joint Assessment:   1. Hallux limitus of right foot    Plan:  Patient was evaluated and treated and all questions answered.  S/p foot surgery right - Dealing with some scar tissue and scar sensitivity.  Recommended physical therapy for joint mobilization and scar desensitization.  Referral placed.  Regular shoe gear and activity as tolerated encourage range of motion of the toe.  Return in 2 months to reevaluate.  If not improved will consider steroid injection at this point  Return in about 2 months (around 11/16/2024) for f/u from [facility] after PT (new R foot XR).  "

## 2024-09-27 ENCOUNTER — Ambulatory Visit

## 2024-10-04 ENCOUNTER — Ambulatory Visit

## 2024-10-05 ENCOUNTER — Other Ambulatory Visit: Payer: Self-pay

## 2024-10-09 ENCOUNTER — Ambulatory Visit

## 2024-11-15 ENCOUNTER — Ambulatory Visit: Admitting: Podiatry
